# Patient Record
Sex: Male | Born: 1955 | Race: White | Hispanic: No | Marital: Married | Smoking: Never smoker
Health system: Southern US, Community
[De-identification: ages and names within clinical notes are randomized; demographics above are authoritative.]

## PROBLEM LIST (undated history)

## (undated) DIAGNOSIS — Z9889 Other specified postprocedural states: Secondary | ICD-10-CM

## (undated) DIAGNOSIS — I2510 Atherosclerotic heart disease of native coronary artery without angina pectoris: Secondary | ICD-10-CM

## (undated) DIAGNOSIS — K219 Gastro-esophageal reflux disease without esophagitis: Secondary | ICD-10-CM

## (undated) DIAGNOSIS — M1990 Unspecified osteoarthritis, unspecified site: Secondary | ICD-10-CM

## (undated) DIAGNOSIS — T8859XA Other complications of anesthesia, initial encounter: Secondary | ICD-10-CM

## (undated) DIAGNOSIS — T4145XA Adverse effect of unspecified anesthetic, initial encounter: Secondary | ICD-10-CM

## (undated) DIAGNOSIS — E669 Obesity, unspecified: Secondary | ICD-10-CM

## (undated) DIAGNOSIS — G4733 Obstructive sleep apnea (adult) (pediatric): Secondary | ICD-10-CM

## (undated) DIAGNOSIS — R112 Nausea with vomiting, unspecified: Secondary | ICD-10-CM

## (undated) DIAGNOSIS — C801 Malignant (primary) neoplasm, unspecified: Secondary | ICD-10-CM

## (undated) DIAGNOSIS — K5520 Angiodysplasia of colon without hemorrhage: Secondary | ICD-10-CM

## (undated) DIAGNOSIS — T7840XA Allergy, unspecified, initial encounter: Secondary | ICD-10-CM

## (undated) DIAGNOSIS — E119 Type 2 diabetes mellitus without complications: Secondary | ICD-10-CM

## (undated) DIAGNOSIS — J45909 Unspecified asthma, uncomplicated: Secondary | ICD-10-CM

## (undated) DIAGNOSIS — I10 Essential (primary) hypertension: Secondary | ICD-10-CM

## (undated) DIAGNOSIS — K580 Irritable bowel syndrome with diarrhea: Secondary | ICD-10-CM

## (undated) HISTORY — DX: Irritable bowel syndrome with diarrhea: K58.0

## (undated) HISTORY — PX: SHOULDER ARTHROSCOPY: SHX128

## (undated) HISTORY — DX: Obesity, unspecified: E66.9

## (undated) HISTORY — PX: JOINT REPLACEMENT: SHX530

## (undated) HISTORY — DX: Allergy, unspecified, initial encounter: T78.40XA

## (undated) HISTORY — DX: Atherosclerotic heart disease of native coronary artery without angina pectoris: I25.10

## (undated) HISTORY — PX: COLONOSCOPY: SHX174

## (undated) HISTORY — DX: Unspecified asthma, uncomplicated: J45.909

## (undated) HISTORY — PX: ESOPHAGEAL DILATION: SHX303

## (undated) HISTORY — DX: Essential (primary) hypertension: I10

## (undated) HISTORY — DX: Obstructive sleep apnea (adult) (pediatric): G47.33

## (undated) HISTORY — DX: Angiodysplasia of colon without hemorrhage: K55.20

---

## 1998-06-04 ENCOUNTER — Ambulatory Visit: Admission: RE | Admit: 1998-06-04 | Discharge: 1998-06-04 | Payer: Self-pay | Admitting: Family Medicine

## 1999-02-19 ENCOUNTER — Observation Stay (HOSPITAL_COMMUNITY): Admission: RE | Admit: 1999-02-19 | Discharge: 1999-02-19 | Payer: Self-pay | Admitting: Orthopedic Surgery

## 1999-04-08 ENCOUNTER — Inpatient Hospital Stay (HOSPITAL_COMMUNITY): Admission: EM | Admit: 1999-04-08 | Discharge: 1999-04-09 | Payer: Self-pay | Admitting: Orthopedic Surgery

## 1999-11-23 ENCOUNTER — Inpatient Hospital Stay (HOSPITAL_COMMUNITY): Admission: RE | Admit: 1999-11-23 | Discharge: 1999-11-25 | Payer: Self-pay | Admitting: Orthopedic Surgery

## 1999-11-24 ENCOUNTER — Encounter: Payer: Self-pay | Admitting: General Surgery

## 2001-02-03 ENCOUNTER — Encounter: Admission: RE | Admit: 2001-02-03 | Discharge: 2001-02-03 | Payer: Self-pay | Admitting: Gastroenterology

## 2001-02-03 ENCOUNTER — Encounter: Payer: Self-pay | Admitting: Gastroenterology

## 2001-07-04 ENCOUNTER — Ambulatory Visit (HOSPITAL_COMMUNITY): Admission: RE | Admit: 2001-07-04 | Discharge: 2001-07-04 | Payer: Self-pay | Admitting: Orthopedic Surgery

## 2001-07-04 ENCOUNTER — Encounter (INDEPENDENT_AMBULATORY_CARE_PROVIDER_SITE_OTHER): Payer: Self-pay | Admitting: Specialist

## 2002-05-18 ENCOUNTER — Ambulatory Visit (HOSPITAL_COMMUNITY): Admission: RE | Admit: 2002-05-18 | Discharge: 2002-05-18 | Payer: Self-pay | Admitting: Gastroenterology

## 2002-05-18 ENCOUNTER — Encounter: Payer: Self-pay | Admitting: Gastroenterology

## 2003-10-31 ENCOUNTER — Encounter: Admission: RE | Admit: 2003-10-31 | Discharge: 2003-10-31 | Payer: Self-pay | Admitting: Specialist

## 2003-11-21 ENCOUNTER — Ambulatory Visit (HOSPITAL_BASED_OUTPATIENT_CLINIC_OR_DEPARTMENT_OTHER): Admission: RE | Admit: 2003-11-21 | Discharge: 2003-11-21 | Payer: Self-pay | Admitting: Orthopedic Surgery

## 2006-05-30 ENCOUNTER — Inpatient Hospital Stay (HOSPITAL_COMMUNITY): Admission: RE | Admit: 2006-05-30 | Discharge: 2006-06-02 | Payer: Self-pay | Admitting: Orthopedic Surgery

## 2009-11-03 ENCOUNTER — Inpatient Hospital Stay (HOSPITAL_COMMUNITY): Admission: RE | Admit: 2009-11-03 | Discharge: 2009-11-06 | Payer: Self-pay | Admitting: Orthopedic Surgery

## 2010-06-01 ENCOUNTER — Encounter: Admission: RE | Admit: 2010-06-01 | Discharge: 2010-06-01 | Payer: Self-pay | Admitting: Surgery

## 2010-06-03 ENCOUNTER — Ambulatory Visit (HOSPITAL_BASED_OUTPATIENT_CLINIC_OR_DEPARTMENT_OTHER): Admission: RE | Admit: 2010-06-03 | Discharge: 2010-06-03 | Payer: Self-pay | Admitting: Surgery

## 2010-07-26 ENCOUNTER — Encounter: Payer: Self-pay | Admitting: Specialist

## 2010-09-15 LAB — DIFFERENTIAL
Basophils Relative: 0 % (ref 0–1)
Eosinophils Absolute: 0.3 10*3/uL (ref 0.0–0.7)
Lymphocytes Relative: 28 % (ref 12–46)
Lymphs Abs: 1.9 10*3/uL (ref 0.7–4.0)
Monocytes Absolute: 0.5 10*3/uL (ref 0.1–1.0)
Monocytes Relative: 8 % (ref 3–12)
Neutro Abs: 4.1 10*3/uL (ref 1.7–7.7)
Neutrophils Relative %: 60 % (ref 43–77)

## 2010-09-15 LAB — COMPREHENSIVE METABOLIC PANEL
ALT: 27 U/L (ref 0–53)
BUN: 14 mg/dL (ref 6–23)
Calcium: 9.3 mg/dL (ref 8.4–10.5)
Chloride: 104 mEq/L (ref 96–112)
Creatinine, Ser: 0.77 mg/dL (ref 0.4–1.5)
GFR calc Af Amer: >60 mL/min (ref >60)
Sodium: 140 mEq/L (ref 135–145)

## 2010-09-15 LAB — CBC
HCT: 45.5 % (ref 39.0–52.0)
Hemoglobin: 15.3 g/dL (ref 13.0–17.0)
MCHC: 33.6 g/dL (ref 30.0–36.0)
RBC: 5.23 MIL/uL (ref 4.22–5.81)
WBC: 6.8 10*3/uL (ref 4.0–10.5)

## 2010-09-22 LAB — CBC
HCT: 32.5 % — ABNORMAL LOW (ref 39.0–52.0)
HCT: 34.6 % — ABNORMAL LOW (ref 39.0–52.0)
HCT: 47 % (ref 39.0–52.0)
Hemoglobin: 11.7 g/dL — ABNORMAL LOW (ref 13.0–17.0)
Hemoglobin: 15.8 g/dL (ref 13.0–17.0)
MCHC: 33.9 g/dL (ref 30.0–36.0)
MCV: 90.4 fL (ref 78.0–100.0)
MCV: 90.6 fL (ref 78.0–100.0)
MCV: 89.7 fL (ref 78.0–100.0)
Platelets: 154 10*3/uL (ref 150–400)
RBC: 3.82 MIL/uL — ABNORMAL LOW (ref 4.22–5.81)
RBC: 4.08 MIL/uL — ABNORMAL LOW (ref 4.22–5.81)
RDW: 13.1 % (ref 11.5–15.5)
RDW: 13.2 % (ref 11.5–15.5)
WBC: 11.6 10*3/uL — ABNORMAL HIGH (ref 4.0–10.5)
WBC: 7.3 10*3/uL (ref 4.0–10.5)

## 2010-09-22 LAB — PROTIME-INR
INR: 1.14 (ref 0.00–1.49)
INR: 1.84 — ABNORMAL HIGH (ref 0.00–1.49)
INR: 0.95 (ref 0.00–1.49)
Prothrombin Time: 14.5 seconds (ref 11.6–15.2)
Prothrombin Time: 17.9 seconds — ABNORMAL HIGH (ref 11.6–15.2)
Prothrombin Time: 12.6 seconds (ref 11.6–15.2)

## 2010-09-22 LAB — COMPREHENSIVE METABOLIC PANEL
ALT: 31 U/L (ref 0–53)
BUN: 12 mg/dL (ref 6–23)
CO2: 26 mEq/L (ref 19–32)
Calcium: 9.3 mg/dL (ref 8.4–10.5)
Chloride: 107 mEq/L (ref 96–112)
Creatinine, Ser: 0.75 mg/dL (ref 0.4–1.5)
GFR calc non Af Amer: >60 mL/min (ref >60)

## 2010-09-22 LAB — BASIC METABOLIC PANEL
BUN: 10 mg/dL (ref 6–23)
CO2: 26 mEq/L (ref 19–32)
Calcium: 8.3 mg/dL — ABNORMAL LOW (ref 8.4–10.5)
Chloride: 104 mEq/L (ref 96–112)
Creatinine, Ser: 0.74 mg/dL (ref 0.4–1.5)
Creatinine, Ser: 0.76 mg/dL (ref 0.4–1.5)
GFR calc Af Amer: >60 mL/min (ref >60)
GFR calc non Af Amer: >60 mL/min (ref >60)
Potassium: 4.1 mEq/L (ref 3.5–5.1)

## 2010-09-22 LAB — URINALYSIS, ROUTINE W REFLEX MICROSCOPIC
Bilirubin Urine: NEGATIVE
Glucose, UA: NEGATIVE mg/dL
Glucose, UA: NEGATIVE mg/dL
Hgb urine dipstick: NEGATIVE
Ketones, ur: NEGATIVE mg/dL
Nitrite: NEGATIVE
Protein, ur: NEGATIVE mg/dL
Specific Gravity, Urine: 1.023 (ref 1.005–1.030)
Urobilinogen, UA: 0.2 mg/dL (ref 0.0–1.0)
Urobilinogen, UA: 0.2 mg/dL (ref 0.0–1.0)

## 2010-09-22 LAB — APTT: aPTT: 26 seconds (ref 24–37)

## 2010-09-22 LAB — TYPE AND SCREEN: ABO/RH(D): O POS

## 2010-09-22 LAB — URINE MICROSCOPIC-ADD ON

## 2010-11-09 ENCOUNTER — Other Ambulatory Visit: Payer: Self-pay | Admitting: Gastroenterology

## 2010-11-20 NOTE — Op Note (Signed)
[facility]  Patient:    Jordan Mcconnell, Jordan Mcconnell Visit Number: [id number] MRN: [id number]          Service Type: DSU Location: DAY Attending Physician:  Marlowe Kays Page Dictated by:   Illene Labrador. Aplington, M.D. Proc. Date: 07/04/01 Admit Date:  07/04/2001                             Operative Report  PREOPERATIVE DIAGNOSES:  1. 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  2. Chronic plantar fasciitis right foot.  POSTOPERATIVE DIAGNOSES:  1. 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  2. Chronic plantar fasciitis right foot.  OPERATION PERFORMED:  1. 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  2. Reduce plantar fascia heel, right foot.  SURGEON:  Illene Labrador. Aplington, M.D.  ASSISTANT:  Nurse.  ANESTHESIA:  General.  PATHOLOGY AND JUSTIFICATION FOR PROCEDURE:  He has had the two above problems which have failed to respond to nonsurgical treatment and consequently he is here today for the above mentioned surgery.  DESCRIPTION OF PROCEDURE:  Satisfactory general anesthesia, pneumatic tourniquet, duraprep the foot and ankle which was draped in a sterile field. I first made a 2 cm incision over the posterior longitudinal arch of the foot overlying the heel medially. The plantar fascia was identified and delineate from the underlying soft tissue. Superiorly I also delineated it from the overlying muscle. With small scissors, I then released a very thickened plantar fascia across the width of the heel until both ______ with the freer elevator and by admitting my finger into the released area and no residual bands of plantar fascia could be felt. The wound was then irrigated with sterile saline and the soft tissue was infiltrated with 0.5% plain Marcaine. The subcutaneous tissue was closed with interrupted 3-0 Vicryl and the skin with interrupted 4-0 nylon mattress sutures. I then made a dorsal web splitting incision between the second and  third toes and with blunt dissection isolated the very scarred common digital nerve and its two branches. Remnants of steroid material were present. The whole area was a mass of congealed tissue. I grasped this congealed tissue with Allis clamp and then began dissecting out soft tissue distally to each tail using a combination of Bovie and small scissors. I then freed up the mass of tissue between the metatarsal heads and excised it at as far proximally as possible with the cutting cautery. I then released the tourniquet. There was very minimal bleeding and all bleeders that were discernible were coagulated. I then irrigated the wound well and placed Gelfoam in the base of the wound and infiltrated the soft tissue with 0.5% plain Marcaine once again. The subcutaneous tissue was closed with interrupted 3-0 Vicryl and skin with interrupted 4-0 nylon mattress sutures. Betadine Adaptic dry sterile dressing were applied. The patient tolerated the procedure well and was taken to the recovery room in satisfactory condition with no known complications. Dictated by:   Illene Labrador. Aplington, M.D. Attending Physician:  Joaquin Courts DD:  07/04/01 TD:  07/04/01 Job: [id number]

## 2010-11-20 NOTE — Op Note (Signed)
NAME:  Jordan Mcconnell, Jordan Mcconnell NO.:  [id number]   MEDICAL RECORD NO.:  [id number]          PATIENT TYPE:  INP   LOCATION:  0009                         FACILITY:  [facility]   PHYSICIAN:  Ollen Gross, M.D.    DATE OF BIRTH:  1955/08/19   DATE OF PROCEDURE:  05/30/2006  DATE OF DISCHARGE:                               OPERATIVE REPORT   PREOPERATIVE DIAGNOSIS:  Osteoarthritis right knee.   POSTOP DIAGNOSIS:  Osteoarthritis right knee.   PROCEDURE:  Right total knee arthroplasty.   SURGEON:  Dr. Lequita Halt   ASSISTANT:  Alexzandrew L. Julien Girt, P.A.   ANESTHESIA:  General with postop Marcaine pain pump.   ESTIMATED BLOOD LOSS:  Minimal.   DRAINS:  Hemovac x1.   TOURNIQUET TIME:  36 minutes at 300 mmHg.   COMPLICATIONS:  None.   CONDITION:  Stable to recovery.   CLINICAL NOTE:  Jordan Mcconnell is a 55 year old male with severe end-stage  arthritis of the left knee with progressively worsening symptoms.  He  has failed nonoperative management and presents now for total knee  arthroplasty.   PROCEDURE IN DETAIL:  After the successful initiation of general  anesthetic a tourniquet placed high on the right thigh and right lower  extremity prepped and draped in the usual sterile fashion.  Extremities  wrapped in Esmarch, knee flexed, tourniquet inflated to 300 mmHg.  Midline incision made with 10 blade through subcutaneous tissue to the  level of the extensor mechanism.  Fresh blade is used to make a medial  parapatellar arthrotomy and the soft tissue over the proximal medial  tibia subperiosteally elevated to the joint line with a knife and into  the semimembranosus bursa with a Cobb elevator.  Soft tissue laterally  is elevated with attention being paid to avoid the patellar tendon on  tibial tubercle.  The patella subluxed laterally, knee flexed 90  degrees, ACL and PCL removed.  Drill was used to create a starting hole  in the distal femur and canal was thoroughly irrigated.   The 5 degrees  right valgus alignment guide is placed and referencing off the posterior  condyles rotations marked and the block pinned to remove 11 mm of the  distal femur as he had a flexion contracture.  The distal femoral  resection is made with an oscillating saw.  Sizing blocks placed, size 4  is most appropriate.  The size 4 cutting block is placed with rotation  marked off the epicondylar axis.  The anterior-posterior chamfer cuts  were then made.   The tibia subluxed forward and the menisci are removed.  The  extramedullary tibial alignment guide is placed referencing proximally  at the medial aspect of the tibial tubercle and distally along the  second metatarsal axis and tibial crest.  The block is pinned to remove  10 mm off the non deficient lateral side.  Tibial resection is made with  an oscillating saw.  The size 4 is the most appropriate tibial component  and the proximal tibia is prepared with modular drill and keel punch for  size 4.  The femoral preparation is then  completed with the  intercondylar cut.   The size 4 mobile bearing tibial trial, size 4 posterior stabilized  femoral trial, and a 10 mm posterior stabilized rotating platform insert  trial placed.  With a 10 full extension was achieved with excellent  varus and valgus balance throughout full range of motion.  The patella  was then everted and thickness measured to be 24 mm.  Freehand resection  is taken to 13 mm, 41 template is placed, lug holes were drilled, trial  patella was placed and it tracks normally.  Osteophytes were then  removed off the posterior femur with the trial in place.  All trials  removed and the cut bone surfaces are prepared with pulsatile lavage.  Cement was mixed and once ready for implantation the size 4 mobile  bearing tibial tray, size 4 posterior stabilized femur, and 41 patella  are cemented into place. The patella is held with the clamp.  Trial 10-  mm insert is placed, knee  held in full extension and all extruded cement  removed.  Once the cement fully hardened then the permanent 10 mm  posterior stabilized rotating platform insert is placed into the tibial  tray.  Wound was copiously irrigated with saline solution and the  extensor mechanism closed over Hemovac drain with interrupted #1 PDS.  Flexion against gravity to 135 degrees.  Tourniquet released with total  time of 36 minutes.  Subcu is then closed interrupted 2-0 Vicryl and  subcuticular running 4-0 Monocryl.  Catheter for Marcaine pain pump is  placed and the pump initiated.  The Hemovac then hooked to suction.  Steri-Strips and a bulky sterile dressing are applied.  He is then  placed into a knee immobilizer, awakened and transferred to recovery in  stable condition.      Ollen Gross, M.D.  Electronically Signed     FA/MEDQ  D:  05/30/2006  T:  05/30/2006  Job:  [id number]

## 2010-11-20 NOTE — Discharge Summary (Signed)
[facility]  Patient:    NIZAR, CUTLER                     MRN: [id number] Adm. Date:  [id number] Disc. Date: [id number] Attending:  Marlowe Kays Page Dictator:   Ralene Bathe, P.A. CC:         Gita Kudo, M.D.             Dr. Annitta Needs                           Discharge Summary  ADMISSION DIAGNOSIS:  Tear of right biceps tendon.  DISCHARGE DIAGNOSES: 1. Status post right biceps tendon repair. 2. 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  CONSULTS:  Gita Kudo, M.D., general surgery.  OPERATIONS:  Repair of right biceps tendon.  Surgeon:  Illene Labrador. Aplington, M.D.  Assistant:  Grayland Jack, P.A.  Under general anesthesia.  HISTORY OF PRESENT ILLNESS:  This is a 55 year old male who had an initial right rotator cuff tear some time ago.  Noted to have a rupture of the long hand of the right biceps tendon, which was repaired.  He has since gone on to rerupture that biceps tendon.  This would be from the distal attachment.  At this time, we have indicated to have surgical repair of the right biceps tendon.  HOSPITAL COURSE:  The patient was admitted, underwent the above-named procedure, and tolerated this well.  All appropriate IV antibiotics and analgesics were provided.  Postoperatively, the patient was placed in a posterior splint and on all appropriate IV antibiotics and analgesics.  Of note, Illene Labrador. Aplington, M.D., noticed an abnormally large thyroid on his preoperative exam and consulted Gita Kudo, M.D., for general surgery for evaluation.  An ultrasound was ordered, which did suggest an adenoma of the right lobe.  This was about 4 mm in size.  Dr. Maryagnes Amos reviewed and felt that no surgical treatment was indicated, however, a thyroid panel should be ordered.  This can be done on an outpatient basis.  On Nov 25, 1999, he was doing much better with his pain control.  He was afebrile, vital signs were stable, and  neurovascularly intact to the right lower extremity.  He was felt to be medically stable for discharge to home.  LABORATORY DATA:  Laboratories not found in the chart, x-rays not found in the chart, and EKG not found in the chart at the time of dictation.  CONDITION ON DISCHARGE:  Stable and improved.  DISPOSITION:  The patient is being discharged to home to the care of his family.  FOLLOW-UP:  He will follow up in 10 days; call for a time.  SPECIAL INSTRUCTIONS:  Keep the posterior splint on.  May change the proximal dressing p.r.n.  Will order a TSH and a free T4 prior to his discharge and have the results sent to Dr. Annitta Needs and Dr. Maryagnes Amos.  Will also have ultrasound report sent to both offices.  DISCHARGE MEDICATIONS:  Prescriptions are given for Percocet #40 one to two every four to six hours p.r.n. pain and Robaxin 500 mg one every eight hours p.r.n. spasm.  Resume his home medications.  DIET:  Resume his home diet. DD:  11/25/99 TD:  11/28/99 Job: [id number]

## 2010-11-20 NOTE — H&P (Signed)
NAME:  Jordan Mcconnell, Jordan Mcconnell NO.:  [id number]   MEDICAL RECORD NO.:  [id number]          PATIENT TYPE:  INP   LOCATION:  NA                           FACILITY:  [facility]   PHYSICIAN:  Ollen Gross, M.D.    DATE OF BIRTH:  07-25-1955   DATE OF ADMISSION:  05/30/2006  DATE OF DISCHARGE:                              HISTORY & PHYSICAL   Date of office visit, history and physical May 24, 2006.   CHIEF COMPLAINT:  Right knee pain.   HISTORY OF PRESENT ILLNESS:  The patient is a 55 year old male who has  been by Dr. Lequita Halt for ongoing progressive problems with regards to his  right knee.  It has progressively gotten worse.  He is known to have  significant arthritis found in the office.  He has reached a point where  he could benefit from undergoing surgical intervention.  Risks and  benefits have been discussed and the patient has elected to proceed with  surgery.   ALLERGIES:  PENICILLIN childhood reaction.   CURRENT MEDICATIONS:  None.   PAST MEDICAL HISTORY:  1. Borderline hypertension.  2. Sleep apnea but does not require a machine.   PAST SURGICAL HISTORY:  He has had three right shoulder surgeries, three  left shoulder surgeries and also two surgeries on his knee for meniscal  tears.   FAMILY HISTORY:  Father is 86 with history of colon cancer.  Grandfather  with lung cancer.  Grandmother with asthma.  Mother living at age 75  healthy.   SOCIAL HISTORY:  Married, two children.  Denies use of tobacco products  or alcohol products.   REVIEW OF SYSTEMS:  GENERAL:  No fever, chills or night sweats.  NEUROLOGICAL:  No seizures, syncope or paralysis.  RESPIRATORY:  No  shortness of breath, productive cough or hemoptysis.  CARDIOVASCULAR:  No chest pain, angina or orthopnea.  GI:  No nausea, vomiting, diarrhea  or constipation.  GU:  No dysuria, hematuria or discharge.  MUSCULOSKELETAL:  Right knee.   PHYSICAL EXAMINATION:  VITAL SIGNS:  Pulse 68,  respirations 12, blood  pressure 130/80.  GENERAL:  A 55 year old white male well-developed, well-nourished in no  acute distress.  Good historian, alert, oriented and cooperative.  HEENT:  Normocephalic and atraumatic.  Pupils are round and reactive.  Oropharynx clear.  EOMs intact.  Neck is supple.  CHEST:  Clear anterior and posterior chest wall.  HEART:  Regular rhythm.  No murmur.  ABDOMEN:  Soft and nontender.  Bowel sounds present.  RECTAL/BREASTS/GENITALIA:  Not done, not pertinent to present illness.  EXTREMITIES:  No instability.  Marked crepitus noted on passive range of  motion.  Tender to palpation medial and lateral.   IMPRESSION:  1. Osteoarthritis right knee.  2. Borderline hypertension.  3. Sleep apnea but does not require a machine.   PLAN:  The patient is admitted to [facility] to undergo right  total knee arthroplasty.  Surgery will be performed by Dr. Ollen Gross.      Alexzandrew L. Julien Girt, P.A.      Ollen Gross, M.D.  Electronically Signed    ALP/MEDQ  D:  05/29/2006  T:  05/29/2006  Job:  [id number]

## 2010-11-20 NOTE — Op Note (Signed)
NAME:  Jordan Mcconnell, Jordan Mcconnell                        ACCOUNT NO.:  [id number]   MEDICAL RECORD NO.:  [id number]                   PATIENT TYPE:  AMB   LOCATION:  NESC                                 FACILITY:  [facility]   PHYSICIAN:  Marlowe Kays, M.D.               DATE OF BIRTH:  [birth year]   DATE OF PROCEDURE:  11/20/2003  DATE OF DISCHARGE:                                 OPERATIVE REPORT   PREOPERATIVE DIAGNOSES:  1. Torn medial meniscus.  .  2. Degenerative arthritis.  3. Loose cartilaginous fragments, right knee.   POSTOPERATIVE DIAGNOSES:  1. Torn medial meniscus.  .  2. Degenerative arthritis.  3. Loose cartilaginous fragments, right knee.   OPERATION/PROCEDURE:  Right knee arthroscopy with:  1. Partial medial meniscectomy.  2. Debridement of medial femoral condyle.  3. Removal of loose cartilaginous fragments.   SURGEON:  Marlowe Kays, M.D.   ASSISTANT:  Nurse.   ANESTHESIA:  General.   PATHOLOGY AND INDICATIONS FOR THE PROCEDURE:  He has had prior knee  arthroscopy by me.  Three weeks ago, he bent over to pick up a ball and  there was a pop in his knee and he has had medial joint line pain since with  an MRI demonstrating the above diagnoses.  This was all confirmed and  surgery is discussed below.   DESCRIPTION OF PROCEDURE:  Satisfactory general anesthesia.  Pneumatic  tourniquet applied.  Stabilizer.  The right knee was  prepped with DuraPrep  and draped in sterile field.  Was esmarched out nonsterilely.  Ace wrap to  the left leg.  Superior medial saline inflow.  First through an  anterolateral portal, medial compartment in the knee joint was evaluated.  Looking at the joint, there were some cartilaginous fragments floating which  I evacuated.  The origin of the medial femoral condyle with at least one  major crater there going down to a thin layer of articular cartilage base  posteriorly.  It was almost down to bone.  The medial femoral condyle was  smoothed  down.  The medial compartment of his knee was extremely tight which  is most likely where I think he has had a shearing-type tear of the  posterior horn of the medial meniscus which was pictured and I resected back  to a stable rim with small baskets and shaved it down until smooth with the  3.5 shaver.  Also did a little smoothing off of the medial tibial plateau.  Looking at the medial joint and suprapatellar area, there was full-thickness  wear of the patella but nothing that was shaveable.  On reversing portals,  lateral compartment of the knee joint had some minimal arthritic changes.  The lateral meniscus looked good.  No arthroscopic procedure was indicated.  The knee joint was then irrigated until clear and all fluid possible  removed.  The two anterior portals were closed with 4-0 nylon and 20 mL  of  0.5% Marcaine with adrenalin were then instilled through the inferior  apparatus which was removed and this portal was  closed with 4-0 nylon as well.  Betadine, Adaptic and dry sterile dressing  were applied.  The tourniquet was released.  He tolerated the procedure  well.  He was taken to the recovery room in satisfactory condition with no  known complications.                                               Marlowe Kays, M.D.    JA/MEDQ  D:  11/21/2003  T:  11/21/2003  Job:  [id number]

## 2010-11-20 NOTE — Discharge Summary (Signed)
NAME:  Jordan Mcconnell, EASTHAM NO.:  [id number]   MEDICAL RECORD NO.:  [id number]          PATIENT TYPE:  INP   LOCATION:  1515                         FACILITY:  [facility]   PHYSICIAN:  Ollen Gross, M.D.    DATE OF BIRTH:  May 03, 1956   DATE OF ADMISSION:  05/30/2006  DATE OF DISCHARGE:  06/02/2006                               DISCHARGE SUMMARY   ADMITTING DIAGNOSES:  1. Osteoarthritis right knee.  2. Borderline hypertension.  3. Sleep apnea (but does not require a machine).   DISCHARGE DIAGNOSES:  1. Osteoarthritis right knee, status post right total knee replacement      arthroplasty.  2. Borderline hypertension.  3. Sleep apnea (but does not require a machine).   PROCEDURE:  May 30, 2006, right total knee surgery Dr. Lequita Halt,  assistant Avel Peace, P.A.C.   ANESTHESIA:  1. General.  2. Postop, Marcaine pain pump.   CONSULTS:  None.   BRIEF HISTORY:  [name] is a 55 year old male with severe end-stage  arthritis of the left knee with progressive worsening symptoms, failed  nonoperative management, now presents for total knee.   LABORATORY DATA:  Preop CBC:  Hemoglobin of 15.5, hematocrit of 44.3,  white cell count of 6.4.  Post:  Hemoglobin 13.6.  Last noted H&H:  Were  12.4 and 36.6.  A PT and PTT preop 13.1 and 28, respectively; INR 1.  Serial pro. Times followed.  Last PT and INR 21.4 and 1.8.  Chem panel  on admission all within normal limits.  Serial BMETs were followed.  Electrolytes remained within normal limits.  Urinalysis preop:  Negative.  Blood Group Type:  O positive.   Two view chest May 25, 2006:  No active cardiopulmonary disease.   EKG May 25, 2006:  Normal sinus rhythm, normal EKG, no significant  change when compared to July 03, 2001, confirmed by Dr. Susa Griffins.   HOSPITAL COURSE:  The patient admitted to [facility],  tolerated procedure well, later transferred to the recovery room then  orthopedic  floor.  Started on PCA and p.o. analgesics for pain control  following surgery.  Following surgery, given 24 hours of postop IV  antibiotics.  Did not get much sleep the night of surgery.  Doing pretty  well on his pain control.  Hemovac drain placed at time of surgery was  pulled on Day 1 without difficulty, started getting up with PT.  By Day  2, he was doing better,  moving his bowels, IVs and Foley catheter was  discontinued, dressing was changed, incision looked good, progressing  well with physical therapy by this time about 140 feet.  Continued to  progress well and by Day 3, June 02, 2006, doing well, tolerating  meds and discharged home.   DISCHARGE PLAN:  1. Patient discharged home on June 02, 2006.  2. Discharge diagnoses:  Please see above.  3. Discharge meds:  Coumadin, Percocet, Robaxin.  4. Diet as tolerated.  5. Follow up 2 weeks.  Call the office for an appointment at [phone],      to arrange appointment time  and follow up.   ACTIVITY:  1. Weight bearing as tolerated to the right lower extremity.  2. Home Health PT.  3. Home Health nursing.  4. Total knee protocol.   DISPOSITION:  Home.   CONDITION UPON DISCHARGE:  Improved.      Alexzandrew L. Julien Girt, P.A.      Ollen Gross, M.D.  Electronically Signed    ALP/MEDQ  D:  06/02/2006  T:  06/02/2006  Job:  [id number]   cc:   Ollen Gross, M.D.  Fax: [phone]

## 2013-04-04 ENCOUNTER — Telehealth: Payer: Self-pay | Admitting: Cardiology

## 2013-04-04 NOTE — Telephone Encounter (Signed)
Will forward to dr/cma 

## 2013-04-04 NOTE — Telephone Encounter (Signed)
Spoke with pt and he is set up to see [name] for a nurse visit on 04/12/2013

## 2013-04-04 NOTE — Telephone Encounter (Signed)
New problem   Pt need to speak to nurse concerning his cpap download and bp reading. Please call pt.

## 2013-04-12 ENCOUNTER — Encounter: Payer: Self-pay | Admitting: *Deleted

## 2013-04-12 ENCOUNTER — Ambulatory Visit (INDEPENDENT_AMBULATORY_CARE_PROVIDER_SITE_OTHER): Payer: PRIVATE HEALTH INSURANCE | Admitting: *Deleted

## 2013-04-12 ENCOUNTER — Encounter: Payer: Self-pay | Admitting: General Surgery

## 2013-04-12 VITALS — BP 122/88 | HR 76 | Wt 286.6 lb

## 2013-04-12 DIAGNOSIS — G4733 Obstructive sleep apnea (adult) (pediatric): Secondary | ICD-10-CM

## 2013-04-12 DIAGNOSIS — I10 Essential (primary) hypertension: Secondary | ICD-10-CM

## 2013-04-12 NOTE — Progress Notes (Signed)
Patient came in today for a blood pressure check and to download his CPAP machine. 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 Spoke with Danielle C. CMA regarding download. She tried to download but with new program unable to download. Advised patient to go to [facility] to have downloaded. Patient agreed. Reviewed medications and checked blood pressure 122/88. Discussed with Dr Mayford Knife, no changes in medications at this time. Per Dr Malachy Mood recommendations patient needs to get an at home blood pressure monitor and check once a day for 1 week and call in with readings. Patient verbalized understanding.

## 2013-04-30 ENCOUNTER — Other Ambulatory Visit: Payer: Self-pay | Admitting: Cardiology

## 2013-05-08 ENCOUNTER — Encounter: Payer: Self-pay | Admitting: Cardiology

## 2013-05-08 DIAGNOSIS — G4733 Obstructive sleep apnea (adult) (pediatric): Secondary | ICD-10-CM | POA: Insufficient documentation

## 2013-05-08 DIAGNOSIS — I10 Essential (primary) hypertension: Secondary | ICD-10-CM | POA: Insufficient documentation

## 2013-05-09 ENCOUNTER — Encounter: Payer: Self-pay | Admitting: Cardiology

## 2013-05-09 ENCOUNTER — Ambulatory Visit (INDEPENDENT_AMBULATORY_CARE_PROVIDER_SITE_OTHER): Payer: PRIVATE HEALTH INSURANCE | Admitting: Cardiology

## 2013-05-09 ENCOUNTER — Encounter (INDEPENDENT_AMBULATORY_CARE_PROVIDER_SITE_OTHER): Payer: Self-pay

## 2013-05-09 VITALS — BP 120/82 | HR 78 | Ht 71.0 in | Wt 287.8 lb

## 2013-05-09 DIAGNOSIS — G4733 Obstructive sleep apnea (adult) (pediatric): Secondary | ICD-10-CM

## 2013-05-09 DIAGNOSIS — I10 Essential (primary) hypertension: Secondary | ICD-10-CM

## 2013-05-09 DIAGNOSIS — E669 Obesity, unspecified: Secondary | ICD-10-CM

## 2013-05-09 NOTE — Patient Instructions (Signed)
Your physician recommends that you schedule a follow-up appointment in: 6 months with Dr. Turner  

## 2013-05-09 NOTE — Progress Notes (Signed)
  [address] [place], [place]  [address] Phone: [phone] Fax:  [phone]  Date:  05/09/2013   ID:  Jordan Mcconnell, DOB 05-22-1956, MRN [id number]  PCP:  No primary provider on file.  Cardiologist:  Armanda Magic, MD     History of Present Illness: Jordan Mcconnell is a 56 y.o. male with a history of OSA, HTN and obesity who presents today for followup.  He tolerates his CPAP well.  He tolerates his nasal pillow mask and feels his pressure is adequate. He feels he does not sleep as well as he used to which he thinks is due to stress and waiting for his daughter to get home.   He doesn't always feel rested in the am and has no daytime sleepiness but gets very tired.   Wt Readings from Last 3 Encounters:  04/12/13 286 lb 9.6 oz (130.001 kg)     Past Medical History  Diagnosis Date  . OSA (obstructive sleep apnea)   . HTN (hypertension)   . Obesity     Current Outpatient Prescriptions  Medication Sig Dispense Refill  . hydrochlorothiazide (HYDRODIURIL) 25 MG tablet TAKE 1 TABLET BY MOUTH EVERY DAY  30 tablet  2  . omeprazole (PRILOSEC) 40 MG capsule Take 40 mg by mouth daily.      Marland Kitchen UNABLE TO FIND Inhale 12 cm into the lungs at bedtime. Med Name: CPAP       No current facility-administered medications for this visit.    Allergies:    Allergies  Allergen Reactions  . Penicillins   . Sulfa Antibiotics     Social History:  The patient  reports that he has never smoked. He does not have any smokeless tobacco history on file. He reports that he does not drink alcohol or use illicit drugs.   Family History:  The patient's family history is not on file.   ROS:  Please see the history of present illness.      All other systems reviewed and negative.   PHYSICAL EXAM: VS:  There were no vitals taken for this visit. Well nourished, well developed, in no acute distress HEENT: normal Neck: no JVD Cardiac:  normal S1, S2; RRR; no murmur Lungs:  clear to  auscultation bilaterally, no wheezing, rhonchi or rales Abd: soft, nontender, no hepatomegaly Ext: no edema Skin: warm and dry Neuro:  CNs 2-12 intact, no focal abnormalities noted       ASSESSMENT AND PLAN:  1. OSA on CPAP - his download today showed excellent compliance and an AHI of 4.5/hr  - continue current CPAP setting of 12cm H2O 2. HTN- controlled  - continue HCTZ  3. Obesity  - His exercise is limited at present due to being in a leg boot due to tendon injury  Followup in 6 months  Signed, Armanda Magic, MD 05/09/2013 10:51 AM

## 2013-05-22 ENCOUNTER — Encounter: Payer: Self-pay | Admitting: Cardiology

## 2013-08-03 ENCOUNTER — Other Ambulatory Visit: Payer: Self-pay | Admitting: *Deleted

## 2013-08-03 MED ORDER — HYDROCHLOROTHIAZIDE 25 MG PO TABS: ORAL_TABLET | ORAL | Status: DC

## 2013-10-17 ENCOUNTER — Other Ambulatory Visit: Payer: Self-pay | Admitting: Gastroenterology

## 2013-10-30 ENCOUNTER — Encounter (HOSPITAL_COMMUNITY)
Admission: RE | Disposition: A | Discharge status: Home / Self Care | Payer: Self-pay | Source: Ambulatory Visit | Attending: Gastroenterology

## 2013-10-30 ENCOUNTER — Encounter (HOSPITAL_COMMUNITY): Payer: Self-pay | Admitting: *Deleted

## 2013-10-30 ENCOUNTER — Ambulatory Visit (HOSPITAL_COMMUNITY)
Admission: RE | Admit: 2013-10-30 | Discharge: 2013-10-30 | Disposition: A | Discharge status: Home / Self Care | Payer: PRIVATE HEALTH INSURANCE | Source: Ambulatory Visit | Attending: Gastroenterology | Admitting: Gastroenterology

## 2013-10-30 DIAGNOSIS — Z882 Allergy status to sulfonamides status: Secondary | ICD-10-CM | POA: Insufficient documentation

## 2013-10-30 DIAGNOSIS — R1310 Dysphagia, unspecified: Secondary | ICD-10-CM | POA: Insufficient documentation

## 2013-10-30 DIAGNOSIS — Z883 Allergy status to other anti-infective agents status: Secondary | ICD-10-CM | POA: Insufficient documentation

## 2013-10-30 DIAGNOSIS — Z96659 Presence of unspecified artificial knee joint: Secondary | ICD-10-CM | POA: Insufficient documentation

## 2013-10-30 DIAGNOSIS — K219 Gastro-esophageal reflux disease without esophagitis: Secondary | ICD-10-CM | POA: Insufficient documentation

## 2013-10-30 HISTORY — PX: ESOPHAGOGASTRODUODENOSCOPY: SHX5428

## 2013-10-30 SURGERY — EGD (ESOPHAGOGASTRODUODENOSCOPY)
Anesthesia: Moderate Sedation

## 2013-10-30 MED ORDER — BUTAMBEN-TETRACAINE-BENZOCAINE 2-2-14 % EX AERO
INHALATION_SPRAY | CUTANEOUS | Status: DC | PRN
  Administered 2013-10-30: 2 via TOPICAL

## 2013-10-30 MED ORDER — SODIUM CHLORIDE 0.9 % IV SOLN
INTRAVENOUS | Status: DC
  Administered 2013-10-30: 08:00:00 via INTRAVENOUS

## 2013-10-30 MED ORDER — FENTANYL CITRATE 0.05 MG/ML IJ SOLN
INTRAMUSCULAR | Status: AC
  Filled 2013-10-30: qty 2

## 2013-10-30 MED ORDER — MIDAZOLAM HCL 10 MG/2ML IJ SOLN
INTRAMUSCULAR | Status: DC | PRN
  Administered 2013-10-30: 8 mg via INTRAVENOUS
  Administered 2013-10-30: 2.5 mg via INTRAVENOUS

## 2013-10-30 MED ORDER — MIDAZOLAM HCL 10 MG/2ML IJ SOLN
INTRAMUSCULAR | Status: AC
  Filled 2013-10-30: qty 2

## 2013-10-30 MED ORDER — FENTANYL CITRATE 0.05 MG/ML IJ SOLN
INTRAMUSCULAR | Status: DC | PRN
  Administered 2013-10-30: 100 ug via INTRAVENOUS

## 2013-10-30 NOTE — Op Note (Signed)
Problem: Chronic gastroesophageal reflux associated with esophageal dysphagia  Endoscopist: Danise EdgeMartin Glendoris Nodarse  Premedication: Versed 7.5 mg. Fentanyl 75 mcg  Procedure: Diagnostic esophagogastroduodenoscopy The patient was placed in the left lateral decubitus position. The Pentax gastroscope was passed through the posterior hypopharynx into the proximal esophagus without difficulty. The hypopharynx, larynx, and vocal cords appeared normal.  Esophagoscopy: The proximal, mid, and lower segments of the esophageal mucosa appear completely normal. The squamocolumnar junction was regular and located at approximately 40 cm from the incisor teeth. There was no endoscopic evidence for the presence of erosive esophagitis, esophageal stricture formation, esophageal obstruction, or Barrett's esophagus. Five biopsies were taken along the length of the esophagus to look for eosinophilic esophagitis.  Gastroscopy: Retroflex view of the gastric cardia and fundus was normal. The gastric body, antrum, and pylorus appeared normal.  Duodenoscopy: The duodenal bulb and descending duodenum appeared normal.  Assessment: Normal esophagogastroduodenoscopy. Random esophageal biopsies performed to look for eosinophilic esophagitis.

## 2013-10-30 NOTE — H&P (Signed)
  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  The patient takes proton pump inhibitor therapy breakfast each morning. He occasionally experiences breakthrough heartburn. He has developed esophageal dysphagia with solid food intake unassociated with odynophagia.  The patient is scheduled to undergo diagnostic esophagogastroduodenoscopy with balloon dilation of the benign stricture at the esophagogastric junction.  Past medical history: Anal sphincterotomy and hemorrhoidectomy. Left knee replacement surgery. Bilateral shoulder surgeries. Right knee replacement surgery. Osteoarthritis  Medication allergies: Sulfa and penicillin  Exam: The patient is alert and lying comfortably on the endoscopy stretcher. Lungs are clear to auscultation. Cardiac exam reveals a regular rhythm. Abdomen is soft and nontender to palpation.  Plan: Proceed with diagnostic esophagogastroduodenoscopy with balloon dilation of the benign stricture at the esophagogastric junction

## 2013-10-30 NOTE — Discharge Instructions (Signed)
Gastrointestinal Endoscopy °Care After °Refer to this sheet in the next few weeks. These instructions provide you with information on caring for yourself after your procedure. Your caregiver may also give you more specific instructions. Your treatment has been planned according to current medical practices, but problems sometimes occur. Call your caregiver if you have any problems or questions after your procedure. °HOME CARE INSTRUCTIONS °· If you were given medicine to help you relax (sedative), do not drive, operate machinery, or sign important documents for 24 hours. °· Avoid alcohol and hot or warm beverages for the first 24 hours after the procedure. °· Only take over-the-counter or prescription medicines for pain, discomfort, or fever as directed by your caregiver. You may resume taking your normal medicines unless your caregiver tells you otherwise. Ask your caregiver when you may resume taking medicines that may cause bleeding, such as aspirin, clopidogrel, or warfarin. °· You may return to your normal diet and activities on the day after your procedure, or as directed by your caregiver. Walking may help to reduce any bloated feeling in your abdomen. °· Drink enough fluids to keep your urine clear or pale yellow. °· You may gargle with salt water if you have a sore throat. °SEEK IMMEDIATE MEDICAL CARE IF: °· You have severe nausea or vomiting. °· You have severe abdominal pain, abdominal cramps that last longer than 6 hours, or abdominal swelling (distention). °· You have severe shoulder or back pain. °· You have trouble swallowing. °· You have shortness of breath, your breathing is shallow, or you are breathing faster than normal. °· You have a fever or a rapid heartbeat. °· You vomit blood or material that looks like coffee grounds. °· You have bloody, black, or tarry stools. °MAKE SURE YOU: °· Understand these instructions. °· Will watch your condition. °· Will get help right away if you are not doing  well or get worse. °Document Released: 02/03/2004 Document Revised: 12/21/2011 Document Reviewed: 09/21/2011 °ExitCare® Patient Information ©2014 [employer]. ° °

## 2013-10-31 ENCOUNTER — Encounter (HOSPITAL_COMMUNITY): Payer: Self-pay | Admitting: Gastroenterology

## 2013-11-15 ENCOUNTER — Other Ambulatory Visit: Payer: Self-pay

## 2013-11-15 MED ORDER — HYDROCHLOROTHIAZIDE 25 MG PO TABS: ORAL_TABLET | ORAL | Status: DC

## 2014-02-21 ENCOUNTER — Encounter: Payer: Self-pay | Admitting: Cardiology

## 2014-02-21 ENCOUNTER — Ambulatory Visit (INDEPENDENT_AMBULATORY_CARE_PROVIDER_SITE_OTHER): Payer: PRIVATE HEALTH INSURANCE | Admitting: Cardiology

## 2014-02-21 VITALS — BP 130/84 | HR 66 | Ht 71.0 in | Wt 260.0 lb

## 2014-02-21 DIAGNOSIS — I10 Essential (primary) hypertension: Secondary | ICD-10-CM

## 2014-02-21 DIAGNOSIS — G4733 Obstructive sleep apnea (adult) (pediatric): Secondary | ICD-10-CM

## 2014-02-21 DIAGNOSIS — E669 Obesity, unspecified: Secondary | ICD-10-CM

## 2014-02-21 NOTE — Patient Instructions (Signed)
Your physician recommends that you continue on your current medications as directed. Please refer to the Current Medication list given to you today.  Once you have your DME download your card for you, see if they will fax a copy over to our office for our records. Thank you  Your physician wants you to follow-up in: 6 months with Dr Sherlyn Lickurner You will receive a reminder letter in the mail two months in advance. If you don't receive a letter, please call our office to schedule the follow-up appointment.

## 2014-02-21 NOTE — Progress Notes (Signed)
  [address] [place], [place]  [address] Phone: [phone] Fax:  [phone]  Date:  02/21/2014   ID:  Jordan Mcconnell, DOB January 05, 1956, MRN [id number]  PCP:  Lenora BoysFRIED, ROBERT L, MD  Cardiologist:  Armanda Magicraci Turner, MD     History of Present Illness: Jordan Mcconnell is a 58 y.o. male with a history of OSA, HTN and obesity who presents today for followup. He tolerates his CPAP well. He tolerates his nasal pillow mask and feels his pressure is adequate. He says that he has lost weight but by our scales today he has gained 10 lbs.  He says that he has been feeling sluggish during the day and does not feel as rested when he gets up. He goes to bed at 11pm and get up at 5:30am.    Wt Readings from Last 3 Encounters:  02/21/14 260 lb (117.935 kg)  10/30/13 250 lb (113.399 kg)  10/30/13 250 lb (113.399 kg)     Past Medical History  Diagnosis Date  . HTN (hypertension)   . Obesity   . OSA (obstructive sleep apnea)     uses CPAP    Current Outpatient Prescriptions  Medication Sig Dispense Refill  . hydrochlorothiazide (HYDRODIURIL) 25 MG tablet TAKE 1 TABLET BY MOUTH EVERY DAY  90 tablet  2   No current facility-administered medications for this visit.    Allergies:    Allergies  Allergen Reactions  . Penicillins   . Sulfa Antibiotics     Social History:  The patient  reports that he has never smoked. He does not have any smokeless tobacco history on file. He reports that he does not drink alcohol or use illicit drugs.   Family History:  The patient's family history includes Colon cancer in his father.   ROS:  Please see the history of present illness.      All other systems reviewed and negative.   PHYSICAL EXAM: VS:  BP 130/84  Pulse 66  Ht 5\' 11"  (1.803 m)  Wt 260 lb (117.935 kg)  BMI 36.28 kg/m2 Well nourished, well developed, in no acute distress HEENT: normal Neck: no JVD Cardiac:  normal S1, S2; RRR; no murmur Lungs:  clear to auscultation bilaterally,  no wheezing, rhonchi or rales Abd: soft, nontender, no hepatomegaly Ext: no edema Skin: warm and dry Neuro:  CNs 2-12 intact, no focal abnormalities noted  ASSESSMENT AND PLAN:  1. OSA on CPAP - tolerating his device well - I wil get a download from his DME 2. HTN- controlled - continue HCTZ  3. Obesity - I have encouraged him to try to walk as much as possible.  His activity is limited by knee pain.  Followup in 6 months   Signed, Armanda Magicraci Turner, MD 02/21/2014 11:44 AM

## 2014-02-25 ENCOUNTER — Encounter: Payer: Self-pay | Admitting: Cardiology

## 2014-03-01 ENCOUNTER — Encounter: Payer: Self-pay | Admitting: General Surgery

## 2014-06-20 ENCOUNTER — Other Ambulatory Visit: Payer: Self-pay | Admitting: Family Medicine

## 2014-06-20 DIAGNOSIS — R0781 Pleurodynia: Secondary | ICD-10-CM

## 2014-06-24 ENCOUNTER — Encounter: Payer: Self-pay | Admitting: Cardiology

## 2014-06-25 ENCOUNTER — Ambulatory Visit
Admission: RE | Admit: 2014-06-25 | Discharge: 2014-06-25 | Disposition: A | Discharge status: Home / Self Care | Payer: PRIVATE HEALTH INSURANCE | Source: Ambulatory Visit | Attending: Family Medicine | Admitting: Family Medicine

## 2014-06-25 DIAGNOSIS — R0781 Pleurodynia: Secondary | ICD-10-CM

## 2014-08-19 NOTE — Progress Notes (Signed)
Cardiology Office Note   Date:  08/20/2014   ID:  Jordan Mcconnell, DOB [birth year], MRN [id number]  PCP:  Lenora Boys, MD  Cardiologist:   Quintella Reichert, MD   Chief Complaint  Patient presents with  . Sleep Apnea  . Hypertension  . Obesity      History of Present Illness: Jordan Mcconnell is a 59 y.o. male with a history of OSA, HTN and obesity who presents today for followup. He tolerates his CPAP well. He tolerates his nasal pillow mask and feels his pressure is adequate.He feels rested in the am sometimes depending on if he has slept well. He has been having some problems with chest pressure recently that seems to be exacerbated by stress.  He describes the chest discomfort as pressure on his chest that lasts about 3 minuets and resolves and there is no radiation of the discomfort.  He lost some weight last fall and was not having SOB but has been eating a lot due to stress and gained weight and noticed that he has more SOB.      Past Medical History  Diagnosis Date  . HTN (hypertension)   . Obesity   . OSA (obstructive sleep apnea)     uses CPAP    Past Surgical History  Procedure Laterality Date  . Joint replacement      bilateral knees  . Shoulder arthroscopy    . Esophagogastroduodenoscopy N/A 10/30/2013    Procedure: ESOPHAGOGASTRODUODENOSCOPY (EGD);  Surgeon: Charolett Bumpers, MD;  Location: [facility];  Service: Endoscopy;  Laterality: N/A;     Current Outpatient Prescriptions  Medication Sig Dispense Refill  . hydrochlorothiazide (HYDRODIURIL) 25 MG tablet TAKE 1 TABLET BY MOUTH EVERY DAY 90 tablet 2   No current facility-administered medications for this visit.    Allergies:   Penicillins and Sulfa antibiotics    Social History:  The patient  reports that he has never smoked. He does not have any smokeless tobacco history on file. He reports that he does not drink alcohol or use illicit drugs.   Family History:  The patient's family history  includes Colon cancer in his father.    ROS:  Please see the history of present illness.   Otherwise, review of systems are positive for none.   All other systems are reviewed and negative.    PHYSICAL EXAM: VS:  BP 138/84 mmHg  Pulse 106  Ht  (1.803 m)  Wt 274 lb (124.286 kg)  BMI 38.23 kg/m2 , BMI Body mass index is 38.23 kg/(m^2). GEN: Well nourished, well developed, in no acute distress HEENT: normal Neck: no JVD, carotid bruits, or masses Cardiac: RRR; no murmurs, rubs, or gallops,no edema  Respiratory:  clear to auscultation bilaterally, normal work of breathing GI: soft, nontender, nondistended, + BS MS: no deformity or atrophy Skin: warm and dry, no rash Neuro:  Strength and sensation are intact Psych: euthymic mood, full affect   EKG:  EKG was ordered today and showed NSR at 90bpm with normal intervals    Recent Labs: No results found for requested labs within last 365 days.    Lipid Panel No results found for: CHOL, TRIG, HDL, CHOLHDL, VLDL, LDLCALC, LDLDIRECT    Wt Readings from Last 3 Encounters:  08/20/14 274 lb (124.286 kg)  02/21/14 260 lb (117.935 kg)  10/30/13 250 lb (113.399 kg)       ASSESSMENT AND PLAN:  1. OSA on CPAP - tolerating his device  well.  His d/l today showed an AHI of 3.3/hr on 12cm H2O and 97% compliant in using more than 4 hours nightly.  He will continue on current setting.   2. HTN- controlled - continue HCTZ  3. Obesity - I have encouraged him to try to walk as much as possible. His activity is limited by knee pain.       4.  Chest pain with normal EKG.  I will get an ETT to rule out ischemia.  He thinks it is related to stress.    Current medicines are reviewed at length with the patient today.  The patient does not have concerns regarding medicines.  The following changes have been made:  no change  Labs/ tests ordered today include: ETT     Disposition:   FU with me in 6 months   Signed, Quintella ReichertURNER,TRACI R,  MD  08/20/2014 3:56 PM    [facility] [address], [place], [place]  [address] Phone: [phone](336) [phone]; Fax: [phone]) [phone]

## 2014-08-20 ENCOUNTER — Encounter: Payer: Self-pay | Admitting: Cardiology

## 2014-08-20 ENCOUNTER — Ambulatory Visit (INDEPENDENT_AMBULATORY_CARE_PROVIDER_SITE_OTHER): Payer: PRIVATE HEALTH INSURANCE | Admitting: Cardiology

## 2014-08-20 VITALS — BP 138/84 | HR 106 | Ht 71.0 in | Wt 274.0 lb

## 2014-08-20 DIAGNOSIS — R079 Chest pain, unspecified: Secondary | ICD-10-CM

## 2014-08-20 DIAGNOSIS — G4733 Obstructive sleep apnea (adult) (pediatric): Secondary | ICD-10-CM

## 2014-08-20 DIAGNOSIS — I10 Essential (primary) hypertension: Secondary | ICD-10-CM

## 2014-08-20 DIAGNOSIS — E669 Obesity, unspecified: Secondary | ICD-10-CM

## 2014-08-20 NOTE — Patient Instructions (Signed)
Your physician recommends that you continue on your current medications as directed. Please refer to the Current Medication list given to you today.  Your physician has requested that you have an exercise tolerance test. For further information please visit [email or web address]. Please also follow instruction sheet, as given.  Your physician wants you to follow-up in: 6 months with Dr. Turner. You will receive a reminder letter in the mail two months in advance. If you don't receive a letter, please call our office to schedule the follow-up appointment.  

## 2014-09-25 ENCOUNTER — Ambulatory Visit: Payer: PRIVATE HEALTH INSURANCE | Admitting: Physician Assistant

## 2014-09-25 ENCOUNTER — Ambulatory Visit (INDEPENDENT_AMBULATORY_CARE_PROVIDER_SITE_OTHER): Payer: PRIVATE HEALTH INSURANCE | Admitting: Physician Assistant

## 2014-09-25 DIAGNOSIS — R079 Chest pain, unspecified: Secondary | ICD-10-CM | POA: Diagnosis not present

## 2014-09-25 NOTE — Addendum Note (Signed)
Addended byAlben Spittle: Kaiyah Eber, Lorin PicketSCOTT T on: 09/25/2014 11:27 AM   Modules accepted: Medications

## 2014-09-25 NOTE — Progress Notes (Signed)
Exercise Treadmill Test  Pre-Exercise Testing Evaluation Rhythm: normal sinus  Rate: 75 bpm     Test  Exercise Tolerance Test Ordering MD: Armanda Magicraci Turner, MD  Interpreting MD: Tereso NewcomerScott Weaver, PA-C  Unique Test No: 1  Treadmill:  1  Indication for ETT: chest pain - rule out ischemia  Contraindication to ETT: No   Stress Modality: exercise - treadmill  Cardiac Imaging Performed: non   Protocol: standard Bruce - maximal  Max BP:  166/51  Max MPHR (bpm):  162 85% MPR (bpm):  138  MPHR obtained (bpm):  151 % MPHR obtained:  93  Reached 85% MPHR (min:sec):  6:54 Total Exercise Time (min-sec):  9:00  Workload in METS:  10.1 Borg Scale: 13  Reason ETT Terminated:  desired heart rate attained    ST Segment Analysis At Rest: normal ST segments - no evidence of significant ST depression With Exercise: non-specific ST changes  Other Information Arrhythmia:  No Angina during ETT:  absent (0) Quality of ETT:  diagnostic  ETT Interpretation:  normal - no evidence of ischemia by ST analysis  Comments: Good exercise capacity. No chest pain. Normal BP response to exercise. There were non-specific ST changes at peak exercise.  This was mostly up-sloping ST depression and resolved early in recovery. No significant ST changes to suggest ischemia.   Recommendations: FU with Dr. Armanda Magicraci Turner as planned. Signed, Tereso NewcomerScott Weaver, PA-C   09/25/2014 11:26 AM

## 2015-01-09 ENCOUNTER — Encounter: Payer: Self-pay | Admitting: Cardiology

## 2015-02-04 ENCOUNTER — Telehealth: Payer: Self-pay | Admitting: Cardiology

## 2015-02-04 NOTE — Telephone Encounter (Signed)
Patient appointment has been made for 02/12/15 2:45pm for Sleep Follow-up.  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

## 2015-02-04 NOTE — Telephone Encounter (Signed)
New Message       Pt calling stating that he needs a DOT physical and that Dr. Mayford Knife has to read the downloads from his C-pap machine and have it faxed prior to September 1st. Please call back and advise.

## 2015-02-12 ENCOUNTER — Encounter: Payer: Self-pay | Admitting: Cardiology

## 2015-02-12 ENCOUNTER — Ambulatory Visit (INDEPENDENT_AMBULATORY_CARE_PROVIDER_SITE_OTHER): Payer: PRIVATE HEALTH INSURANCE | Admitting: Cardiology

## 2015-02-12 VITALS — BP 120/70 | HR 70 | Ht 71.0 in | Wt 280.0 lb

## 2015-02-12 DIAGNOSIS — G4733 Obstructive sleep apnea (adult) (pediatric): Secondary | ICD-10-CM | POA: Diagnosis not present

## 2015-02-12 DIAGNOSIS — M549 Dorsalgia, unspecified: Secondary | ICD-10-CM | POA: Diagnosis not present

## 2015-02-12 DIAGNOSIS — E669 Obesity, unspecified: Secondary | ICD-10-CM | POA: Diagnosis not present

## 2015-02-12 DIAGNOSIS — I10 Essential (primary) hypertension: Secondary | ICD-10-CM | POA: Diagnosis not present

## 2015-02-12 NOTE — Patient Instructions (Signed)
Medication Instructions:  Your physician recommends that you continue on your current medications as directed. Please refer to the Current Medication list given to you today.   Labwork: None  Testing/Procedures: None  Follow-Up: You have been referred to [facility] for back pain.   Your physician wants you to follow-up in: 1 year with Dr. Mayford Knife. You will receive a reminder letter in the mail two months in advance. If you don't receive a letter, please call our office to schedule the follow-up appointment.   Any Other Special Instructions Will Be Listed Below (If Applicable).

## 2015-02-12 NOTE — Progress Notes (Signed)
Cardiology Office Note   Date:  02/12/2015   ID:  [name], DOB Nov 19, 1955, MRN [id number]  PCP:  Lenora Boys, MD    Chief Complaint  Patient presents with  . Follow-up    OSA      History of Present Illness: Jordan Mcconnell is a 59 y.o. male with a history of OSA, HTN and obesity who presents today for followup. He tolerates his CPAP well. He tolerates his nasal pillow mask and feels his pressure is adequate.He feels rested in the am sometimes depending on if he has slept well. When I last saw him he was having some CP and ETT showed no ischemia.  He is not exercising due to problems with back pain.   Past Medical History  Diagnosis Date  . HTN (hypertension)   . Obesity   . OSA (obstructive sleep apnea)     uses CPAP    Past Surgical History  Procedure Laterality Date  . Joint replacement      bilateral knees  . Shoulder arthroscopy    . Esophagogastroduodenoscopy N/A 10/30/2013    Procedure: ESOPHAGOGASTRODUODENOSCOPY (EGD);  Surgeon: Charolett Bumpers, MD;  Location: [facility];  Service: Endoscopy;  Laterality: N/A;     Current Outpatient Prescriptions  Medication Sig Dispense Refill  . dexlansoprazole (DEXILANT) 60 MG capsule Take 60 mg by mouth daily.    . hydrochlorothiazide (HYDRODIURIL) 25 MG tablet TAKE 1 TABLET BY MOUTH EVERY DAY 90 tablet 2   No current facility-administered medications for this visit.    Allergies:   Penicillins and Sulfa antibiotics    Social History:  The patient  reports that he has never smoked. He does not have any smokeless tobacco history on file. He reports that he does not drink alcohol or use illicit drugs.   Family History:  The patient's family history includes Colon cancer in his father.    ROS:  Please see the history of present illness.   Otherwise, review of systems are positive for none.   All other systems are reviewed and negative.    PHYSICAL EXAM: VS:  BP 120/70 mmHg  Pulse  70  Ht 5\' 11"  (1.803 m)  Wt 280 lb (127.007 kg)  BMI 39.07 kg/m2  SpO2 96% , BMI Body mass index is 39.07 kg/(m^2). GEN: Well nourished, well developed, in no acute distress HEENT: normal Neck: no JVD, carotid bruits, or masses Cardiac: RRR; no murmurs, rubs, or gallops,no edema  Respiratory:  clear to auscultation bilaterally, normal work of breathing GI: soft, nontender, nondistended, + BS MS: no deformity or atrophy Skin: warm and dry, no rash Neuro:  Strength and sensation are intact Psych: euthymic mood, full affect   EKG:  EKG is not ordered today.    Recent Labs: No results found for requested labs within last 365 days.    Lipid Panel No results found for: CHOL, TRIG, HDL, CHOLHDL, VLDL, LDLCALC, LDLDIRECT    Wt Readings from Last 3 Encounters:  02/12/15 280 lb (127.007 kg)  08/20/14 274 lb (124.286 kg)  02/21/14 260 lb (117.935 kg)    ASSESSMENT AND PLAN:  1. OSA on CPAP - tolerating his device well. His d/l today showed an AHI of 3.4/hr on 12cm H2O and 96% compliant in using more than 4 hours nightly. He will continue on current setting.Patient has been using and benefiting from CPAP use and will  continue to benefit from therapy.  2. HTN- controlled - continue HCTZ  3. Obesity - His exercise is limited by back pain  4. Chest pain with normal EKG. ETT showed no ischemia and CP has resolved.        5.  Back pain that is causing burning in his legs - i will refer to [facility]    Current medicines are reviewed at length with the patient today.  The patient does not have concerns regarding medicines.  The following changes have been made:  no change  Labs/ tests ordered today: See above Assessment and Plan No orders of the defined types were placed in this encounter.     Disposition:   FU with me in 1 year  Signed, Quintella Reichert, MD  02/12/2015 3:01 PM    [facility] [address] [place], [place], [place]  [address] Phone:  [phone]; Fax: [phone]

## 2015-02-19 ENCOUNTER — Encounter: Payer: Self-pay | Admitting: Cardiology

## 2015-05-02 ENCOUNTER — Telehealth: Payer: Self-pay | Admitting: Cardiology

## 2015-05-02 NOTE — Telephone Encounter (Signed)
Walk in pt form-[facility]-paper dropped off Katy back Monday 10/31 will give to her then.

## 2015-05-05 ENCOUNTER — Telehealth: Payer: Self-pay | Admitting: Cardiology

## 2015-05-05 NOTE — Telephone Encounter (Signed)
New message     Calling to check the status on his clearance form for surgery from [facility].  Pt is having back surgery.  Please call.

## 2015-05-05 NOTE — Telephone Encounter (Signed)
Informed patient that surgical clearance has been received.  Told him Dr. Mayford Knifeurner is not here today but it will be reviewed when she gets back to the office. Assured patient it will be sent prior to 12/29 (proposed surgery date). Patient grateful for callback.

## 2015-06-02 ENCOUNTER — Ambulatory Visit: Payer: Self-pay | Admitting: Physician Assistant

## 2015-06-23 NOTE — Pre-Procedure Instructions (Signed)
Jordan GasmanKenneth W Ronan  06/23/2015    Your procedure is scheduled on December 29.  [facility] Ad[facility] at 5:30 A.M.  Call this number if you have problems the morning of surgery:  [phone]   Remember:  Do not eat food or drink liquids after midnight.  Take these medicines the morning of surgery with A SIP OF WATER Tylenol OR Tramadol (if needed), Dexilant    STOP/ Do not take Aspirin, Aleve, Naproxen, Advil, Ibuprofen, Motrin, Vitamins, Herbs, or Supplements starting December 22   Do not wear jewelry, make-up or nail polish.  Do not wear lotions, powders, or perfumes.  You may wear deodorant.  Do not shave 48 hours prior to surgery.  Men may shave face and neck.  Do not bring valuables to the hospital.  [facility] is not responsible for any belongings or valuables.  Contacts, dentures or bridgework may not be worn into surgery.  Leave your suitcase in the car.  After surgery it may be brought to your room.  For patients admitted to the hospital, discharge time will be determined by your treatment team.  Patients discharged the day of surgery will not be allowed to drive home.   [place] - Preparing for Surgery  Before surgery, you can play an important role.  Because skin is not sterile, your skin needs to be as free of germs as possible.  You can reduce the number of germs on you skin by washing with CHG (chlorahexidine gluconate) soap before surgery.  CHG is an antiseptic cleaner which kills germs and bonds with the skin to continue killing germs even after washing.  Please DO NOT use if you have an allergy to CHG or antibacterial soaps.  If your skin becomes reddened/irritated stop using the CHG and inform your nurse when you arrive at Short Stay.  Do not shave (including legs and underarms) for at least 48 hours prior to the first CHG shower.  You may shave your face.  Please follow these instructions carefully:   1.  Shower with CHG Soap the  night before surgery and the morning of Surgery.  2.  If you choose to wash your hair, wash your hair first as usual with your normal shampoo.  3.  After you shampoo, rinse your hair and body thoroughly to remove the shampoo.  4.  Use CHG as you would any other liquid soap.  You can apply CHG directly to the skin and wash gently with scrungie or a clean washcloth.  5.  Apply the CHG Soap to your body ONLY FROM THE NECK DOWN.  Do not use on open wounds or open sores.  Avoid contact with your eyes, ears, mouth and genitals (private parts).  Wash genitals (private parts) with your normal soap.  6.  Wash thoroughly, paying special attention to the area where your surgery will be performed.  7.  Thoroughly rinse your body with warm water from the neck down.  8.  DO NOT shower/wash with your normal soap after using and rinsing off the CHG Soap.  9.  Pat yourself dry with a clean towel.            10.  Wear clean pajamas.            11.  Place clean sheets on your bed the night of your first shower and do not sleep with pets.  Day of Surgery  Do not apply any lotions the morning of  surgery.  Please wear clean clothes to the hospital/surgery center.  Please read over the following fact sheets that you were given. Pain Booklet, Coughing and Deep Breathing, Blood Transfusion Information and Surgical Site Infection Prevention

## 2015-06-24 ENCOUNTER — Encounter (HOSPITAL_COMMUNITY): Payer: Self-pay | Admitting: Emergency Medicine

## 2015-06-24 ENCOUNTER — Encounter (HOSPITAL_COMMUNITY): Payer: Self-pay

## 2015-06-24 ENCOUNTER — Encounter (HOSPITAL_COMMUNITY)
Admission: RE | Admit: 2015-06-24 | Discharge: 2015-06-24 | Disposition: A | Discharge status: Home / Self Care | Payer: PRIVATE HEALTH INSURANCE | Source: Ambulatory Visit | Attending: Orthopedic Surgery | Admitting: Orthopedic Surgery

## 2015-06-24 DIAGNOSIS — K219 Gastro-esophageal reflux disease without esophagitis: Secondary | ICD-10-CM | POA: Diagnosis not present

## 2015-06-24 DIAGNOSIS — I10 Essential (primary) hypertension: Secondary | ICD-10-CM | POA: Diagnosis not present

## 2015-06-24 DIAGNOSIS — Z79899 Other long term (current) drug therapy: Secondary | ICD-10-CM | POA: Diagnosis not present

## 2015-06-24 DIAGNOSIS — G4733 Obstructive sleep apnea (adult) (pediatric): Secondary | ICD-10-CM | POA: Insufficient documentation

## 2015-06-24 DIAGNOSIS — Z01812 Encounter for preprocedural laboratory examination: Secondary | ICD-10-CM | POA: Insufficient documentation

## 2015-06-24 DIAGNOSIS — Z0183 Encounter for blood typing: Secondary | ICD-10-CM | POA: Insufficient documentation

## 2015-06-24 DIAGNOSIS — Z01818 Encounter for other preprocedural examination: Secondary | ICD-10-CM | POA: Diagnosis not present

## 2015-06-24 HISTORY — DX: Gastro-esophageal reflux disease without esophagitis: K21.9

## 2015-06-24 LAB — BASIC METABOLIC PANEL
Anion gap: 11 (ref 5–15)
BUN: 19 mg/dL (ref 6–20)
CHLORIDE: 102 mmol/L (ref 101–111)
CO2: 25 mmol/L (ref 22–32)
CREATININE: 0.83 mg/dL (ref 0.61–1.24)
Calcium: 9.3 mg/dL (ref 8.9–10.3)
Glucose, Bld: 110 mg/dL — ABNORMAL HIGH (ref 65–99)
POTASSIUM: 4.1 mmol/L (ref 3.5–5.1)
SODIUM: 138 mmol/L (ref 135–145)

## 2015-06-24 LAB — CBC
HEMATOCRIT: 47.4 % (ref 39.0–52.0)
HEMOGLOBIN: 15.9 g/dL (ref 13.0–17.0)
MCH: 29.1 pg (ref 26.0–34.0)
MCHC: 33.5 g/dL (ref 30.0–36.0)
MCV: 86.7 fL (ref 78.0–100.0)
Platelets: 183 10*3/uL (ref 150–400)
RBC: 5.47 MIL/uL (ref 4.22–5.81)
RDW: 13.8 % (ref 11.5–15.5)
WBC: 7.7 10*3/uL (ref 4.0–10.5)

## 2015-06-24 LAB — TYPE AND SCREEN
ABO/RH(D): O POS
Antibody Screen: NEGATIVE

## 2015-06-24 LAB — SURGICAL PCR SCREEN
MRSA, PCR: NEGATIVE
Staphylococcus aureus: NEGATIVE

## 2015-06-24 LAB — ABO/RH: ABO/RH(D): O POS

## 2015-06-24 NOTE — Progress Notes (Addendum)
Patient denies chest pain/ shob. Reports that PCP is Loney [place] (he is in the process of transitioning to a new provider as his is retiring). Dr. Mayford Knifeurner is cardiologist recent OV noted. Stress test in 09/2014 I printed this off and placed on shadow chart as it took a long time to pull up. Cardiac clearance form noted under media tab.

## 2015-06-24 NOTE — Progress Notes (Signed)
Anesthesia Chart Review:  Pt is 59 year old male scheduled for L3-4 lumbar decompression, L4-5 insitu fusion on 07/03/2015 with Dr. Shon BatonBrooks.   Cardiologist is Dr. Armanda Magicraci Turner  PMH includes:  HTN, OSA (on CPAP), GERD. Never smoker. BMI 41  Medications include: hctz, dexlansoprazole  Preoperative labs reviewed.    EKG 08/30/14: NSR.   Exercise tolerance test 09/25/14:  - Good exercise capacity. - No chest pain. - Normal BP response to exercise. - There were non-specific ST changes at peak exercise. This was mostly up-sloping ST depression and resolved early in recovery. - No significant ST changes to suggest ischemia.   Pt has cardiac clearance from Dr. Mayford Knifeurner (found in media tab).   If no changes, I anticipate pt can proceed with surgery as scheduled.   Rica Mastngela Hector Venne, FNP-BC [facility] Short [facility] Surgical Center/Anesthesiology Phone: [phone] 06/24/2015 4:19 PM

## 2015-06-25 ENCOUNTER — Other Ambulatory Visit: Payer: Self-pay

## 2015-06-25 MED ORDER — HYDROCHLOROTHIAZIDE 25 MG PO TABS: ORAL_TABLET | ORAL | Status: DC

## 2015-06-25 NOTE — Telephone Encounter (Signed)
Quintella Reichertraci R Turner, MD at 02/12/2015 3:01 PM  hydrochlorothiazide (HYDRODIURIL) 25 MG tabletTAKE 1 TABLET BY MOUTH EVERY DAY 2. HTN- controlled - continue HCTZ  Medication Instructions:  Your physician recommends that you continue on your current medications as directed. Please refer to the Current Medication list given to you today.

## 2015-07-03 ENCOUNTER — Encounter (HOSPITAL_COMMUNITY): Admission: RE | Payer: Self-pay | Source: Ambulatory Visit

## 2015-07-03 ENCOUNTER — Inpatient Hospital Stay (HOSPITAL_COMMUNITY)
Admission: RE | Admit: 2015-07-03 | Payer: PRIVATE HEALTH INSURANCE | Source: Ambulatory Visit | Admitting: Orthopedic Surgery

## 2015-07-03 SURGERY — LUMBAR LAMINECTOMY/DECOMPRESSION MICRODISCECTOMY 2 LEVELS
Anesthesia: General

## 2015-07-15 ENCOUNTER — Encounter: Payer: Self-pay | Admitting: Cardiology

## 2015-07-31 MED ORDER — VANCOMYCIN HCL 10 G IV SOLR: 1500.0000 mg | INTRAVENOUS | Status: DC

## 2015-07-31 NOTE — Pre-Procedure Instructions (Addendum)
    Jordan Mcconnell  07/31/2015      [id number] - [place], [place] - [address] [place] [address] Phone: [phone] Fax: [phone]    Your procedure is scheduled on Wednesday, February 1..  Report to [facility] Admitting at 10:20 A.M.              Your surgery or procedure is scheduled for 12:20 PM   Call this number if you have problems the morning of surgery:[phone]                     For any other questions, please call [phone], Monday - Friday 8 AM - 4 PM.    Remember:  Do not eat food or drink liquids after midnight Tuesday,  January 31.  Take these medicines the morning of surgery with A SIP OF WATER : dexlansoprazole (DEXILANT).                May take pain medication if needed.   Do not wear jewelry, make-up or nail polish.  Do not wear lotions, powders, or perfumes.    Men may shave face and neck.  Do not bring valuables to the hospital.  [facility] is not responsible for any belongings or valuables.  Contacts, dentures or bridgework may not be worn into surgery.  Leave your suitcase in the car.  After surgery it may be brought to your room.  For patients admitted to the hospital, discharge time will be determined by your treatment team.  Patients discharged the day of surgery will not be allowed to drive home.   Name and phone number of your driver:  -  Special instructions:  Review  [place] - Preparing For Surgery.  Please read over the following fact sheets that you were given. Pain Booklet, Coughing and Deep Breathing, Blood Transfusion Information and Surgical Site Infection Prevention

## 2015-08-01 ENCOUNTER — Ambulatory Visit: Payer: Self-pay | Admitting: Physician Assistant

## 2015-08-01 ENCOUNTER — Encounter (HOSPITAL_COMMUNITY): Payer: Self-pay

## 2015-08-01 ENCOUNTER — Encounter (HOSPITAL_COMMUNITY)
Admission: RE | Admit: 2015-08-01 | Discharge: 2015-08-01 | Disposition: A | Discharge status: Home / Self Care | Payer: PRIVATE HEALTH INSURANCE | Source: Ambulatory Visit | Attending: Orthopedic Surgery | Admitting: Orthopedic Surgery

## 2015-08-01 DIAGNOSIS — K219 Gastro-esophageal reflux disease without esophagitis: Secondary | ICD-10-CM | POA: Diagnosis not present

## 2015-08-01 DIAGNOSIS — Z79899 Other long term (current) drug therapy: Secondary | ICD-10-CM | POA: Insufficient documentation

## 2015-08-01 DIAGNOSIS — Z01818 Encounter for other preprocedural examination: Secondary | ICD-10-CM | POA: Diagnosis not present

## 2015-08-01 DIAGNOSIS — Z01812 Encounter for preprocedural laboratory examination: Secondary | ICD-10-CM | POA: Diagnosis not present

## 2015-08-01 DIAGNOSIS — I10 Essential (primary) hypertension: Secondary | ICD-10-CM | POA: Insufficient documentation

## 2015-08-01 DIAGNOSIS — G4733 Obstructive sleep apnea (adult) (pediatric): Secondary | ICD-10-CM | POA: Insufficient documentation

## 2015-08-01 DIAGNOSIS — Z0183 Encounter for blood typing: Secondary | ICD-10-CM | POA: Diagnosis not present

## 2015-08-01 DIAGNOSIS — M5410 Radiculopathy, site unspecified: Secondary | ICD-10-CM | POA: Insufficient documentation

## 2015-08-01 HISTORY — DX: Adverse effect of unspecified anesthetic, initial encounter: T41.45XA

## 2015-08-01 HISTORY — DX: Other complications of anesthesia, initial encounter: T88.59XA

## 2015-08-01 LAB — BASIC METABOLIC PANEL
Anion gap: 9 (ref 5–15)
BUN: 22 mg/dL — AB (ref 6–20)
CHLORIDE: 104 mmol/L (ref 101–111)
CO2: 28 mmol/L (ref 22–32)
CREATININE: 0.89 mg/dL (ref 0.61–1.24)
Calcium: 9.6 mg/dL (ref 8.9–10.3)
GFR calc Af Amer: >60 mL/min (ref >60)
GFR calc non Af Amer: >60 mL/min (ref >60)
Glucose, Bld: 122 mg/dL — ABNORMAL HIGH (ref 65–99)
POTASSIUM: 4.6 mmol/L (ref 3.5–5.1)
Sodium: 141 mmol/L (ref 135–145)

## 2015-08-01 LAB — CBC WITH DIFFERENTIAL/PLATELET
Basophils Absolute: 0 10*3/uL (ref 0.0–0.1)
Basophils Relative: 0 %
EOS PCT: 2 %
Eosinophils Absolute: 0.2 10*3/uL (ref 0.0–0.7)
HCT: 46.5 % (ref 39.0–52.0)
Hemoglobin: 15.6 g/dL (ref 13.0–17.0)
LYMPHS ABS: 2.9 10*3/uL (ref 0.7–4.0)
LYMPHS PCT: 35 %
MCH: 28.9 pg (ref 26.0–34.0)
MCHC: 33.5 g/dL (ref 30.0–36.0)
MCV: 86.3 fL (ref 78.0–100.0)
MONO ABS: 0.6 10*3/uL (ref 0.1–1.0)
MONOS PCT: 7 %
Neutro Abs: 4.5 10*3/uL (ref 1.7–7.7)
Neutrophils Relative %: 56 %
PLATELETS: 195 10*3/uL (ref 150–400)
RBC: 5.39 MIL/uL (ref 4.22–5.81)
RDW: 13.6 % (ref 11.5–15.5)
WBC: 8.2 10*3/uL (ref 4.0–10.5)

## 2015-08-01 LAB — SURGICAL PCR SCREEN
MRSA, PCR: NEGATIVE
Staphylococcus aureus: NEGATIVE

## 2015-08-01 LAB — TYPE AND SCREEN
ABO/RH(D): O POS
Antibody Screen: NEGATIVE

## 2015-08-01 NOTE — Progress Notes (Addendum)
NO orders in epic, notified  Dr.Brook's office.  PCP: Dr. Foy [place] Cardiologist: Dr. Armanda Magic

## 2015-08-04 NOTE — Progress Notes (Signed)
Anesthesia Chart Review:  Pt is 60 year old male scheduled for L4-5 transforaminal lumbar interbody fusion on 08/06/2015 with Dr. Shon Baton.   Cardiologist is Dr. Armanda Magic. PCP is Dr. Marinda Elk.   PMH includes: HTN, OSA (on CPAP), GERD. Never smoker. BMI 41  Medications include: hctz, dexlansoprazole  Preoperative labs reviewed.   EKG 08/30/14: NSR.   Exercise tolerance test 09/25/14:  - Good exercise capacity. - No chest pain. - Normal BP response to exercise. - There were non-specific ST changes at peak exercise. This was mostly up-sloping ST depression and resolved early in recovery. - No significant ST changes to suggest ischemia.   Pt has cardiac clearance from Dr. Mayford Knife (found in media tab).   If no changes, I anticipate pt can proceed with surgery as scheduled.   Rica Mast, FNP-BC [facility] Short Stay Surgical Center/Anesthesiology Phone: [phone] 08/04/2015 12:01 PM

## 2015-08-05 MED ORDER — VANCOMYCIN HCL 10 G IV SOLR
1500.0000 mg | INTRAVENOUS | Status: AC
  Administered 2015-08-06: 1250 mg via INTRAVENOUS
  Filled 2015-08-05 (×2): qty 1500

## 2015-08-05 NOTE — H&P (Addendum)
History of Present Illness The patient is a 60 year old male who presents today for follow up of their back. The patient is being followed for their He returning today stating "I don't know where to go from here". Symptoms reported today include: pain ("the pain is going up into the back of my head now"), pain at night, aching, stiffness, pain with weightbearing ("when it flares"), difficulty ambulating ("sometimes"), numbness (bilateral anterior thighs), burning (bilateral thighs), pain with lying, pain with lifting, pain with sitting and pain with standing, while the patient does not report symptoms of: swelling, throbbing, locking, catching, popping, grinding, giving way, instability, difficulty arising from chair, pain with overhead motions, leg pain or foot pain. The patient states that they are doing poorly. Current treatment includes: physical therapy ("it did not do a whole lot of good") and activity modification. The following medication has been used for pain control: antiinflammatory medication (Advil) and Tramadol . The patient reports their current pain level to be 8 / 10. The patient presents today following MRI (that was done at [facility]) and ESI (L5-S1 on 05/13/15). The patient has reported improvement of their symptoms with: activity modification, Cortisone injections and physical therapy. The patient indicates that they have questions or concerns today regarding pain, their progress at this point and "why is my insurance not approving my sugery".  Subjective Transcription He returns today for followup. I did tell him that as of now I am still trying to clarify things with his insurance company. We have again gone over his MRI and his plain x-rays. He has a grade 1 slip at L4-5 with right lateral recess and foraminal stenosis causing nerve compression. He has significant facet arthropathy at L5-S1 and he has moderate central stenosis at L3-4. At this point, I do think that his principal source  of pain is the L4-5 level. I do not think a multilevel fusion procedure is going to be advisable as a good pain reducing procedure for him. At this point after discussing treatment options which could be initially what we thought a two level decompression and in situ fusion versus an instrumented fusion. He has requested to proceed with the instrumented fusion.  Problem List/Past Medical  Problems Reconciled   Allergies  Sulfa Drugs  PENICILLIN 07/15/1998 (Marked as Inactive)  Family History  Cancer  father  Social History Tobacco / smoke exposure  yes Tobacco use  never smoker Living situation  live with spouse Illicit drug use  no Exercise  Exercises weekly; does running / walking Pain Contract  no Number of flights of stairs before winded  greater than 5 Marital status  married Drug/Alcohol Rehab (Previously)  no Alcohol use  never consumed alcohol Drug/Alcohol Rehab (Currently)  no Current work status  working full time Children  2  Medication History Advil (  Tablet, Oral) Active. (prn) Hydrochlorothiazide (  Tablet, Oral) Active. (qd) Omeprazole (  Capsule DR, Oral) Active. (  qd) Medications Reconciled  Objective Transcription On clinical exam, he has L4-L5 dysesthesias on the right side. He has got some trace weakness of his EHL and tibialis anterior. He has got a positive straight leg raise test. He has significant back pain with range of motion including flexion, extension and rotation. No obvious skin lesions, abrasions, contusion. No loss of bowel or bladder control. No real hip, knee or ankle pain with joint range of motion.     Assessment & Plan Posterior decompression/Fusion:Risks of surgery include infection, bleeding, nerve damage, death, stroke, paralysis, failure to heal,  need for further surgery, ongoing or worse pain, need for further surgery, CSF leak, loss of bowel or bladder, and recurrent disc herniation or stenosis  which would necessitate need for further surgery. Non-union, hardware failure, adjacent segment disease and recurrent pain. Hardware breakage, mal-position requiring surgery to correct or remove.  Goal Of Surgery: Discussed that goal of surgery is to reduce pain and improve function and quality of life. Patient is aware that despite all appropriate treatment that there pain and function could be the same, worse, or different.  Plans Transcription At this point, I have gone over the risks of a fusion procedure which include infection, bleeding, nerve damage, death, stroke, paralysis, failure to heal, need for further surgery, ongoing or worse pain, loss of bowel or bladder control, need for further surgery, adjacent segment disease. The goal of surgery is reduction in his back pain, reduction in his radicular leg pain and an improvement in his quality of life. I did tell him that there is a risk of adjacent segment disease and his stenosis or facet arthropathy could get worse, but again I would try and manage that conservatively and not with fusions. There is no gross instability pattern at the 3-4 or 5-1 level that would make me think instrumentation extension will be required at those levels. Again indication for surgery is failed conservative management consisting of physical therapy and injection therapy and observation along with medications. Procedure would be a single level transforaminal lumbar interbody fusion at the L4-5 level. Goal is reduction in neuropathic right leg pain and back pain, 70 to 75% success rate.   No change in clinical exam

## 2015-08-06 ENCOUNTER — Encounter (HOSPITAL_COMMUNITY): Payer: Self-pay | Admitting: Anesthesiology

## 2015-08-06 ENCOUNTER — Inpatient Hospital Stay (HOSPITAL_COMMUNITY)
Admission: RE | Admit: 2015-08-06 | Discharge: 2015-08-09 | DRG: 460 | Disposition: A | Discharge status: Home / Self Care | Payer: PRIVATE HEALTH INSURANCE | Source: Ambulatory Visit | Attending: Orthopedic Surgery | Admitting: Orthopedic Surgery

## 2015-08-06 ENCOUNTER — Encounter (HOSPITAL_COMMUNITY)
Admission: RE | Disposition: A | Discharge status: Home / Self Care | Payer: PRIVATE HEALTH INSURANCE | Source: Ambulatory Visit | Attending: Orthopedic Surgery

## 2015-08-06 ENCOUNTER — Inpatient Hospital Stay (HOSPITAL_COMMUNITY): Payer: PRIVATE HEALTH INSURANCE | Admitting: Emergency Medicine

## 2015-08-06 ENCOUNTER — Inpatient Hospital Stay (HOSPITAL_COMMUNITY): Payer: PRIVATE HEALTH INSURANCE | Admitting: Anesthesiology

## 2015-08-06 ENCOUNTER — Inpatient Hospital Stay (HOSPITAL_COMMUNITY): Payer: PRIVATE HEALTH INSURANCE

## 2015-08-06 DIAGNOSIS — I10 Essential (primary) hypertension: Secondary | ICD-10-CM | POA: Diagnosis present

## 2015-08-06 DIAGNOSIS — M4316 Spondylolisthesis, lumbar region: Principal | ICD-10-CM | POA: Diagnosis present

## 2015-08-06 DIAGNOSIS — K219 Gastro-esophageal reflux disease without esophagitis: Secondary | ICD-10-CM | POA: Diagnosis present

## 2015-08-06 DIAGNOSIS — M4806 Spinal stenosis, lumbar region: Secondary | ICD-10-CM | POA: Diagnosis present

## 2015-08-06 DIAGNOSIS — Z79899 Other long term (current) drug therapy: Secondary | ICD-10-CM | POA: Diagnosis not present

## 2015-08-06 DIAGNOSIS — Z6839 Body mass index (BMI) 39.0-39.9, adult: Secondary | ICD-10-CM | POA: Diagnosis not present

## 2015-08-06 DIAGNOSIS — Z9889 Other specified postprocedural states: Secondary | ICD-10-CM

## 2015-08-06 DIAGNOSIS — M549 Dorsalgia, unspecified: Secondary | ICD-10-CM | POA: Diagnosis present

## 2015-08-06 DIAGNOSIS — K5900 Constipation, unspecified: Secondary | ICD-10-CM | POA: Diagnosis not present

## 2015-08-06 DIAGNOSIS — Z419 Encounter for procedure for purposes other than remedying health state, unspecified: Secondary | ICD-10-CM

## 2015-08-06 HISTORY — PX: BACK SURGERY: SHX140

## 2015-08-06 SURGERY — POSTERIOR LUMBAR FUSION 1 LEVEL
Anesthesia: General

## 2015-08-06 MED ORDER — ONDANSETRON HCL 4 MG/2ML IJ SOLN
INTRAMUSCULAR | Status: DC | PRN
  Administered 2015-08-06: 4 mg via INTRAVENOUS

## 2015-08-06 MED ORDER — SURGIFOAM 100 EX MISC
CUTANEOUS | Status: DC | PRN
  Administered 2015-08-06: 1 via TOPICAL

## 2015-08-06 MED ORDER — PROPOFOL 10 MG/ML IV BOLUS
INTRAVENOUS | Status: AC
  Filled 2015-08-06: qty 20

## 2015-08-06 MED ORDER — PHENYLEPHRINE HCL 10 MG/ML IJ SOLN
INTRAMUSCULAR | Status: DC | PRN
  Administered 2015-08-06 (×3): 80 ug via INTRAVENOUS
  Administered 2015-08-06: 120 ug via INTRAVENOUS

## 2015-08-06 MED ORDER — PROPOFOL 500 MG/50ML IV EMUL
INTRAVENOUS | Status: DC | PRN
  Administered 2015-08-06: 50 ug/kg/min via INTRAVENOUS

## 2015-08-06 MED ORDER — EPHEDRINE SULFATE 50 MG/ML IJ SOLN
INTRAMUSCULAR | Status: DC | PRN
  Administered 2015-08-06: 10 mg via INTRAVENOUS
  Administered 2015-08-06: 5 mg via INTRAVENOUS

## 2015-08-06 MED ORDER — DEXAMETHASONE 4 MG PO TABS
4.0000 mg | ORAL_TABLET | Freq: Four times a day (QID) | ORAL | Status: AC
  Administered 2015-08-07: 4 mg via ORAL
  Filled 2015-08-06: qty 1

## 2015-08-06 MED ORDER — PHENOL 1.4 % MT LIQD: 1.0000 | OROMUCOSAL | Status: DC | PRN

## 2015-08-06 MED ORDER — OXYCODONE HCL 5 MG PO TABS
10.0000 mg | ORAL_TABLET | ORAL | Status: DC | PRN
  Administered 2015-08-07 – 2015-08-09 (×7): 10 mg via ORAL
  Filled 2015-08-06 (×7): qty 2

## 2015-08-06 MED ORDER — OXYCODONE HCL 5 MG/5ML PO SOLN: 5.0000 mg | Freq: Once | ORAL | Status: DC | PRN

## 2015-08-06 MED ORDER — LIDOCAINE HCL (CARDIAC) 20 MG/ML IV SOLN
INTRAVENOUS | Status: DC | PRN
  Administered 2015-08-06: 70 mg via INTRAVENOUS

## 2015-08-06 MED ORDER — HYDROMORPHONE HCL 1 MG/ML IJ SOLN
0.5000 mg | INTRAMUSCULAR | Status: DC | PRN
  Administered 2015-08-06 – 2015-08-08 (×8): 1 mg via INTRAVENOUS
  Filled 2015-08-06 (×7): qty 1

## 2015-08-06 MED ORDER — DEXTROSE 5 % IV SOLN
10.0000 mg | INTRAVENOUS | Status: DC | PRN
  Administered 2015-08-06: 10 ug/min via INTRAVENOUS

## 2015-08-06 MED ORDER — DEXAMETHASONE SODIUM PHOSPHATE 4 MG/ML IJ SOLN
INTRAMUSCULAR | Status: DC | PRN
  Administered 2015-08-06: 8 mg via INTRAVENOUS

## 2015-08-06 MED ORDER — DEXAMETHASONE SODIUM PHOSPHATE 4 MG/ML IJ SOLN
4.0000 mg | Freq: Four times a day (QID) | INTRAMUSCULAR | Status: AC
  Administered 2015-08-07: 4 mg via INTRAVENOUS
  Filled 2015-08-06 (×2): qty 1

## 2015-08-06 MED ORDER — THROMBIN 20000 UNITS EX SOLR
CUTANEOUS | Status: AC
  Filled 2015-08-06: qty 20000

## 2015-08-06 MED ORDER — FENTANYL CITRATE (PF) 100 MCG/2ML IJ SOLN
INTRAMUSCULAR | Status: DC | PRN
  Administered 2015-08-06 (×4): 50 ug via INTRAVENOUS
  Administered 2015-08-06: 200 ug via INTRAVENOUS

## 2015-08-06 MED ORDER — FLEET ENEMA 7-19 GM/118ML RE ENEM: 1.0000 | ENEMA | Freq: Once | RECTAL | Status: DC

## 2015-08-06 MED ORDER — BUPIVACAINE-EPINEPHRINE 0.25% -1:200000 IJ SOLN
INTRAMUSCULAR | Status: DC | PRN
  Administered 2015-08-06: 16 mL

## 2015-08-06 MED ORDER — 0.9 % SODIUM CHLORIDE (POUR BTL) OPTIME
TOPICAL | Status: DC | PRN
  Administered 2015-08-06: 1000 mL

## 2015-08-06 MED ORDER — HEMOSTATIC AGENTS (NO CHARGE) OPTIME
TOPICAL | Status: DC | PRN
  Administered 2015-08-06: 1 via TOPICAL

## 2015-08-06 MED ORDER — HYDROCHLOROTHIAZIDE 25 MG PO TABS
25.0000 mg | ORAL_TABLET | Freq: Every day | ORAL | Status: DC
  Administered 2015-08-07 – 2015-08-08 (×2): 25 mg via ORAL
  Filled 2015-08-06 (×2): qty 1

## 2015-08-06 MED ORDER — MAGNESIUM CITRATE PO SOLN: 0.5000 | Freq: Once | ORAL | Status: DC

## 2015-08-06 MED ORDER — METHOCARBAMOL 1000 MG/10ML IJ SOLN
500.0000 mg | Freq: Four times a day (QID) | INTRAVENOUS | Status: DC | PRN
  Filled 2015-08-06: qty 5

## 2015-08-06 MED ORDER — FENTANYL CITRATE (PF) 250 MCG/5ML IJ SOLN
INTRAMUSCULAR | Status: AC
  Filled 2015-08-06: qty 5

## 2015-08-06 MED ORDER — MIDAZOLAM HCL 5 MG/5ML IJ SOLN
INTRAMUSCULAR | Status: DC | PRN
  Administered 2015-08-06: 2 mg via INTRAVENOUS

## 2015-08-06 MED ORDER — HYDROMORPHONE HCL 1 MG/ML IJ SOLN
0.2500 mg | INTRAMUSCULAR | Status: DC | PRN
  Administered 2015-08-06 (×2): 0.5 mg via INTRAVENOUS

## 2015-08-06 MED ORDER — MENTHOL 3 MG MT LOZG: 1.0000 | LOZENGE | OROMUCOSAL | Status: DC | PRN

## 2015-08-06 MED ORDER — HYDROMORPHONE HCL 1 MG/ML IJ SOLN
INTRAMUSCULAR | Status: AC
  Filled 2015-08-06: qty 1

## 2015-08-06 MED ORDER — LACTATED RINGERS IV SOLN
INTRAVENOUS | Status: DC
  Administered 2015-08-06 (×3): via INTRAVENOUS

## 2015-08-06 MED ORDER — THROMBIN 20000 UNITS EX KIT
PACK | CUTANEOUS | Status: DC | PRN
  Administered 2015-08-06: 20000 [IU] via TOPICAL

## 2015-08-06 MED ORDER — SODIUM CHLORIDE 0.9 % IV SOLN: 250.0000 mL | INTRAVENOUS | Status: DC

## 2015-08-06 MED ORDER — DEXAMETHASONE SODIUM PHOSPHATE 4 MG/ML IJ SOLN
INTRAMUSCULAR | Status: AC
  Filled 2015-08-06: qty 2

## 2015-08-06 MED ORDER — BUPIVACAINE HCL (PF) 0.25 % IJ SOLN
INTRAMUSCULAR | Status: AC
  Filled 2015-08-06: qty 30

## 2015-08-06 MED ORDER — LACTATED RINGERS IV SOLN
INTRAVENOUS | Status: DC
  Administered 2015-08-06: 21:00:00 via INTRAVENOUS

## 2015-08-06 MED ORDER — OXYCODONE HCL 5 MG PO TABS: 5.0000 mg | ORAL_TABLET | Freq: Once | ORAL | Status: DC | PRN

## 2015-08-06 MED ORDER — SODIUM CHLORIDE 0.9% FLUSH: 3.0000 mL | INTRAVENOUS | Status: DC | PRN

## 2015-08-06 MED ORDER — METHOCARBAMOL 500 MG PO TABS
500.0000 mg | ORAL_TABLET | Freq: Four times a day (QID) | ORAL | Status: DC | PRN
  Administered 2015-08-06 – 2015-08-09 (×4): 500 mg via ORAL
  Filled 2015-08-06 (×4): qty 1

## 2015-08-06 MED ORDER — SUCCINYLCHOLINE CHLORIDE 20 MG/ML IJ SOLN
INTRAMUSCULAR | Status: DC | PRN
  Administered 2015-08-06: 80 mg via INTRAVENOUS

## 2015-08-06 MED ORDER — PROPOFOL 10 MG/ML IV BOLUS
INTRAVENOUS | Status: DC | PRN
  Administered 2015-08-06: 160 mg via INTRAVENOUS

## 2015-08-06 MED ORDER — MIDAZOLAM HCL 2 MG/2ML IJ SOLN
INTRAMUSCULAR | Status: AC
  Filled 2015-08-06: qty 2

## 2015-08-06 MED ORDER — VANCOMYCIN HCL 10 G IV SOLR
1500.0000 mg | Freq: Once | INTRAVENOUS | Status: AC
  Administered 2015-08-07: 1500 mg via INTRAVENOUS
  Filled 2015-08-06: qty 1500

## 2015-08-06 MED ORDER — ACETAMINOPHEN 160 MG/5ML PO SOLN: 325.0000 mg | ORAL | Status: DC | PRN

## 2015-08-06 MED ORDER — ACETAMINOPHEN 325 MG PO TABS: 325.0000 mg | ORAL_TABLET | ORAL | Status: DC | PRN

## 2015-08-06 MED ORDER — SODIUM CHLORIDE 0.9% FLUSH
3.0000 mL | Freq: Two times a day (BID) | INTRAVENOUS | Status: DC
  Administered 2015-08-07: 3 mL via INTRAVENOUS

## 2015-08-06 MED ORDER — ONDANSETRON HCL 4 MG/2ML IJ SOLN: 4.0000 mg | INTRAMUSCULAR | Status: DC | PRN

## 2015-08-06 SURGICAL SUPPLY — 71 items
BLADE SURG ROTATE 9660 (MISCELLANEOUS) IMPLANT
BUR EGG ELITE 4.0 (BURR) IMPLANT
CLIP NEUROVISION LG (CLIP) ×1 IMPLANT
CLSR STERI-STRIP ANTIMIC 1/2X4 (GAUZE/BANDAGES/DRESSINGS) ×2 IMPLANT
CONT SPEC 4OZ CLIKSEAL STRL BL (MISCELLANEOUS) ×1 IMPLANT
COVER SURGICAL LIGHT HANDLE (MISCELLANEOUS) ×2 IMPLANT
DRAPE C-ARM 42X72 X-RAY (DRAPES) ×2 IMPLANT
DRAPE C-ARMOR (DRAPES) ×2 IMPLANT
DRAPE POUCH INSTRU U-SHP 10X18 (DRAPES) ×2 IMPLANT
DRAPE SURG 17X23 STRL (DRAPES) ×2 IMPLANT
DRAPE U-SHAPE 47X51 STRL (DRAPES) ×2 IMPLANT
DRSG MEPILEX BORDER 4X8 (GAUZE/BANDAGES/DRESSINGS) ×2 IMPLANT
DURAPREP 26ML APPLICATOR (WOUND CARE) ×2 IMPLANT
ELECT BLADE 4.0 EZ CLEAN MEGAD (MISCELLANEOUS)
ELECT BLADE 6.5 EXT (BLADE) ×1 IMPLANT
ELECT PENCIL ROCKER SW 15FT (MISCELLANEOUS) ×2 IMPLANT
ELECT REM PT RETURN 9FT ADLT (ELECTROSURGICAL) ×2
ELECTRODE BLDE 4.0 EZ CLN MEGD (MISCELLANEOUS) ×1 IMPLANT
ELECTRODE REM PT RTRN 9FT ADLT (ELECTROSURGICAL) ×1 IMPLANT
GLOVE BIOGEL PI IND STRL 8.5 (GLOVE) ×1 IMPLANT
GLOVE BIOGEL PI INDICATOR 8.5 (GLOVE) ×1
GLOVE SS BIOGEL STRL SZ 8.5 (GLOVE) ×1 IMPLANT
GLOVE SUPERSENSE BIOGEL SZ 8.5 (GLOVE) ×1
GOWN STRL REUS W/ TWL LRG LVL3 (GOWN DISPOSABLE) ×1 IMPLANT
GOWN STRL REUS W/TWL 2XL LVL3 (GOWN DISPOSABLE) ×4 IMPLANT
GOWN STRL REUS W/TWL LRG LVL3 (GOWN DISPOSABLE) ×2
GUIDEWIRE NITINOL BEVEL TIP (WIRE) ×4 IMPLANT
KIT BASIN OR (CUSTOM PROCEDURE TRAY) ×2 IMPLANT
KIT POSITION SURG JACKSON T1 (MISCELLANEOUS) IMPLANT
KIT ROOM TURNOVER OR (KITS) ×2 IMPLANT
LIGHT SOURCE ANGLE TIP STR 7FT (MISCELLANEOUS) ×1 IMPLANT
MODULE NVM5 NEXT GEN EMG (NEEDLE) ×1 IMPLANT
NDL I-PASS III (NEEDLE) IMPLANT
NDL SPNL 18GX3.5 QUINCKE PK (NEEDLE) ×2 IMPLANT
NEEDLE 22X1 1/2 (OR ONLY) (NEEDLE) ×2 IMPLANT
NEEDLE I-PASS III (NEEDLE) ×2 IMPLANT
NEEDLE SPNL 18GX3.5 QUINCKE PK (NEEDLE) ×4 IMPLANT
NS IRRIG 1000ML POUR BTL (IV SOLUTION) ×1 IMPLANT
PACK LAMINECTOMY ORTHO (CUSTOM PROCEDURE TRAY) ×2 IMPLANT
PACK UNIVERSAL I (CUSTOM PROCEDURE TRAY) ×2 IMPLANT
PAD ARMBOARD 7.5X6 YLW CONV (MISCELLANEOUS) ×4 IMPLANT
PATTIES SURGICAL .5 X.5 (GAUZE/BANDAGES/DRESSINGS) IMPLANT
PATTIES SURGICAL .5 X1 (DISPOSABLE) ×2 IMPLANT
POSITIONER HEAD PRONE TRACH (MISCELLANEOUS) ×2 IMPLANT
PROBE BALL TIP NVM5 SNG USE (BALLOONS) ×1 IMPLANT
PUTTY DBX 1CC (Putty) ×2 IMPLANT
PUTTY DBX 1CC DEPUY (Putty) IMPLANT
ROD RELINE MAS LORD 5.5X45MM (Rod) ×1 IMPLANT
ROD RELINE MAS LORD 5.5X50 (Rod) ×1 IMPLANT
SCREW LOCK RELINE 5.5 TULIP (Screw) ×4 IMPLANT
SCREW RELINE RED 6.5X45MM POLY (Screw) ×2 IMPLANT
SCREW SHANK RELINE 6.5X45MM 2C (Screw) ×2 IMPLANT
SHEET CONFORM 45LX20WX5H (Bone Implant) ×1 IMPLANT
SPINE TULIP RELINE MOD (Neuro Prosthesis/Implant) ×4 IMPLANT
SPONGE LAP 4X18 X RAY DECT (DISPOSABLE) ×4 IMPLANT
SPONGE SURGIFOAM ABS GEL 100 (HEMOSTASIS) ×2 IMPLANT
SURGIFLO W/THROMBIN 8M KIT (HEMOSTASIS) ×1 IMPLANT
SUT BONE WAX W31G (SUTURE) ×2 IMPLANT
SUT MNCRL AB 3-0 PS2 18 (SUTURE) ×4 IMPLANT
SUT VIC AB 1 CT1 18XCR BRD 8 (SUTURE) ×1 IMPLANT
SUT VIC AB 1 CT1 8-18 (SUTURE) ×2
SUT VIC AB 2-0 CT1 18 (SUTURE) ×2 IMPLANT
SYR BULB IRRIGATION 50ML (SYRINGE) ×2 IMPLANT
SYR CONTROL 10ML LL (SYRINGE) ×2 IMPLANT
TLIF XLRG 11MM (Neuro Prosthesis/Implant) ×1 IMPLANT
TOWEL OR 17X24 6PK STRL BLUE (TOWEL DISPOSABLE) ×2 IMPLANT
TOWEL OR 17X26 10 PK STRL BLUE (TOWEL DISPOSABLE) ×2 IMPLANT
TRAY FOLEY CATH 16FRSI W/METER (SET/KITS/TRAYS/PACK) ×2 IMPLANT
TULIP SPINE RELINE MOD (Neuro Prosthesis/Implant) IMPLANT
WATER STERILE IRR 1000ML POUR (IV SOLUTION) ×2 IMPLANT
YANKAUER SUCT BULB TIP NO VENT (SUCTIONS) ×2 IMPLANT

## 2015-08-06 NOTE — Op Note (Signed)
NAME:  Jordan Mcconnell, Jordan Mcconnell NO.:  [id number]  MEDICAL RECORD NO.:  [id number]  LOCATION:  [facility]                         FACILITY:  [facility]  PHYSICIAN:  Ike Maragh D. Shon Baton, M.D. DATE OF BIRTH:  24-Apr-1956  DATE OF PROCEDURE:  08/06/2015 DATE OF DISCHARGE:                              OPERATIVE REPORT   PREOPERATIVE DIAGNOSES: 1. 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 2. Spondylolisthesis L4-5 with radicular right leg pain L4-L5     distribution.  POSTOPERATIVE DIAGNOSES: 1. 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 2. Spondylolisthesis L4-5 with radicular right leg pain L4-L5     distribution.  PROCEDURE:  Transforaminal lumbar interbody fusion, L4-5.  INSTRUMENTATION SYSTEM USED:  NuVasive MIS pedicle screw system with 45 mm length screws, 6.5 mm diameter at the L4 and L5 bilaterally with the Titan Titanium size 11 extra long intervertebral cage packed with autograft plus DBX.  COMPLICATIONS:  No adverse intraoperative neuromonitoring events.  HISTORY:  This is a very pleasant 60 year old gentleman who was having debilitating back, buttock, and radicular right leg pain.  Attempts at conservative management including injection therapy, physiotherapy, activity modification, and nonnarcotic medications have failed to alleviate his symptoms.  Because of the ongoing pain, we elected to proceed with a surgical treatment for his problem.  All appropriate risks, benefits, and alternatives were discussed with the patient and consent was obtained.  FIRST ASSISTANT:  Anette Riedel, my PA  OPERATIVE NOTE:  The patient was brought to the operating room, placed supine on the operating table.  After successful induction of general anesthesia and endotracheal intubation, TEDs, SCDs, and a Foley were inserted.  Once this was completed, he was turned prone onto the [place] frame.  All bony prominences were well padded and the neuromonitoring pads were placed.  The back was  then prepped and draped in standard fashion.  Time-out was taken to confirm patient, procedure, and all other pertinent important data.  Once this was completed, on the left hand side, I identified the lateral border of the L4 and L5 pedicle, and I infiltrated this area with 0.25% Marcaine.  I did a similar infiltration on the right side.  Since his radicular leg pain was on the right side, I elected to do the open decompression portion with the insertion of the cage on this side.  This allowed for direct neural decompression.  Therefore, I started on the left.  Small incisions were made on the lateral side of the L4-L5 pedicle.  Jamshidi needle was advanced percutaneously down to the lateral border of the facet complex. I identified the appropriate starting position on fluoro in the AP plane.  Once this was done, I connected the neuromonitoring device to the Jamshidi needle and began advancing the Jamshidi needle into the L4 pedicle.  I confirmed that there was no abnormal free running EMGs while inserting the Jamshidi.  Once the needle was close to the medial border of the pedicle on the AP view, I switched to the lateral and I confirmed that I was just beyond the posterior margin of the vertebral body. Satisfied with my trajectory in positioning and the fact that I was not breaching the pedicle, I advanced  into the vertebral body.  I then aspirated 10 mL of marrow and mixed this with the allograft CONFORM sheet.  I then placed my guide pin to cannulate the pedicle.  I repeated this procedure at L5.  Once both pedicles were cannulated, I tapped over each guide pin and then placed a 45 mm length pedicle screw.  Once pedicle screw was positioned, I confirmed satisfactory positioning with fluoro views in the AP and lateral planes and then stimulated both screws to confirm there was no breach of the pedicle.  Once this was completed, I went to the right-hand side.  I identified the  lateral borders of the pedicles and made a Wiltse incision.  Sharp dissection was carried out down to the deep fascia and I incised the deep fascia. I bluntly dissected through the paraspinal muscles until I could palpate the L3-4 facet complex.  Using the same technique, I used on the contralateral side, I placed my Jamshidi needles into the open wound and advanced them into the pedicle again using fluoro and neuromonitoring. Once Jamshidi needles were across the posterior aspect of the vertebral body, I placed my cannulas.  Once both pedicles were cannulated, I then tapped and then placed my pedicle screw which was secured to the lateral retracting blade.  I then stimulated both screws and again there was no electrodiagnostic evidence of breach.  I then constructed my retracting device based off the pedicle and mobilized the medial portion of the paraspinal muscles had remained.  Medial retracting blade was placed and now I had excellent visualization of the posterolateral aspect of the spine.  Using an osteotome, I removed the bulk of the inferior L4 facet complex.  I then used a 3 mm Kerrison rongeur to perform a generous laminotomy of L4.  At this point, I then released the ligamentum flavum and removed the ligamentum flavum to expose the posterolateral aspect of the thecal sac.  I continued into the lateral recess until I could palpate and visualize the medial border of the L5 pedicle.  I then traced the L5 nerve root down past the pedicle into the foramen ensuring it was adequately decompressed.  There was a significant amount of osteophytic changes of the L5 superior facet, which I removed using osteotome.  I coagulated the epidural veins that were visualized and then continued to resect the L4 pars.  I could now visualize the L4 nerve root in the foramen and palpate the inferior aspect of the L4 pedicle.  Again, there was no evidence of breach.  At this point, I had excellent  exposure of the posterolateral aspect of the disk and adequate decompression of the L4 and L5 nerve roots.  The thecal sac itself was also adequately decompressed.  At this point, neural patties were then placed into the wound to retract and protect the 4 and 5 nerve root and the thecal sac was gently retracted medially with a neural root retractor.  A generous annulotomy was performed with a 15 blade scalpel and then I used a combination of pituitary rongeurs and curettes to remove the disk material.  Side cutting curettes were used aggressively within the disk space removing the majority of the disk itself.  I then used a side-cutting shaver to ensure I had adequate diskectomy.  At this point, I was able to take my [facility] elevator and palpated along the endplates of both L4 and L5 to confirm that I had removed all of the cartilaginous endplate, and I had bleeding  subchondral bone and there was no further disk material in the disk space.  At this point, I irrigated the wound copiously with normal saline and then placed my CONFORM sheath along the anterior annulus.  I also placed some remaining bone graft anteriorly for improved overall fusion range.  I then obtained my size 11 extra long cage, packed it with the remaining portion of local bone of autograft bone and mixed it with DBX.  With the thecal sac protected, I malleted the device into the intervertebral space and I was able to push it over to the contralateral side and placed it into a horizontal position in the anterior 3rd of the disk space.  X-rays were satisfactory.  The cage was across the midline and well-seated and firmly positioned.  At this point, I removed the kyphosis that was built into the [place] frame and then attached the polyaxial heads to the screws.  I removed the retractor blades and advanced the screws, so they were at the appropriate depth.  I then measured and placed a contoured rod and secured the locking  caps according to manufacturer's standards.  Both caps were torqued off appropriately.  I then went back to the left hand side and measured and placed a rod and then the locking caps and then torqued off the locking caps according to manufacturer's standards.  At this point with the rod pedicle screw construct secured and the intervertebral body properly positioned, I irrigated all the wounds copiously with normal saline.  I made sure I had hemostasis on the open TLIF side using bipolar. Thrombin- soaked Gelfoam patty was placed over the laminotomy defect and I closed the wounds in a layered fashion with interrupted #1 Vicryl sutures for the deep fascia, 2-0 Vicryl sutures for superficial, and a 3-0 Monocryl for the skin.  Steri-Strips and dry dressing were applied.  Ultimately, the patient was extubated, transferred to PACU without incident.  At the end of the case, all needle and sponge counts were correct.  Final x-rays were satisfactory.  Hardware and graft were in good position.  No intraoperative complications.     Riana Tessmer D. Shon Baton, M.D.     DDB/MEDQ  D:  08/06/2015  T:  08/06/2015  Job:  [id number]  cc:   Debria Garret D. Shon Baton, M.D.'s Office

## 2015-08-06 NOTE — Anesthesia Preprocedure Evaluation (Signed)
Anesthesia Evaluation  Patient identified by MRN, date of birth, ID band Patient awake    Reviewed: Allergy & Precautions, NPO status , Patient's Chart, lab work & pertinent test results  History of Anesthesia Complications Negative for: history of anesthetic complications  Airway Mallampati: IV  TM Distance: >3 FB Neck ROM: Full    Dental  (+) Teeth Intact   Pulmonary sleep apnea and Continuous Positive Airway Pressure Ventilation ,    breath sounds clear to auscultation       Cardiovascular hypertension, Pt. on medications  Rhythm:Regular     Neuro/Psych negative neurological ROS     GI/Hepatic Neg liver ROS, GERD  Controlled,  Endo/Other  Morbid obesity  Renal/GU negative Renal ROS     Musculoskeletal   Abdominal   Peds  Hematology negative hematology ROS (+)   Anesthesia Other Findings   Reproductive/Obstetrics                             Anesthesia Physical Anesthesia Plan  ASA: II  Anesthesia Plan: General   Post-op Pain Management:    Induction: Intravenous  Airway Management Planned: Oral ETT  Additional Equipment: None  Intra-op Plan:   Post-operative Plan: Extubation in OR  Informed Consent: I have reviewed the patients History and Physical, chart, labs and discussed the procedure including the risks, benefits and alternatives for the proposed anesthesia with the patient or authorized representative who has indicated his/her understanding and acceptance.   Dental advisory given  Plan Discussed with: CRNA and Surgeon  Anesthesia Plan Comments:         Anesthesia Quick Evaluation

## 2015-08-06 NOTE — Anesthesia Procedure Notes (Signed)
Procedure Name: Intubation Date/Time: 08/06/2015 1:33 PM Performed by: Scheryl Darter Pre-anesthesia Checklist: Patient identified, Emergency Drugs available, Suction available, Patient being monitored and Timeout performed Patient Re-evaluated:Patient Re-evaluated prior to inductionOxygen Delivery Method: Circle system utilized Preoxygenation: Pre-oxygenation with 100% oxygen Intubation Type: IV induction Ventilation: Mask ventilation without difficulty Laryngoscope Size: Miller and 3 Grade View: Grade II Tube type: Oral Tube size: 8.0 mm Number of attempts: 1 Airway Equipment and Method: Stylet and LTA kit utilized Placement Confirmation: ETT inserted through vocal cords under direct vision,  positive ETCO2 and breath sounds checked- equal and bilateral Secured at: 23 cm Tube secured with: Tape Dental Injury: Teeth and Oropharynx as per pre-operative assessment

## 2015-08-06 NOTE — Transfer of Care (Signed)
Immediate Anesthesia Transfer of Care Note  Patient: Jordan Mcconnell  Procedure(s) Performed: Procedure(s): TRANSFORAMINAL LUMBAR INTERBODY FUSION (TLIF) WITH PEDICLE SCREW FIXATION L4 - L5  1 LEVEL (N/A)  Patient Location: PACU  Anesthesia Type:General  Level of Consciousness: awake  Airway & Oxygen Therapy: Patient Spontanous Breathing and Patient connected to nasal cannula oxygen  Post-op Assessment: Report given to RN and Post -op Vital signs reviewed and stable  Post vital signs: stable  Last Vitals:  Filed Vitals:   08/06/15 0956 08/06/15 1637  BP: 154/78   Pulse: 64   Temp: 36.7 C 36.7 C  Resp: 18     Complications: No apparent anesthesia complications

## 2015-08-06 NOTE — Brief Op Note (Signed)
08/06/2015  4:23 PM  PATIENT:  Darcella Gasman Patron  60 y.o. male  PRE-OPERATIVE DIAGNOSIS:  Grade I Slip with Right Radicular Leg Pain  POST-OPERATIVE DIAGNOSIS:  Grade I Slip with Right Radicular Leg Pain  PROCEDURE:  Procedure(s): TRANSFORAMINAL LUMBAR INTERBODY FUSION (TLIF) WITH PEDICLE SCREW FIXATION L4 - L5  1 LEVEL (N/A)  SURGEON:  Surgeon(s) and Role:    * Venita Lick, MD - Primary  PHYSICIAN ASSISTANT:   ASSISTANTS: Carmen Mayo   ANESTHESIA:   general  EBL:  Total I/O In: 1500 [I.V.:1500] Out: 550 [Urine:300; Blood:250]  BLOOD ADMINISTERED:none  DRAINS: none   LOCAL MEDICATIONS USED:  MARCAINE     SPECIMEN:  No Specimen  DISPOSITION OF SPECIMEN:  N/A  COUNTS:  YES  TOURNIQUET:  * No tourniquets in log *  DICTATION: .Other Dictation: Dictation Number [id number]  PLAN OF CARE: Admit to inpatient   PATIENT DISPOSITION:  PACU - hemodynamically stable.

## 2015-08-07 ENCOUNTER — Inpatient Hospital Stay (HOSPITAL_COMMUNITY): Payer: PRIVATE HEALTH INSURANCE

## 2015-08-07 MED ORDER — DOCUSATE SODIUM 100 MG PO CAPS
100.0000 mg | ORAL_CAPSULE | Freq: Two times a day (BID) | ORAL | Status: DC
  Administered 2015-08-07 – 2015-08-08 (×3): 100 mg via ORAL
  Filled 2015-08-07 (×3): qty 1

## 2015-08-07 MED ORDER — SENNA 8.6 MG PO TABS
1.0000 | ORAL_TABLET | Freq: Two times a day (BID) | ORAL | Status: DC
Start: 2015-08-07 — End: 2015-08-09
  Administered 2015-08-07 – 2015-08-08 (×3): 8.6 mg via ORAL
  Filled 2015-08-07 (×3): qty 1

## 2015-08-07 MED ORDER — POLYETHYLENE GLYCOL 3350 17 G PO PACK
17.0000 g | PACK | Freq: Every day | ORAL | Status: DC | PRN
  Administered 2015-08-07: 17 g via ORAL
  Filled 2015-08-07: qty 1

## 2015-08-07 MED FILL — Thrombin For Soln 20000 Unit: CUTANEOUS | Qty: 1 | Status: AC

## 2015-08-07 NOTE — Progress Notes (Signed)
Pt ambulated this morning in room with brace and walker. Pain medication given prior to ambulation. Foley discontinued at 0530. Pt due to void. Will continue to monitor.

## 2015-08-07 NOTE — Anesthesia Postprocedure Evaluation (Signed)
Anesthesia Post Note  Patient: Jordan Mcconnell  Procedure(s) Performed: Procedure(s) (LRB): TRANSFORAMINAL LUMBAR INTERBODY FUSION (TLIF) WITH PEDICLE SCREW FIXATION L4 - L5  1 LEVEL (N/A)  Patient location during evaluation: PACU Anesthesia Type: General Level of consciousness: awake Pain management: pain level controlled Vital Signs Assessment: post-procedure vital signs reviewed and stable Respiratory status: spontaneous breathing Cardiovascular status: stable Postop Assessment: no signs of nausea or vomiting Anesthetic complications: no    Last Vitals:  Filed Vitals:   08/07/15 0125 08/07/15 0510  BP: 149/74 135/64  Pulse: 80 73  Temp: 36.6 C 37 C  Resp: 20 20    Last Pain:  Filed Vitals:   08/07/15 0530  PainSc: 7                  Willa Brocks

## 2015-08-07 NOTE — Evaluation (Signed)
Occupational Therapy Evaluation Patient Details Name: Jordan Mcconnell MRN: [id number] DOB: May 04, 1956 Today's Date: 08/07/2015    History of Present Illness Pt is a 60 y.o. male s/p TLIF WITH PEDICLE SCREW FIXATION L4 - L5 1 LEVEL.    Clinical Impression   Pt reports he was independent with ADLs and mobility PTA. Currently pt is a close min guard for functional mobility and ADLs with min assist for LB bathing and donning back brace. Began safety, back, and ADL education with pt; he verbalized understanding. Pt planning to d/c home with 24/7 supervision from family. Pt would benefit from continued skilled OT to address established goals.    Follow Up Recommendations  No OT follow up;Supervision/Assistance - 24 hour    Equipment Recommendations  None recommended by OT    Recommendations for Other Services PT consult     Precautions / Restrictions Precautions Precautions: Back Precaution Booklet Issued: Yes (comment) Precaution Comments: Educated pt on back precautions. Required Braces or Orthoses: Spinal Brace Spinal Brace: Lumbar corset;Applied in sitting position Restrictions Weight Bearing Restrictions: No      Mobility Bed Mobility               General bed mobility comments: Pt sitting EOB upon arrival.  Transfers Overall transfer level: Needs assistance Equipment used: None Transfers: Sit to/from Stand Sit to Stand: Min guard         General transfer comment: Min guard for safety. Pt unsteady on feet with initial sit to stand from EOB but improved with functional mobility. Good hand placement and technique.    Balance Overall balance assessment: Needs assistance Sitting-balance support: Feet supported;No upper extremity supported Sitting balance-Leahy Scale: Good     Standing balance support: No upper extremity supported;During functional activity Standing balance-Leahy Scale: Fair                              ADL Overall ADL's :  Needs assistance/impaired Eating/Feeding: Set up;Sitting   Grooming: Min guard;Wash/dry hands;Standing Grooming Details (indicate cue type and reason): Close min guard for safety with grooming in standing. Educated on use of cup for oral care.     Lower Body Bathing: Minimal assistance;Sit to/from stand   Upper Body Dressing : Minimal assistance;Sitting Upper Body Dressing Details (indicate cue type and reason): For donning back brace. Lower Body Dressing: Min guard;Cueing for back precautions;Sit to/from stand Lower Body Dressing Details (indicate cue type and reason): Pt able to cross one foot over opposite knee; no need for AE. Pt reports wife or mother can assist with ADLs as needed. Toilet Transfer: Min guard;Ambulation;Regular Toilet;Grab bars Toilet Transfer Details (indicate cue type and reason): Close min guard for safety, pt able to maintain precautions with transfer Toileting- Clothing Manipulation and Hygiene: Supervision/safety;Sitting/lateral lean (for toilet hygiene)       Functional mobility during ADLs: Min guard General ADL Comments: No family present for OT eval. Educated on back precautions in relation to ADL/IADL activities, keeping frequently used items at counter top height, supervision for tub transfer, sitting up for only ~45 minutes at a time; pt verbalized understanding.     Vision     Perception     Praxis      Pertinent Vitals/Pain Pain Assessment: Faces Faces Pain Scale: Hurts even more Pain Location: back, incision Pain Descriptors / Indicators: Aching;Burning;Sore Pain Intervention(s): Limited activity within patient's tolerance;Monitored during session;Repositioned;Premedicated before session     Hand Dominance Right  Extremity/Trunk Assessment Upper Extremity Assessment Upper Extremity Assessment: Overall WFL for tasks assessed   Lower Extremity Assessment Lower Extremity Assessment: Defer to PT evaluation   Cervical / Trunk  Assessment Cervical / Trunk Assessment: Other exceptions Cervical / Trunk Exceptions: pt s/p lumbar sx   Communication Communication Communication: No difficulties   Cognition Arousal/Alertness: Awake/alert Behavior During Therapy: WFL for tasks assessed/performed Overall Cognitive Status: Within Functional Limits for tasks assessed                     General Comments       Exercises       Shoulder Instructions      Home Living Family/patient expects to be discharged to:: Private residence Living Arrangements: Spouse/significant other Available Help at Discharge: Family;Available 24 hours/day Type of Home: House Home Access: Stairs to enter [employer] of Steps: 4-5 Entrance Stairs-Rails: None Home Layout: One level     Bathroom Shower/Tub: Tub/shower unit;Curtain Shower/tub characteristics: Engineer, building services: Standard Bathroom Accessibility: Yes How Accessible: Accessible via walker Home Equipment: Other (comment);Cane - single point (Family members borrowing BSC and RW)          Prior Functioning/Environment Level of Independence: Independent             OT Diagnosis: Generalized weakness;Acute pain   OT Problem List: Decreased strength;Decreased activity tolerance;Impaired balance (sitting and/or standing);Decreased safety awareness;Decreased knowledge of use of DME or AE;Decreased knowledge of precautions;Obesity;Pain   OT Treatment/Interventions: Self-care/ADL training;Energy conservation;DME and/or AE instruction;Patient/family education;Balance training    OT Goals(Current goals can be found in the care plan section) Acute Rehab OT Goals Patient Stated Goal: return to PLOF OT Goal Formulation: With patient Time For Goal Achievement: 08/21/15 Potential to Achieve Goals: Good ADL Goals Pt Will Perform Grooming: with supervision;standing Pt Will Perform Tub/Shower Transfer: with supervision;Tub transfer;ambulating Additional  ADL Goal #1: Pt will independently don/doff back brace. Additional ADL Goal #2: Pt will verbally recall 3/3 back precautions and maintain throughout ADL activity.  OT Frequency: Min 2X/week   Barriers to D/C:            Co-evaluation              End of Session Equipment Utilized During Treatment: Back brace Nurse Communication: Mobility status (RN in room for functional mobility)  Activity Tolerance: Patient tolerated treatment well Patient left: in chair;with call bell/phone within reach   Time: 0800-0816 OT Time Calculation (min): 16 min Charges:  OT General Charges $OT Visit: 1 Procedure OT Evaluation $OT Eval Moderate Complexity: 1 Procedure G-Codes:     Gaye Alken M.S., OTR/L Pager: [phone]  08/07/2015, 8:29 AM

## 2015-08-07 NOTE — Care Management Note (Signed)
Case Management Note  Patient Details  Name: Jordan Mcconnell MRN: [id number] Date of Birth: 1956-02-28  Subjective/Objective:                    Action/Plan: Patient was admitted for a TRANSFORAMINAL LUMBAR INTERBODY FUSION (TLIF) WITH PEDICLE SCREW FIXATION L4 - L5 1 LEVEL. Lives at home with spouse. Will follow for discharge needs pending PT/OT evals and physician orders.  Expected Discharge Date:                  Expected Discharge Plan:     In-House Referral:     Discharge planning Services     Post Acute Care Choice:    Choice offered to:     DME Arranged:    DME Agency:     HH Arranged:    HH Agency:     Status of Service:  In process, will continue to follow  Medicare Important Message Given:    Date Medicare IM Given:    Medicare IM give by:    Date Additional Medicare IM Given:    Additional Medicare Important Message give by:     If discussed at Long Length of Stay Meetings, dates discussed:    Additional Comments:  Anda Kraft, RN 08/07/2015, 11:43 AM [phone]

## 2015-08-07 NOTE — Evaluation (Signed)
Physical Therapy Evaluation Patient Details Name: Jordan Mcconnell MRN: [id number] DOB: [birth year] Today's Date: 08/07/2015   History of Present Illness  Pt is a 60 y.o. male s/p TLIF WITH PEDICLE SCREW FIXATION L4 - L5 1 LEVEL. PMHx-Bil TKR  Clinical Impression  Patient is s/p above surgery resulting in the deficits listed below (see PT Problem List). Patient mobilizing well with only minimal cues needed to adhere to back precautions while mobilizing. Patient will benefit from skilled PT to increase their independence and safety with mobility (while adhering to their precautions) to allow discharge to the venue listed below.     Follow Up Recommendations No PT follow up    Equipment Recommendations  None recommended by PT    Recommendations for Other Services       Precautions / Restrictions Precautions Precautions: Back Precaution Booklet Issued: Yes (comment) Precaution Comments: Pt able to recall all precautions from earlier OT session. vc needed to adhere during mobility Required Braces or Orthoses: Spinal Brace Spinal Brace: Lumbar corset;Applied in sitting position Restrictions Weight Bearing Restrictions: No      Mobility  Bed Mobility Overal bed mobility: Needs Assistance Bed Mobility: Rolling;Sidelying to Sit;Sit to Sidelying Rolling: Supervision Sidelying to sit: Min guard     Sit to sidelying: Min guard General bed mobility comments: vc to adhere to back precautions (esp no twisting)  Transfers Overall transfer level: Needs assistance Equipment used: None Transfers: Sit to/from Stand Sit to Stand: Min guard         General transfer comment: Min guard for safety.   Ambulation/Gait Ambulation/Gait assistance: Min assist;Min guard Ambulation Distance (Feet): 200 Feet Assistive device: None Gait Pattern/deviations: Step-through pattern;Decreased stride length   Gait velocity interpretation: Below normal speed for age/gender    Stairs Stairs:  Yes Stairs assistance: Min guard Stair Management: No rails;Step to pattern;Sideways Number of Stairs: 5 General stair comments: pt demonstrated his technique he has used PTA; pt steady and did adhere to precautions  Wheelchair Mobility    Modified Rankin (Stroke Patients Only)       Balance Overall balance assessment: Needs assistance Sitting-balance support: Feet supported;No upper extremity supported Sitting balance-Leahy Scale: Good     Standing balance support: No upper extremity supported;During functional activity Standing balance-Leahy Scale: Good                               Pertinent Vitals/Pain Pain Assessment: 0-10 Pain Score: 4  Faces Pain Scale: Hurts even more Pain Location: back Pain Descriptors / Indicators: Operative site guarding Pain Intervention(s): Limited activity within patient's tolerance;Monitored during session;Premedicated before session;Repositioned    Home Living Family/patient expects to be discharged to:: Private residence Living Arrangements: Spouse/significant other Available Help at Discharge: Family;Available 24 hours/day Type of Home: House Home Access: Stairs to enter Entrance Stairs-Rails: None Entrance Stairs-Number of Steps: 4-5 Home Layout: One level Home Equipment: Other (comment);Cane - single point (Family members borrowing BSC and RW)      Prior Function Level of Independence: Independent               Hand Dominance   Dominant Hand: Right    Extremity/Trunk Assessment   Upper Extremity Assessment: Defer to OT evaluation           Lower Extremity Assessment: Overall WFL for tasks assessed      Cervical / Trunk Assessment: Other exceptions  Communication   Communication: No difficulties  Cognition Arousal/Alertness: Awake/alert  Behavior During Therapy: WFL for tasks assessed/performed Overall Cognitive Status: Within Functional Limits for tasks assessed                       General Comments      Exercises        Assessment/Plan    PT Assessment Patient needs continued PT services  PT Diagnosis Difficulty walking;Acute pain   PT Problem List Decreased mobility;Decreased knowledge of use of DME;Decreased knowledge of precautions;Pain  PT Treatment Interventions DME instruction;Gait training;Stair training;Functional mobility training;Therapeutic activities;Patient/family education   PT Goals (Current goals can be found in the Care Plan section) Acute Rehab PT Goals Patient Stated Goal: return to PLOF PT Goal Formulation: With patient Time For Goal Achievement: 08/09/15 Potential to Achieve Goals: Good    Frequency Min 5X/week   Barriers to discharge        Co-evaluation               End of Session Equipment Utilized During Treatment: Gait belt;Back brace Activity Tolerance: Patient tolerated treatment well Patient left: in bed;with call bell/phone within reach;with bed alarm set           Time: 1610-9604 PT Time Calculation (min) (ACUTE ONLY): 24 min   Charges:   PT Evaluation $PT Eval Low Complexity: 1 Procedure PT Treatments $Gait Training: 8-22 mins   PT G Codes:        Marthann Abshier 28-Aug-2015, 10:59 AM Pager [phone]

## 2015-08-07 NOTE — Progress Notes (Signed)
Patient ID: Jordan Mcconnell, male   DOB: 09-14-1955, 60 y.o.   MRN: [id number]    Subjective: 1 Day Post-Op Procedure(s) (LRB): TRANSFORAMINAL LUMBAR INTERBODY FUSION (TLIF) WITH PEDICLE SCREW FIXATION L4 - L5  1 LEVEL (N/A) Patient reports pain as 7 on 0-10 scale.   Denies CP or SOB.  Pt just had foley removed. Positive flatus. Objective: Vital signs in last 24 hours: Temp:  [97.4 F (36.3 C)-98.6 F (37 C)] 98.6 F (37 C) (02/02 0510) Pulse Rate:  [64-91] 73 (02/02 0510) Resp:  [7-24] 20 (02/02 0510) BP: (135-158)/(64-94) 135/64 mmHg (02/02 0510) SpO2:  [95 %-99 %] 97 % (02/02 0510) Weight:  [129.683 kg (285 lb 14.4 oz)] 129.683 kg (285 lb 14.4 oz) (02/01 0956)  Intake/Output from previous day: 02/01 0701 - 02/02 0700 In: 1600 [I.V.:1600] Out: 1800 [Urine:1550; Blood:250] Intake/Output this shift:    Labs: No results for input(s): HGB in the last 72 hours. No results for input(s): WBC, RBC, HCT, PLT in the last 72 hours. No results for input(s): NA, K, CL, CO2, BUN, CREATININE, GLUCOSE, CALCIUM in the last 72 hours. No results for input(s): LABPT, INR in the last 72 hours.  Physical Exam: Neurologically intact ABD soft Sensation intact distally Dorsiflexion/Plantar flexion intact Compartment soft  Assessment/Plan: 1 Day Post-Op Procedure(s) (LRB): TRANSFORAMINAL LUMBAR INTERBODY FUSION (TLIF) WITH PEDICLE SCREW FIXATION L4 - L5  1 LEVEL (N/A) Advance diet Up with therapy  Consider DC today or tomorrow if pain is controlled and cleared by PT  Mayo, Baxter Kail for Dr. Venita Lick [facility] [phone] 08/07/2015, 7:42 AM

## 2015-08-08 MED ORDER — MAGNESIUM CITRATE PO SOLN
0.5000 | Freq: Once | ORAL | Status: AC
  Administered 2015-08-08: 0.5 via ORAL
  Filled 2015-08-08: qty 296

## 2015-08-08 MED ORDER — FLEET ENEMA 7-19 GM/118ML RE ENEM: 1.0000 | ENEMA | Freq: Once | RECTAL | Status: DC

## 2015-08-08 NOTE — Progress Notes (Signed)
Physical Therapy Treatment Patient Details Name: Jordan Mcconnell MRN: [id number] DOB: 1956-02-13 Today's Date: 08/08/2015    History of Present Illness Pt is a 60 y.o. male s/p TLIF WITH PEDICLE SCREW FIXATION L4 - L5 1 LEVEL. PMHx-Bil TKR    PT Comments    Patient is mobilizing well. Adheres to precautions with basic mobility. Discussed activity expectations for next 6-8 weeks is walking only for exercise (unless MD clears him sooner). Patient with no further questions relative to PT. Will discharge from PT.   Follow Up Recommendations  No PT follow up     Equipment Recommendations  None recommended by PT    Recommendations for Other Services       Precautions / Restrictions Precautions Precautions: Back Precaution Comments: recalls precautions; no cues needed; asking about how to perform pericare after BM (referred him to ask OT) Required Braces or Orthoses: Spinal Brace Spinal Brace: Lumbar corset;Applied in sitting position Restrictions Weight Bearing Restrictions: No    Mobility  Bed Mobility Overal bed mobility: Modified Independent Bed Mobility: Rolling;Sidelying to Sit Rolling: Modified independent (Device/Increase time) Sidelying to sit: Modified independent (Device/Increase time)       General bed mobility comments: HOB flat, no rail  Transfers Overall transfer level: Modified independent Equipment used: None Transfers: Sit to/from Stand Sit to Stand: Modified independent (Device/Increase time)         General transfer comment: educated on car transfer as well  Ambulation/Gait Ambulation/Gait assistance: Modified independent (Device/Increase time) Ambulation Distance (Feet): 500 Feet Assistive device: None Gait Pattern/deviations: Antalgic;WFL(Within Functional Limits)   Gait velocity interpretation: Below normal speed for age/gender General Gait Details: slightly decr velocity; began to have pain in Rt lower back, buttock with slight antalgic  gait   Stairs            Wheelchair Mobility    Modified Rankin (Stroke Patients Only)       Balance     Sitting balance-Leahy Scale: Good       Standing balance-Leahy Scale: Good                      Cognition Arousal/Alertness: Awake/alert Behavior During Therapy: WFL for tasks assessed/performed Overall Cognitive Status: Within Functional Limits for tasks assessed                      Exercises      General Comments        Pertinent Vitals/Pain Pain Assessment: Faces Faces Pain Scale: Hurts little more Pain Location: Rt buttock Pain Descriptors / Indicators: Aching Pain Intervention(s): Limited activity within patient's tolerance;Premedicated before session;Repositioned    Home Living                      Prior Function            PT Goals (current goals can now be found in the care plan section) Acute Rehab PT Goals Patient Stated Goal: return to PLOF PT Goal Formulation: With patient Time For Goal Achievement: 08/09/15 Potential to Achieve Goals: Good Progress towards PT goals: Goals met/education completed, patient discharged from PT    Frequency       PT Plan Current plan remains appropriate    Co-evaluation             End of Session Equipment Utilized During Treatment: Gait belt;Back brace Activity Tolerance: Patient tolerated treatment well Patient left: with call bell/phone within reach;in chair  Time: 6606-3016 PT Time Calculation (min) (ACUTE ONLY): 22 min  Charges:  $Gait Training: 8-22 mins                    G Codes:      Elliett Guarisco 22-Aug-2015, 9:01 AM  Pager [phone]

## 2015-08-08 NOTE — Progress Notes (Signed)
    Subjective: Procedure(s) (LRB): TRANSFORAMINAL LUMBAR INTERBODY FUSION (TLIF) WITH PEDICLE SCREW FIXATION L4 - L5  1 LEVEL (N/A) 2 Days Post-Op  Patient reports pain as 3 on 0-10 scale.  Reports decreased leg pain reports incisional back pain   Positive void Negative bowel movement Positive flatus Negative chest pain or shortness of breath  Objective: Vital signs in last 24 hours: Temp:  [97.8 F (36.6 C)-98.9 F (37.2 C)] 98.1 F (36.7 C) (02/03 0534) Pulse Rate:  [64-89] 64 (02/03 0534) Resp:  [16-20] 20 (02/03 0534) BP: (112-132)/(49-83) 122/62 mmHg (02/03 0534) SpO2:  [96 %-99 %] 96 % (02/03 0534)  Intake/Output from previous day: 02/02 0701 - 02/03 0700 In: 1200 [P.O.:1200] Out: -   Labs: No results for input(s): WBC, RBC, HCT, PLT in the last 72 hours. No results for input(s): NA, K, CL, CO2, BUN, CREATININE, GLUCOSE, CALCIUM in the last 72 hours. No results for input(s): LABPT, INR in the last 72 hours.  Physical Exam: Neurologically intact ABD soft Intact pulses distally Incision: dressing C/D/I Compartment soft  Assessment/Plan: Patient stable  xrays satisfactory Continue mobilization with physical therapy Continue care  Advance diet Up with therapy  Constipation is an issue - will give enema and mag citrate Plan on d/c in AM if positive BM today  Venita Lick, MD [facility] [phone]

## 2015-08-08 NOTE — Progress Notes (Signed)
CM called and left message for Jordan Mcconnell with [facility] informing her of potential discharge tomorrow. Currently there are no follow up needs per PT/OT and no equipment needs. This information was left on her confidential voice mail. CM will continue to follow for any discharge needs.

## 2015-08-08 NOTE — Progress Notes (Signed)
Occupational Therapy Treatment Patient Details Name: Jordan Mcconnell MRN: [id number] DOB: [birth year] Today's Date: 08/08/2015    History of present illness Pt is a 60 y.o. male s/p TLIF WITH PEDICLE SCREW FIXATION L4 - L5 1 LEVEL. PMHx-Bil TKR   OT comments  Pt is able to perform ADLs at supervision level.  He demonstrates good awareness of back precautions.  Reviewed options for AE for LB ADLs and toileting   Follow Up Recommendations  No OT follow up;Supervision/Assistance - 24 hour    Equipment Recommendations  None recommended by OT    Recommendations for Other Services      Precautions / Restrictions Precautions Precautions: Back Precaution Comments: Pt able to verbalize 3/3 back precautions  Required Braces or Orthoses: Spinal Brace Spinal Brace: Lumbar corset;Applied in sitting position       Mobility Bed Mobility Overal bed mobility: Modified Independent                Transfers Overall transfer level: Modified independent                    Balance             Standing balance-Leahy Scale: Good                     ADL Overall ADL's : Needs assistance/impaired             Lower Body Bathing: Modified independent;Sit to/from stand;With adaptive equipment Lower Body Bathing Details (indicate cue type and reason): Pt has long handled sponge/brush     Lower Body Dressing: Supervision/safety;Sit to/from stand Lower Body Dressing Details (indicate cue type and reason): reviewed proper technique Toilet Transfer: Modified Independent;Ambulation;Comfort height toilet;RW;Grab bars   Toileting- Clothing Manipulation and Hygiene: Supervision/safety;With adaptive equipment;Adhering to back precautions;Sit to/from stand Toileting - Clothing Manipulation Details (indicate cue type and reason): reviewed options for toileting aid with pt and spouse as pt unable to access peri area without twisting    Tub/Shower Transfer Details (indicate  cue type and reason): Reviewed safe technique for tub transfer  Functional mobility during ADLs: Modified independent;Rolling walker General ADL Comments: Reviewed sitting for only ~45 mins periods, use of AE, how to safely maneuver around kitchen and lifting restrictions.  Pt and wife verbalized understanding       Vision                     Perception     Praxis      Cognition   Behavior During Therapy: WFL for tasks assessed/performed Overall Cognitive Status: Within Functional Limits for tasks assessed                       Extremity/Trunk Assessment               Exercises     Shoulder Instructions       General Comments      Pertinent Vitals/ Pain       Pain Assessment: Faces Faces Pain Scale: Hurts little more Pain Location: Rt hip/buttock  Pain Descriptors / Indicators: Aching;Guarding;Grimacing Pain Intervention(s): Monitored during session  Home Living                                          Prior Functioning/Environment  Frequency Min 2X/week     Progress Toward Goals  OT Goals(current goals can now be found in the care plan section)  Progress towards OT goals: Progressing toward goals  ADL Goals Pt Will Perform Grooming: with supervision;standing Pt Will Perform Tub/Shower Transfer: with supervision;Tub transfer;ambulating Additional ADL Goal #1: Pt will independently don/doff back brace. Additional ADL Goal #2: Pt will verbally recall 3/3 back precautions and maintain throughout ADL activity.  Plan Discharge plan remains appropriate    Co-evaluation                 End of Session Equipment Utilized During Treatment: Back brace   Activity Tolerance Patient tolerated treatment well   Patient Left in bed;with call bell/phone within reach;with family/visitor present   Nurse Communication Mobility status        Time: 1610-9604 OT Time Calculation (min): 38 min  Charges: OT  General Charges $OT Visit: 1 Procedure OT Treatments $Self Care/Home Management : 38-52 mins  Jordan Mcconnell M 08/08/2015, 7:58 PM

## 2015-08-09 MED ORDER — OXYCODONE-ACETAMINOPHEN 10-325 MG PO TABS: 1.0000 | ORAL_TABLET | ORAL | Status: DC | PRN

## 2015-08-09 MED ORDER — ONDANSETRON HCL 4 MG PO TABS: 4.0000 mg | ORAL_TABLET | Freq: Three times a day (TID) | ORAL | Status: DC | PRN

## 2015-08-09 MED ORDER — METHOCARBAMOL 500 MG PO TABS: 500.0000 mg | ORAL_TABLET | Freq: Three times a day (TID) | ORAL | Status: DC | PRN

## 2015-08-09 NOTE — Progress Notes (Signed)
    Subjective: Procedure(s) (LRB): TRANSFORAMINAL LUMBAR INTERBODY FUSION (TLIF) WITH PEDICLE SCREW FIXATION L4 - L5  1 LEVEL (N/A) 3 Days Post-Op  Patient reports pain as 2 on 0-10 scale.  Reports decreased leg pain reports incisional back pain   Positive void Positive bowel movement Positive flatus Negative chest pain or shortness of breath  Objective: Vital signs in last 24 hours: Temp:  [98 F (36.7 C)-98.8 F (37.1 C)] 98.8 F (37.1 C) (02/04 0526) Pulse Rate:  [73-105] 73 (02/04 0526) Resp:  [20] 20 (02/04 0526) BP: (108-137)/(64-73) 108/65 mmHg (02/04 0526) SpO2:  [94 %-96 %] 96 % (02/04 0526)  Intake/Output from previous day: 02/03 0701 - 02/04 0700 In: 840 [P.O.:840] Out: -   Labs: No results for input(s): WBC, RBC, HCT, PLT in the last 72 hours. No results for input(s): NA, K, CL, CO2, BUN, CREATININE, GLUCOSE, CALCIUM in the last 72 hours. No results for input(s): LABPT, INR in the last 72 hours.  Physical Exam: Neurologically intact ABD soft Intact pulses distally Incision: dressing C/D/I Compartment soft  Assessment/Plan: Patient stable  xrays satisfactory Continue mobilization with physical therapy Continue care  Advance diet Up with therapy  Plan on d/c top home  Venita Lick, MD [facility] [phone]

## 2015-08-09 NOTE — Progress Notes (Signed)
Pt is being discharged home. Discharge instructions were given to patient and family. Discharge instructions included but not limited to  Sign and symptoms of infection, when to call the doctor and follow up appoitment were discussed with the patient. 

## 2015-08-09 NOTE — Progress Notes (Signed)
    Subjective: Procedure(s) (LRB): TRANSFORAMINAL LUMBAR INTERBODY FUSION (TLIF) WITH PEDICLE SCREW FIXATION L4 - L5  1 LEVEL (N/A) 3 Days Post-Op  Patient reports pain as 3 on 0-10 scale.  Reports decreased leg pain reports incisional back pain   Positive void Positive bowel movement Positive flatus Negative chest pain or shortness of breath  Objective: Vital signs in last 24 hours: Temp:  [98 F (36.7 C)-98.8 F (37.1 C)] 98.8 F (37.1 C) (02/04 0526) Pulse Rate:  [73-105] 73 (02/04 0526) Resp:  [20] 20 (02/04 0526) BP: (108-137)/(64-73) 108/65 mmHg (02/04 0526) SpO2:  [94 %-96 %] 96 % (02/04 0526)  Intake/Output from previous day: 02/03 0701 - 02/04 0700 In: 840 [P.O.:840] Out: -   Labs: No results for input(s): WBC, RBC, HCT, PLT in the last 72 hours. No results for input(s): NA, K, CL, CO2, BUN, CREATININE, GLUCOSE, CALCIUM in the last 72 hours. No results for input(s): LABPT, INR in the last 72 hours.  Physical Exam: Neurologically intact ABD soft Intact pulses distally Incision: dressing C/D/I and no drainage Compartment soft  Assessment/Plan: Patient stable  xrays satisfactory Continue mobilization with physical therapy Continue care  Advance diet Up with therapy Discharge to SNF    Venita Lick, MD [facility] [phone]

## 2015-08-17 NOTE — Discharge Summary (Signed)
Patient ID: Jordan Mcconnell MRN: [id number] DOB/AGE: August 02, 1955 60 y.o.  Admit date: 08/06/2015 Discharge date: 08/17/2015  Admission Diagnoses:  Active Problems:   Back pain   Discharge Diagnoses:  Active Problems:   Back pain  status post Procedure(s): TRANSFORAMINAL LUMBAR INTERBODY FUSION (TLIF) WITH PEDICLE SCREW FIXATION L4 - L5  1 LEVEL  Past Medical History  Diagnosis Date  . HTN (hypertension)   . Obesity   . OSA (obstructive sleep apnea)     uses CPAP  . GERD (gastroesophageal reflux disease)   . Complication of anesthesia     one surgery pt. states he was hard to wake up    Surgeries: Procedure(s): TRANSFORAMINAL LUMBAR INTERBODY FUSION (TLIF) WITH PEDICLE SCREW FIXATION L4 - L5  1 LEVEL on 08/06/2015   Consultants:    Discharged Condition: Improved  Hospital Course: Jordan Mcconnell is an 60 y.o. male who was admitted 08/06/2015 for operative treatment of LBP. Patient failed conservative treatments (please see the history and physical for the specifics) and had severe unremitting pain that affects sleep, daily activities and work/hobbies. After pre-op clearance, the patient was taken to the operating room on 08/06/2015 and underwent  Procedure(s): TRANSFORAMINAL LUMBAR INTERBODY FUSION (TLIF) WITH PEDICLE SCREW FIXATION L4 - L5  1 LEVEL.  Discharged the pt from hospital on 08/09/15.  Patient was given perioperative antibiotics:  Anti-infectives    Start     Dose/Rate Route Frequency Ordered Stop   08/07/15 0000  vancomycin (VANCOCIN) 1,500 mg in sodium chloride 0.9 % 500 mL IVPB     1,500 mg 250 mL/hr over 120 Minutes Intravenous  Once 08/06/15 1830 08/07/15 0300   08/06/15 1145  vancomycin (VANCOCIN) 1,500 mg in sodium chloride 0.9 % 500 mL IVPB     1,500 mg 250 mL/hr over 120 Minutes Intravenous To Surgery 08/05/15 1311 08/06/15 1230       Patient was given sequential compression devices and early ambulation to prevent DVT.   Patient benefited maximally  from hospital stay and there were no complications. At the time of discharge, the patient was urinating/moving their bowels without difficulty, tolerating a regular diet, pain is controlled with oral pain medications and they have been cleared by PT/OT.   Recent vital signs: No data found.    Recent laboratory studies: No results for input(s): WBC, HGB, HCT, PLT, NA, K, CL, CO2, BUN, CREATININE, GLUCOSE, INR, CALCIUM in the last 72 hours.  Invalid input(s): PT, 2   Discharge Medications:     Medication List    STOP taking these medications        acetaminophen 500 MG tablet  Commonly known as:  TYLENOL     HYDROcodone-acetaminophen 5-325 MG tablet  Commonly known as:  NORCO/VICODIN     traMADol 50 MG tablet  Commonly known as:  ULTRAM      TAKE these medications        dexlansoprazole 60 MG capsule  Commonly known as:  DEXILANT  Take 60 mg by mouth daily. Reported on 06/23/2015     hydrochlorothiazide 25 MG tablet  Commonly known as:  HYDRODIURIL  TAKE 1 TABLET BY MOUTH EVERY DAY     methocarbamol 500 MG tablet  Commonly known as:  ROBAXIN  Take 1 tablet (500 mg total) by mouth 3 (three) times daily as needed for muscle spasms.     ondansetron 4 MG tablet  Commonly known as:  ZOFRAN  Take 1 tablet (4 mg total) by mouth every  8 (eight) hours as needed for nausea or vomiting.     oxyCODONE-acetaminophen 10-325 MG tablet  Commonly known as:  PERCOCET  Take 1 tablet by mouth every 4 (four) hours as needed for pain.        Diagnostic Studies: Dg Lumbar Spine 2-3 Views  08/07/2015  CLINICAL DATA:  Postop pain. EXAM: LUMBAR SPINE - 2-3 VIEW COMPARISON:  08/06/2015. FINDINGS: L4-L5 posterior and interbody fusion. Stable anatomic alignment. Hardware intact. No acute bony abnormality. Diffuse degenerative change lumbar spine. IMPRESSION: Stable exam with L4-L5 posterior and interbody fusion. No acute abnormality. Hardware intact. Electronically Signed   By: Maisie Fus  Register    On: 08/07/2015 07:48   Dg Lumbar Spine 2-3 Views  08/06/2015  CLINICAL DATA:  60 year old male status post right L4-L5 transforaminal lumbar interbody fusion (TLIF). EXAM: LUMBAR SPINE - 2-3 VIEW; DG C-ARM 61-120 MIN COMPARISON:  No priors. FINDINGS: 2 intraoperative fluoroscopic spot views of the lumbar spine are submitted for evaluation, which appear to demonstrate postoperative changes of TLIF at L4-L5, with an interbody cage at the L4-L5 interspace. Alignment appears anatomic on this limited two view examination. IMPRESSION: 1. Postoperative changes of TLIF at L4-L5, as above. Electronically Signed   By: Trudie Reed M.D.   On: 08/06/2015 16:48   Dg C-arm 61-120 Min  08/06/2015  CLINICAL DATA:  60 year old male status post right L4-L5 transforaminal lumbar interbody fusion (TLIF). EXAM: LUMBAR SPINE - 2-3 VIEW; DG C-ARM 61-120 MIN COMPARISON:  No priors. FINDINGS: 2 intraoperative fluoroscopic spot views of the lumbar spine are submitted for evaluation, which appear to demonstrate postoperative changes of TLIF at L4-L5, with an interbody cage at the L4-L5 interspace. Alignment appears anatomic on this limited two view examination. IMPRESSION: 1. Postoperative changes of TLIF at L4-L5, as above. Electronically Signed   By: Trudie Reed M.D.   On: 08/06/2015 16:48          Follow-up Information    Follow up with Venita Lick D, MD. Schedule an appointment as soon as possible for a visit in 2 weeks.   Specialty:  Orthopedic Surgery   Why:  If symptoms worsen, For suture removal, For wound re-check   Contact information:   [address] [place] [address] [phone]       Discharge Plan:  discharge to   Disposition:     Signed: MayoBaxter Mcconnell for Dr. Venita Lick [facility] [phone] 08/17/2015, 8:10 PM

## 2015-10-13 ENCOUNTER — Telehealth: Payer: Self-pay | Admitting: Cardiology

## 2015-10-13 NOTE — Telephone Encounter (Signed)
New Message  Pt called states that the State on [place] checking on sleep disorder. Dr. Mayford Knifeurner prescribed sleep apnea products and high blood pressure medication. Pt req a call because he has paperwork that will nee to be filled out by the provider.

## 2015-10-13 NOTE — Telephone Encounter (Signed)
Patient bringing by the paperwork to drop off for Dr. Mayford Knifeurner to fill out/sign.

## 2015-12-11 ENCOUNTER — Encounter: Payer: Self-pay | Admitting: Internal Medicine

## 2016-01-26 ENCOUNTER — Encounter: Payer: Self-pay | Admitting: Cardiology

## 2016-02-12 ENCOUNTER — Encounter: Payer: Self-pay | Admitting: Cardiology

## 2016-02-12 ENCOUNTER — Ambulatory Visit (INDEPENDENT_AMBULATORY_CARE_PROVIDER_SITE_OTHER): Payer: PRIVATE HEALTH INSURANCE | Admitting: Cardiology

## 2016-02-12 ENCOUNTER — Encounter (INDEPENDENT_AMBULATORY_CARE_PROVIDER_SITE_OTHER): Payer: Self-pay

## 2016-02-12 VITALS — BP 128/82 | HR 64 | Ht 71.0 in | Wt 282.6 lb

## 2016-02-12 DIAGNOSIS — I10 Essential (primary) hypertension: Secondary | ICD-10-CM | POA: Diagnosis not present

## 2016-02-12 DIAGNOSIS — G4733 Obstructive sleep apnea (adult) (pediatric): Secondary | ICD-10-CM

## 2016-02-12 DIAGNOSIS — E669 Obesity, unspecified: Secondary | ICD-10-CM

## 2016-02-12 NOTE — Progress Notes (Signed)
Cardiology Office Note    Date:  02/12/2016   ID:  Jordan Mcconnell, DOB 10-24-1955, MRN [id number]  PCP:  Lenora Boys, MD  Cardiologist:  Armanda Magic, MD   Chief Complaint  Patient presents with  . Sleep Apnea  . Hypertension    History of Present Illness:  Jordan Mcconnell is a 59 y.o. male with a history of OSA, HTN and obesity who presents today for followup. He tolerates his CPAP well. He tolerates his nasal pillow mask and feels his pressure is adequate.He sometimes will get some mild mouth dryness but not very bothersome.  He denies any nasal congestion.  He feels rested in the am sometimes depending on if he has slept well. He occasionally will get sleepy during the day but has not had any problems with sleepiness while driving.  He has not been exercising due to back surgery earlier this year and now having numbness in his thighs.  He is going to start water aerobics.   Past Medical History:  Diagnosis Date  . Complication of anesthesia    one surgery pt. states he was hard to wake up  . GERD (gastroesophageal reflux disease)   . HTN (hypertension)   . Obesity   . OSA (obstructive sleep apnea)    uses CPAP    Past Surgical History:  Procedure Laterality Date  . COLONOSCOPY    . ESOPHAGEAL DILATION     1 or 2 times  . ESOPHAGOGASTRODUODENOSCOPY N/A 10/30/2013   Procedure: ESOPHAGOGASTRODUODENOSCOPY (EGD);  Surgeon: Charolett Bumpers, MD;  Location: [facility];  Service: Endoscopy;  Laterality: N/A;  . JOINT REPLACEMENT     bilateral knees  . SHOULDER ARTHROSCOPY     Bilateral 3 times each shoulder    Current Medications: Outpatient Medications Prior to Visit  Medication Sig Dispense Refill  . dexlansoprazole (DEXILANT) 60 MG capsule Take 60 mg by mouth daily. Reported on 06/23/2015    . hydrochlorothiazide (HYDRODIURIL) 25 MG tablet TAKE 1 TABLET BY MOUTH EVERY DAY (Patient taking differently: Take 25 mg by mouth daily. TAKE 1 TABLET BY MOUTH EVERY DAY)  90 tablet 2  . methocarbamol (ROBAXIN) 500 MG tablet Take 1 tablet (500 mg total) by mouth 3 (three) times daily as needed for muscle spasms. 60 tablet 0  . ondansetron (ZOFRAN) 4 MG tablet Take 1 tablet (4 mg total) by mouth every 8 (eight) hours as needed for nausea or vomiting. 20 tablet 0  . oxyCODONE-acetaminophen (PERCOCET) 10-325 MG tablet Take 1 tablet by mouth every 4 (four) hours as needed for pain. 60 tablet 0   No facility-administered medications prior to visit.      Allergies:   Penicillins; Sulfa antibiotics; Codeine; and Tramadol   Social History   Social History  . Marital status: Married    Spouse name: N/A  . Number of children: N/A  . Years of education: N/A   Social History Main Topics  . Smoking status: Never Smoker  . Smokeless tobacco: Never Used  . Alcohol use No  . Drug use: No  . Sexual activity: Not Asked   Other Topics Concern  . None   Social History Narrative  . None     Family History:  The patient's family history includes Colon cancer in his father.   ROS:   Please see the history of present illness.    ROS All other systems reviewed and are negative.   PHYSICAL EXAM:   VS:  BP 128/82  Pulse 64   Ht 5\' 11"  (1.803 m)   Wt 282 lb 9.6 oz (128.2 kg)   BMI 39.41 kg/m    GEN: Well nourished, well developed, in no acute distress  HEENT: normal  Neck: no JVD, carotid bruits, or masses Cardiac: RRR; no murmurs, rubs, or gallops,no edema.  Intact distal pulses bilaterally.  Respiratory:  clear to auscultation bilaterally, normal work of breathing GI: soft, nontender, nondistended, + BS MS: no deformity or atrophy  Skin: warm and dry, no rash Neuro:  Alert and Oriented x 3, Strength and sensation are intact Psych: euthymic mood, full affect  Wt Readings from Last 3 Encounters:  02/12/16 282 lb 9.6 oz (128.2 kg)  08/06/15 285 lb 14.4 oz (129.7 kg)  08/01/15 285 lb 14.4 oz (129.7 kg)      Studies/Labs Reviewed:   EKG:  EKG is not  ordered today.    Recent Labs: 08/01/2015: BUN 22; Creatinine, Ser 0.89; Hemoglobin 15.6; Platelets 195; Potassium 4.6; Sodium 141   Lipid Panel No results found for: CHOL, TRIG, HDL, CHOLHDL, VLDL, LDLCALC, LDLDIRECT  Additional studies/ records that were reviewed today include:  none    ASSESSMENT:    1. OSA (obstructive sleep apnea)   2. Essential hypertension   3. Obesity      PLAN:  In order of problems listed above:  OSA - the patient is tolerating PAP therapy well without any problems. The PAP download was reviewed today and showed an AHI of 4.4/hr on 12 cm H2O with 96% compliance in using more than 4 hours nightly.  The patient has been using and benefiting from CPAP use and will continue to benefit from therapy.  HTN - BP controlled on current medical regimen.  Continue HCTZ 3.   Obesity - I have encouraged him to get into a routine exercise program and cut back on carbs and portions.     Medication Adjustments/Labs and Tests Ordered: Current medicines are reviewed at length with the patient today.  Concerns regarding medicines are outlined above.  Medication changes, Labs and Tests ordered today are listed in the Patient Instructions below.  There are no Patient Instructions on file for this visit.   Signed, Armanda Magicraci Ayinde Swim, MD  02/12/2016 8:18 AM    [facility] [address], [place], [place]  [address] Phone: [phone]; Fax: [phone]

## 2016-02-12 NOTE — Patient Instructions (Signed)

## 2016-02-13 ENCOUNTER — Encounter: Payer: Self-pay | Admitting: Cardiology

## 2016-02-19 ENCOUNTER — Ambulatory Visit (INDEPENDENT_AMBULATORY_CARE_PROVIDER_SITE_OTHER): Payer: PRIVATE HEALTH INSURANCE | Admitting: Internal Medicine

## 2016-02-19 ENCOUNTER — Encounter: Payer: Self-pay | Admitting: Internal Medicine

## 2016-02-19 VITALS — BP 130/78 | HR 87 | Ht 71.0 in | Wt 280.0 lb

## 2016-02-19 DIAGNOSIS — K529 Noninfective gastroenteritis and colitis, unspecified: Secondary | ICD-10-CM | POA: Diagnosis not present

## 2016-02-19 DIAGNOSIS — Z800 Family history of malignant neoplasm of digestive organs: Secondary | ICD-10-CM | POA: Diagnosis not present

## 2016-02-19 NOTE — Progress Notes (Signed)
CC: Diarrhea after eating Subjective:    Patient ID: Jordan Mcconnell, male    DOB: 1956/06/01, 60 y.o.   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 Expresses that he has urgent BM described as watery with no blood. Associates mild lower abdominal aching pain with BMs and gurgling gas in abdomen. Eating make some episodes of diarrhea come on. Sxs occur at home and in public not effecting sleep. Denies any new medications or alcohol use. Has no nausea, vomiting, weight loss, loss in appetite, or fever. State in had a colonoscopy in his 2530's because his father had colon cancer at 6460, but no colonoscopy since.   Allergies  Allergen Reactions  . Penicillins Rash    Has patient had a PCN reaction causing immediate rash, facial/tongue/throat swelling, SOB or lightheadedness with hypotension: Yes Has patient had a PCN reaction causing severe rash involving mucus membranes or skin necrosis: No Has patient had a PCN reaction that required hospitalization Yes Has patient had a PCN reaction occurring within the last 10 years: No If all of the above answers are "NO", then may proceed with Cephalosporin use.   . Sulfa Antibiotics Rash  . Codeine Nausea Only  . Tramadol Nausea Only    Severe dizziness   Outpatient Medications Prior to Visit  Medication Sig Dispense Refill  . dexlansoprazole (DEXILANT) 60 MG capsule Take 60 mg by mouth daily. Reported on 06/23/2015    . hydrochlorothiazide (HYDRODIURIL) 25 MG tablet TAKE 1 TABLET BY MOUTH EVERY DAY (Patient taking differently: Take 25 mg by mouth daily. TAKE 1 TABLET BY MOUTH EVERY DAY) 90 tablet 2   No facility-administered medications prior to visit.    Past Medical History:  Diagnosis Date  . Complication of anesthesia    one surgery pt. states he was hard to wake up  . GERD (gastroesophageal reflux disease)   . HTN  (hypertension)   . Obesity   . OSA (obstructive sleep apnea)    uses CPAP   Past Surgical History:  Procedure Laterality Date  . COLONOSCOPY    . ESOPHAGEAL DILATION     1 or 2 times  . ESOPHAGOGASTRODUODENOSCOPY N/A 10/30/2013   Procedure: ESOPHAGOGASTRODUODENOSCOPY (EGD);  Surgeon: Charolett BumpersMartin K Johnson, MD;  [facility];  Service: Endoscopy;  Laterality: N/A;  . JOINT REPLACEMENT     bilateral knees  . SHOULDER ARTHROSCOPY     Bilateral 3 times each shoulder   Social History   Social History  . Marital status: Married    Spouse name: N/A  . Number of children: N/A  . Years of education: N/A   Social History Main Topics  . Smoking status: Never Smoker  . Smokeless tobacco: Never Used  . Alcohol use No  . Drug use: No  . Sexual activity: Not Asked   Other Topics Concern  . None   Social History Narrative  . None   Family History  Problem Relation Age of Onset  . Colon cancer Father    Review of Systems See HPI, all other symptoms negative    Objective:   Physical Exam  @BP  130/78   Pulse 87   Ht 5\' 11"  (1.803 m)   Wt 280 lb (127 kg)   BMI 39.05 kg/m @  General:  Well-developed, well-nourished and in no acute distress Eyes:  anicteric. ENT:   Mouth  and posterior pharynx free of lesions.  Neck:   supple w/o thyromegaly or mass.  Lungs: Clear to auscultation bilaterally. Heart:   S1S2, no rubs, murmurs, gallops. Abdomen:  soft, non-tender, no hepatosplenomegaly, hernia, or mass and BS+.  Rectal: Deferred Lymph:  no cervical or supraclavicular adenopathy. Extremities:   no edema, cyanosis or clubbing Skin   no rash. Neuro:  A&O x 3.  Psych:  appropriate mood and  Affect.   Data Reviewed: Labs from 08/01/15 Office Note on 02/12/16     Assessment & Plan:   Encounter Diagnoses  Name Primary?  . Chronic diarrhea Yes  . Family history of colon cancer    Patient presents with chronic diarrhea that appears to be present for 10 years. Cancer  is less likely as he has no weight loss, hematochezia, and has been present for 10 years. Will schedule for colonoscopy for screening purposes as he is 60 and to differentiate diarrhea as this could be IBS or microscopic colitis. Will plan to take colon biopsies to evaluate. The risks and benefits as well as alternatives of endoscopic procedure(s) have been discussed and reviewed. All questions answered. The patient agrees to proceed.   Debbra RidingJason Calie Buttrey PA-S   I have seen the patient with Jordan Mcconnell and he has served as a Neurosurgeonscribe. Iva Booparl E. Gessner, MD, Clementeen GrahamFACG

## 2016-02-19 NOTE — Patient Instructions (Signed)

## 2016-02-21 ENCOUNTER — Encounter: Payer: Self-pay | Admitting: Internal Medicine

## 2016-02-23 ENCOUNTER — Encounter: Payer: Self-pay | Admitting: Internal Medicine

## 2016-02-27 ENCOUNTER — Ambulatory Visit (AMBULATORY_SURGERY_CENTER): Payer: PRIVATE HEALTH INSURANCE | Admitting: Internal Medicine

## 2016-02-27 ENCOUNTER — Encounter: Payer: Self-pay | Admitting: Internal Medicine

## 2016-02-27 VITALS — BP 118/66 | HR 68 | Temp 97.1°F | Resp 13 | Ht 71.0 in | Wt 280.0 lb

## 2016-02-27 DIAGNOSIS — K6389 Other specified diseases of intestine: Secondary | ICD-10-CM | POA: Diagnosis not present

## 2016-02-27 DIAGNOSIS — K529 Noninfective gastroenteritis and colitis, unspecified: Secondary | ICD-10-CM

## 2016-02-27 DIAGNOSIS — K639 Disease of intestine, unspecified: Secondary | ICD-10-CM

## 2016-02-27 MED ORDER — SODIUM CHLORIDE 0.9 % IV SOLN: 500.0000 mL | INTRAVENOUS | Status: DC

## 2016-02-27 NOTE — Progress Notes (Signed)
Called to room to assist during endoscopic procedure.  Patient ID and intended procedure confirmed with present staff. Received instructions for my participation in the procedure from the performing physician.  

## 2016-02-27 NOTE — Op Note (Signed)
[facility] Patient Name: Jordan Mcconnell Procedure Date: 02/27/2016 11:32 AM MRN: [id number] Endoscopist: Iva Boop , MD Age: 60 Referring MD:  Date of Birth: [birth year] Gender: Male Account #: [id number] Procedure:                Colonoscopy Indications:              Clinically significant diarrhea of unexplained                            origin, Family history of colon cancer in a                            first-degree relative Medicines:                Propofol per Anesthesia, Monitored Anesthesia Care Procedure:                Pre-Anesthesia Assessment:                           - Prior to the procedure, a History and Physical                            was performed, and patient medications and                            allergies were reviewed. The patient's tolerance of                            previous anesthesia was also reviewed. The risks                            and benefits of the procedure and the sedation                            options and risks were discussed with the patient.                            All questions were answered, and informed consent                            was obtained. Prior Anticoagulants: The patient has                            taken no previous anticoagulant or antiplatelet                            agents. ASA Grade Assessment: III - A patient with                            severe systemic disease. After reviewing the risks                            and benefits, the patient was deemed in  satisfactory condition to undergo the procedure.                           After obtaining informed consent, the colonoscope                            was passed under direct vision. Throughout the                            procedure, the patient's blood pressure, pulse, and                            oxygen saturations were monitored continuously. The                            Model CF-HQ190L  [id number]) scope was introduced                            through the anus and advanced to the the terminal                            ileum, with identification of the appendiceal                            orifice and IC valve. The terminal ileum, the                            ileocecal valve and the appendiceal orifice were                            photographed. The colonoscopy was performed without                            difficulty. The quality of the bowel preparation                            was good. The bowel preparation used was Miralax. Scope In: 11:43:30 AM Scope Out: 11:54:49 AM Scope Withdrawal Time: 0 hours 10 minutes 11 seconds  Total Procedure Duration: 0 hours 11 minutes 19 seconds  Findings:                 The perianal and digital rectal examinations were                            normal. Pertinent negatives include normal prostate                            (size, shape, and consistency).                           A diffuse area of mucosa in the terminal ileum was                            nodular. Biopsies were taken with a cold forceps  for histology. Verification of patient                            identification for the specimen was done. Estimated                            blood loss was minimal.                           Diverticula were found in the sigmoid colon.                           The exam was otherwise without abnormality on                            direct and retroflexion views.                           Biopsies for histology were taken with a cold                            forceps from the entire colon for evaluation of                            microscopic colitis. Complications:            No immediate complications. Estimated blood loss:                            Minimal. Estimated Blood Loss:     Estimated blood loss was minimal. Impression:               - Nodular ileal mucosa. Biopsied.                            - Mild diverticulosis in the sigmoid colon.                           - The examination was otherwise normal on direct                            and retroflexion views.                           - Biopsies were taken with a cold forceps from the                            entire colon for evaluation of microscopic colitis. Recommendation:           - Patient has a contact number available for                            emergencies. The signs and symptoms of potential                            delayed complications were discussed with the  patient. Return to normal activities tomorrow.                            Written discharge instructions were provided to the                            patient.                           - Resume previous diet.                           - Continue present medications.                           - Repeat colonoscopy in 5 years for screening                            purposes.                           - Imodium 1 tablet PO QID PRN. Iva Boop, MD 02/27/2016 12:02:33 PM This report has been signed electronically.

## 2016-02-27 NOTE — Progress Notes (Signed)
To pacu vss patent aw reprotr to rn

## 2016-02-27 NOTE — Patient Instructions (Addendum)
I did not see anything obvious that would explain the diarhea but took biopsies to see what that tells [name]. I hope to have information for you next week.  It is ok to use imodium AD to see if that helps.  Your next routine colonoscopy should be in 5 years - 2022. This is due to family history of colon cancer.  I suggest you see if Dr. Lezlie Octave at [facility] in [place] is taking new patients.  I appreciate the opportunity to care for you. Iva Boop, MD, FACG  YOU HAD AN ENDOSCOPIC PROCEDURE TODAY AT THE [facility]:   Refer to the procedure report that was given to you for any specific questions about what was found during the examination.  If the procedure report does not answer your questions, please call your gastroenterologist to clarify.  If you requested that your care partner not be given the details of your procedure findings, then the procedure report has been included in a sealed envelope for you to review at your convenience later.  YOU SHOULD EXPECT: Some feelings of bloating in the abdomen. Passage of more gas than usual.  Walking can help get rid of the air that was put into your GI tract during the procedure and reduce the bloating. If you had a lower endoscopy (such as a colonoscopy or flexible sigmoidoscopy) you may notice spotting of blood in your stool or on the toilet paper. If you underwent a bowel prep for your procedure, you may not have a normal bowel movement for a few days.  Please Note:  You might notice some irritation and congestion in your nose or some drainage.  This is from the oxygen used during your procedure.  There is no need for concern and it should clear up in a day or so.  SYMPTOMS TO REPORT IMMEDIATELY:   Following lower endoscopy (colonoscopy or flexible sigmoidoscopy):  Excessive amounts of blood in the stool  Significant tenderness or worsening of abdominal pains  Swelling of the abdomen that is new,  acute  Fever of 100F or higher   For urgent or emergent issues, a gastroenterologist can be reached at any hour by calling [phone].   DIET:  We do recommend a small meal at first, but then you may proceed to your regular diet.  Drink plenty of fluids but you should avoid alcoholic beverages for 24 hours.  ACTIVITY:  You should plan to take it easy for the rest of today and you should NOT DRIVE or use heavy machinery until tomorrow (because of the sedation medicines used during the test).    FOLLOW UP: Our staff will call the number listed on your records the next business day following your procedure to check on you and address any questions or concerns that you may have regarding the information given to you following your procedure. If we do not reach you, we will leave a message.  However, if you are feeling well and you are not experiencing any problems, there is no need to return our call.  We will assume that you have returned to your regular daily activities without incident.  If any biopsies were taken you will be contacted by phone or by letter within the next 1-3 weeks.  Please call us at [phone] if you have not heard about the biopsies in 3 weeks.    SIGNATURES/CONFIDENTIALITY: You and/or your care partner have signed paperwork which will be entered into your  electronic medical record.  These signatures attest to the fact that that the information above on your After Visit Summary has been reviewed and is understood.  Full responsibility of the confidentiality of this discharge information lies with you and/or your care-partner.  Await biopsy results.

## 2016-03-01 ENCOUNTER — Telehealth: Payer: Self-pay | Admitting: *Deleted

## 2016-03-01 NOTE — Telephone Encounter (Signed)
  Follow up Call-  Call back number 02/27/2016  Post procedure Call Back phone  # [phone]  Permission to leave phone message Yes  Some recent data might be hidden     Patient questions:  Do you have a fever, pain , or abdominal swelling? No. Pain Score  0 *  Have you tolerated food without any problems? Yes.    Have you been able to return to your normal activities? Yes.    Do you have any questions about your discharge instructions: Diet   No. Medications  No. Follow up visit  No.  Do you have questions or concerns about your Care? No.  Actions: * If pain score is 4 or above: No action needed, pain <4.

## 2016-03-05 ENCOUNTER — Other Ambulatory Visit: Payer: Self-pay

## 2016-03-05 MED ORDER — ONDANSETRON HCL 4 MG PO TABS: 4.0000 mg | ORAL_TABLET | Freq: Two times a day (BID) | ORAL | 3 refills | Status: DC

## 2016-03-05 NOTE — Progress Notes (Signed)
Please call from office - no colitis  Seems like IBS  Did Imodium help?  If not try ondansetron 4 mg qac # 90 1 RF and arrange REV next available   [facility]

## 2016-05-12 ENCOUNTER — Ambulatory Visit (INDEPENDENT_AMBULATORY_CARE_PROVIDER_SITE_OTHER): Payer: PRIVATE HEALTH INSURANCE | Admitting: Internal Medicine

## 2016-05-12 ENCOUNTER — Encounter: Payer: Self-pay | Admitting: Internal Medicine

## 2016-05-12 ENCOUNTER — Encounter (INDEPENDENT_AMBULATORY_CARE_PROVIDER_SITE_OTHER): Payer: Self-pay

## 2016-05-12 ENCOUNTER — Other Ambulatory Visit (INDEPENDENT_AMBULATORY_CARE_PROVIDER_SITE_OTHER): Payer: PRIVATE HEALTH INSURANCE

## 2016-05-12 VITALS — BP 134/80 | HR 60 | Ht 68.5 in | Wt 287.0 lb

## 2016-05-12 DIAGNOSIS — K219 Gastro-esophageal reflux disease without esophagitis: Secondary | ICD-10-CM | POA: Diagnosis not present

## 2016-05-12 DIAGNOSIS — K529 Noninfective gastroenteritis and colitis, unspecified: Secondary | ICD-10-CM

## 2016-05-12 DIAGNOSIS — K580 Irritable bowel syndrome with diarrhea: Secondary | ICD-10-CM | POA: Diagnosis not present

## 2016-05-12 HISTORY — DX: Irritable bowel syndrome with diarrhea: K58.0

## 2016-05-12 LAB — IGA: IgA: 269 mg/dL (ref 68–378)

## 2016-05-12 NOTE — Progress Notes (Signed)
   Jordan GasmanKenneth W Cothern 60 y.o. 09-10-1955 [id number]  Assessment & Plan:   1. Chronic diarrhea   2. Irritable bowel syndrome with diarrhea   3. Gastroesophageal reflux disease, esophagitis presence not specified    It seems like his problem is most likely IBS which is improved but not resolved on ondansetron. His wife thinks it might be gluten, it is reasonable to screen for celiac disease so we'll do that with TTG antibody and IgA level. I did discuss the possibility of viberzi therapy, but we are both concerned about the rare but real possible risk of pancreatitis. Will not use that. Other options are breath testing, Xifaxan treatment investigation for sucrase deficiency though seems very unlikely.  He does seem better on the ondansetron, having had formed stools for the first time in a long time. However situational stress will cause aggravation. I have advised him to use the ondansetron more than twice a day on those days.  I will contact him with the celiac screening results and further plans after that. If those are negative would probably continue with the ondansetron and see me in a year. Sooner if needed.  Subjective:   Chief Complaint: f/u diarrhea  HPI He is better overall having formed stools for the first time in years after starting ondansetron 4 mg twice a day empirically for IBS D. His colonoscopy with random biopsies was negative. His wife thinks he might have gluten problems. He reviews his history in that he has had distention and explosive watery diarrhea after eating with urgent defecation. That is basically gone except for periods of stress which she is having right now. He asks if Dexilant wouldn't interact with the ondansetron. He was placed on the  nocturnal regurgitation when he was on the CPAP machine. He use to cover a pillow with secretions at night and says that is markedly better since being on the dexilant. Diarrhea symptoms clearly predated the PPI  treatment. Medications, allergies, past medical history, past surgical history, family history and social history are reviewed and updated in the EMR.  Review of Systems As per history of present illness  Objective:   Physical Exam BP 134/80 (BP Location: Left Arm, Patient Position: Sitting, Cuff Size: Large)   Pulse 60   Ht 5' 8.5" (1.74 m) Comment: height measured without shoes  Wt 287 lb (130.2 kg)   BMI 43.00 kg/m  Obese Eyes anicteric Appropriate mood and affect  15 minutes time spent with patient > half in counseling coordination of care

## 2016-05-12 NOTE — Patient Instructions (Signed)
  Your physician has requested that you go to the basement for the following lab work before leaving today: TTG, IGA    I appreciate the opportunity to care for you. Stan Headarl Gessner, MD, [facility]

## 2016-05-13 LAB — TISSUE TRANSGLUTAMINASE, IGA: TISSUE TRANSGLUTAMINASE AB, IGA: 1 U/mL (ref <4)

## 2016-05-14 NOTE — Progress Notes (Signed)
Does not have celiac disease (gluten allergy)  I suggest he continue with the ondansetron  Has had an increase in sxs due to stress we think  If he finds life not tolerable will consider other testing/Tx

## 2016-05-18 ENCOUNTER — Telehealth: Payer: Self-pay | Admitting: Internal Medicine

## 2016-05-18 NOTE — Telephone Encounter (Signed)
See result note.  

## 2016-07-03 ENCOUNTER — Other Ambulatory Visit: Payer: Self-pay | Admitting: Cardiology

## 2016-07-07 ENCOUNTER — Ambulatory Visit (INDEPENDENT_AMBULATORY_CARE_PROVIDER_SITE_OTHER): Payer: PRIVATE HEALTH INSURANCE | Admitting: Family Medicine

## 2016-07-07 ENCOUNTER — Encounter: Payer: Self-pay | Admitting: Family Medicine

## 2016-07-07 VITALS — BP 130/78 | HR 90 | Temp 98.1°F | Resp 16 | Ht 69.0 in | Wt 285.5 lb

## 2016-07-07 DIAGNOSIS — R053 Chronic cough: Secondary | ICD-10-CM

## 2016-07-07 DIAGNOSIS — R05 Cough: Secondary | ICD-10-CM

## 2016-07-07 DIAGNOSIS — Z23 Encounter for immunization: Secondary | ICD-10-CM | POA: Diagnosis not present

## 2016-07-07 DIAGNOSIS — I10 Essential (primary) hypertension: Secondary | ICD-10-CM | POA: Diagnosis not present

## 2016-07-07 LAB — HEPATIC FUNCTION PANEL
ALBUMIN: 4.5 g/dL (ref 3.5–5.2)
ALT: 46 U/L (ref 0–53)
AST: 36 U/L (ref 0–37)
Alkaline Phosphatase: 102 U/L (ref 39–117)
BILIRUBIN DIRECT: 0.1 mg/dL (ref 0.0–0.3)
TOTAL PROTEIN: 7.6 g/dL (ref 6.0–8.3)
Total Bilirubin: 0.4 mg/dL (ref 0.2–1.2)

## 2016-07-07 LAB — BASIC METABOLIC PANEL
BUN: 17 mg/dL (ref 6–23)
CALCIUM: 9.8 mg/dL (ref 8.4–10.5)
CO2: 31 meq/L (ref 19–32)
CREATININE: 0.85 mg/dL (ref 0.40–1.50)
Chloride: 96 mEq/L (ref 96–112)
GFR: 97.75 mL/min (ref >60.00)
GLUCOSE: 190 mg/dL — AB (ref 70–99)
Potassium: 5.4 mEq/L — ABNORMAL HIGH (ref 3.5–5.1)
SODIUM: 136 meq/L (ref 135–145)

## 2016-07-07 LAB — LIPID PANEL
CHOLESTEROL: 174 mg/dL (ref 0–200)
HDL: 34.9 mg/dL — AB (ref >39.00)
NonHDL: 138.71
TRIGLYCERIDES: 216 mg/dL — AB (ref 0.0–149.0)
Total CHOL/HDL Ratio: 5
VLDL: 43.2 mg/dL — AB (ref 0.0–40.0)

## 2016-07-07 LAB — CBC WITH DIFFERENTIAL/PLATELET
BASOS PCT: 0.7 % (ref 0.0–3.0)
Basophils Absolute: 0.1 10*3/uL (ref 0.0–0.1)
EOS PCT: 2.2 % (ref 0.0–5.0)
Eosinophils Absolute: 0.2 10*3/uL (ref 0.0–0.7)
HCT: 49.2 % (ref 39.0–52.0)
Hemoglobin: 16.8 g/dL (ref 13.0–17.0)
LYMPHS ABS: 3.2 10*3/uL (ref 0.7–4.0)
Lymphocytes Relative: 30.6 % (ref 12.0–46.0)
MCHC: 34.1 g/dL (ref 30.0–36.0)
MCV: 85.5 fl (ref 78.0–100.0)
MONO ABS: 0.7 10*3/uL (ref 0.1–1.0)
MONOS PCT: 6.4 % (ref 3.0–12.0)
NEUTROS PCT: 60.1 % (ref 43.0–77.0)
Neutro Abs: 6.3 10*3/uL (ref 1.4–7.7)
Platelets: 206 10*3/uL (ref 150.0–400.0)
RBC: 5.76 Mil/uL (ref 4.22–5.81)
RDW: 13.6 % (ref 11.5–15.5)
WBC: 10.4 10*3/uL (ref 4.0–10.5)

## 2016-07-07 LAB — HEMOGLOBIN A1C: Hgb A1c MFr Bld: 8.1 % — ABNORMAL HIGH (ref 4.6–6.5)

## 2016-07-07 LAB — LDL CHOLESTEROL, DIRECT: LDL DIRECT: 107 mg/dL

## 2016-07-07 LAB — TSH: TSH: 1.89 u[IU]/mL (ref 0.35–4.50)

## 2016-07-07 MED ORDER — RANITIDINE HCL 300 MG PO TABS: 300.0000 mg | ORAL_TABLET | Freq: Every day | ORAL | 6 refills | Status: DC

## 2016-07-07 NOTE — Assessment & Plan Note (Signed)
New to provider, ongoing for pt.  Stressed need for healthy diet and regular exercise.  Check labs to risk stratify.  Will follow. 

## 2016-07-07 NOTE — Assessment & Plan Note (Signed)
New to provider, ongoing for pt.  Adequate control today.  No anticipated med changes.  Check labs.  Will follow.

## 2016-07-07 NOTE — Progress Notes (Signed)
   Subjective:    Patient ID: Jordan Mcconnell, male    DOB: 1955/10/01, 61 y.o.   MRN: [id number]  HPI New to establish.  Previous MD- [facility]  HTN- chronic problem, on HCTZ daily w/ adequate control.  Denies CP, SOB except when coughing, HAs, visual changes.    Morbid obesity- pt's BMI is 42.  Has OSA- seeing Dr Mayford Knifeurner and using CPAP nightly.  Not exercising, not able to exert himself due to cough, back pain/knee pain.  Chronic cough- pt reports he will have cough w/ bending forward 'doing work' or w/ minimal exertion, 'almost to the point of throwing up'.  Cough is painful.  Pt reports cough is not seasonal.  sxs are intermittent, starting a few months ago.  'it's almost like I'm sucking in not enough air'.  Cough is not productive.  Pt reports reflux is intermittently controlled.   Review of Systems For ROS see HPI     Objective:   Physical Exam  Constitutional: He is oriented to person, place, and time. He appears well-developed and well-nourished. No distress.  obese  HENT:  Head: Normocephalic and atraumatic.  Eyes: Conjunctivae and EOM are normal. Pupils are equal, round, and reactive to light.  Neck: Normal range of motion. Neck supple. No thyromegaly present.  Cardiovascular: Normal rate, regular rhythm, normal heart sounds and intact distal pulses.   No murmur heard. Pulmonary/Chest: Effort normal and breath sounds normal. No respiratory distress.  Dry cough  Abdominal: Soft. Bowel sounds are normal. He exhibits no distension.  Musculoskeletal: He exhibits no edema.  Lymphadenopathy:    He has no cervical adenopathy.  Neurological: He is alert and oriented to person, place, and time. No cranial nerve deficit.  Skin: Skin is warm and dry.  Psychiatric: He has a normal mood and affect. His behavior is normal.  Vitals reviewed.         Assessment & Plan:

## 2016-07-07 NOTE — Patient Instructions (Signed)
Follow up in 2-3 weeks to recheck cough We'll notify you of your lab results and make any changes if needed Continue the Dexilant daily Add the Ranitidine daily to help control the acid production Try and make healthy food choices and get some exercise Call with any questions or concerns Welcome!  We're glad to have you! Happy New Year!!!

## 2016-07-07 NOTE — Assessment & Plan Note (Signed)
New to provider, ongoing for pt.  Suspect this is due to undertreated GERD.  Start H2 blocker in addition to PPI.  If no improvement, will need referral to pulmonary for complete evaluation.  Pt expressed understanding and is in agreement w/ plan.

## 2016-07-07 NOTE — Progress Notes (Signed)
Pre visit review using our clinic review tool, if applicable. No additional management support is needed unless otherwise documented below in the visit note. 

## 2016-07-08 ENCOUNTER — Telehealth: Payer: Self-pay | Admitting: Family Medicine

## 2016-07-08 ENCOUNTER — Other Ambulatory Visit: Payer: Self-pay | Admitting: General Practice

## 2016-07-08 DIAGNOSIS — E1165 Type 2 diabetes mellitus with hyperglycemia: Principal | ICD-10-CM

## 2016-07-08 DIAGNOSIS — IMO0001 Reserved for inherently not codable concepts without codable children: Secondary | ICD-10-CM

## 2016-07-08 MED ORDER — METFORMIN HCL 500 MG PO TABS: 500.0000 mg | ORAL_TABLET | Freq: Two times a day (BID) | ORAL | 6 refills | Status: DC

## 2016-07-08 NOTE — Telephone Encounter (Signed)
Called and spoke with pt and informed that we can wait on the diabetes counseling until he comes back in 3 weeks when we recheck his Bp we can discuss his diabetes.

## 2016-07-08 NOTE — Telephone Encounter (Signed)
Pt has a few questions for Jess, [facility] and would like a call back. Pt states that this can wait until tomorrow if needed.

## 2016-07-17 ENCOUNTER — Other Ambulatory Visit: Payer: Self-pay | Admitting: Cardiology

## 2016-07-28 ENCOUNTER — Ambulatory Visit: Payer: PRIVATE HEALTH INSURANCE | Admitting: Family Medicine

## 2016-07-29 ENCOUNTER — Encounter: Payer: Self-pay | Admitting: Family Medicine

## 2016-07-29 ENCOUNTER — Ambulatory Visit (INDEPENDENT_AMBULATORY_CARE_PROVIDER_SITE_OTHER): Payer: PRIVATE HEALTH INSURANCE | Admitting: Family Medicine

## 2016-07-29 DIAGNOSIS — E119 Type 2 diabetes mellitus without complications: Secondary | ICD-10-CM | POA: Diagnosis not present

## 2016-07-29 LAB — BASIC METABOLIC PANEL
BUN: 19 mg/dL (ref 6–23)
CALCIUM: 9.9 mg/dL (ref 8.4–10.5)
CO2: 31 mEq/L (ref 19–32)
CREATININE: 0.91 mg/dL (ref 0.40–1.50)
Chloride: 99 mEq/L (ref 96–112)
GFR: 90.33 mL/min (ref >60.00)
Glucose, Bld: 101 mg/dL — ABNORMAL HIGH (ref 70–99)
Potassium: 4.1 mEq/L (ref 3.5–5.1)
SODIUM: 138 meq/L (ref 135–145)

## 2016-07-29 LAB — MICROALBUMIN / CREATININE URINE RATIO
CREATININE, U: 69.3 mg/dL
MICROALB/CREAT RATIO: 1 mg/g (ref 0.0–30.0)

## 2016-07-29 NOTE — Assessment & Plan Note (Signed)
New dx at last visit.  Pt's A1C was 8.1  He reports this dx 'scared me to death' and he has been very proactive in learning as much as he can about the disease state.  Has lost 13 lbs in 3 weeks!  Is completely changing his diet and attempting to get more exercise.  Will get microalbumin today.  Recheck BMP due to addition of Metformin.  Discussed low carb diets- informational materials given.  Applauded his efforts.  Will follow.

## 2016-07-29 NOTE — Patient Instructions (Signed)
Follow up in 3 months to recheck diabetes We'll notify you of your lab results and make any changes if needed Keep up the good work on healthy diet and regular exercise- you look great!!! Call with any questions or concerns HAPPY EARLY BIRTHDAY!!!

## 2016-07-29 NOTE — Progress Notes (Signed)
   Subjective:    Patient ID: Jordan Mcconnell, male    DOB: [birth year], 61 y.o.   MRN: [id number]  HPI DM- new dx at last visit.  Pt was started on Metformin.  Has lost 13 lbs in 3 weeks with dietary changes.  Went to diabetic education and did not have the best experience so he googled what he could do at home.  No CP, SOB, HAs, visual changes, abd pain, N/V, numbness/tingling of hands.   Review of Systems For ROS see HPI     Objective:   Physical Exam  Constitutional: He is oriented to person, place, and time. He appears well-developed and well-nourished. No distress.  HENT:  Head: Normocephalic and atraumatic.  Eyes: Conjunctivae and EOM are normal. Pupils are equal, round, and reactive to light.  Neck: Normal range of motion. Neck supple. No thyromegaly present.  Cardiovascular: Normal rate, regular rhythm, normal heart sounds and intact distal pulses.   No murmur heard. Pulmonary/Chest: Effort normal and breath sounds normal. No respiratory distress.  Abdominal: Soft. Bowel sounds are normal. He exhibits no distension.  Musculoskeletal: He exhibits no edema.  Lymphadenopathy:    He has no cervical adenopathy.  Neurological: He is alert and oriented to person, place, and time. No cranial nerve deficit.  Skin: Skin is warm and dry.  Psychiatric: He has a normal mood and affect. His behavior is normal.  Vitals reviewed.         Assessment & Plan:

## 2016-07-29 NOTE — Progress Notes (Signed)
Pre visit review using our clinic review tool, if applicable. No additional management support is needed unless otherwise documented below in the visit note. 

## 2016-07-30 ENCOUNTER — Encounter: Payer: Self-pay | Admitting: General Practice

## 2016-08-17 ENCOUNTER — Other Ambulatory Visit: Payer: Self-pay | Admitting: General Practice

## 2016-08-17 ENCOUNTER — Telehealth: Payer: Self-pay | Admitting: Family Medicine

## 2016-08-17 MED ORDER — DEXLANSOPRAZOLE 60 MG PO CPDR: 60.0000 mg | DELAYED_RELEASE_CAPSULE | Freq: Every day | ORAL | 1 refills | Status: DC

## 2016-08-17 NOTE — Telephone Encounter (Signed)
Called and informed pt, dexilant was refilled.

## 2016-08-17 NOTE — Telephone Encounter (Signed)
Wants to know if he should still take Dexilant-dr for stomach.   Thanks,  -LL

## 2016-08-17 NOTE — Telephone Encounter (Signed)
Yes- continue the Dexilant

## 2016-08-24 ENCOUNTER — Encounter: Payer: PRIVATE HEALTH INSURANCE | Attending: Family Medicine | Admitting: *Deleted

## 2016-08-24 DIAGNOSIS — Z713 Dietary counseling and surveillance: Secondary | ICD-10-CM | POA: Insufficient documentation

## 2016-08-24 DIAGNOSIS — E119 Type 2 diabetes mellitus without complications: Secondary | ICD-10-CM

## 2016-08-24 DIAGNOSIS — E1165 Type 2 diabetes mellitus with hyperglycemia: Secondary | ICD-10-CM | POA: Diagnosis not present

## 2016-08-24 NOTE — Progress Notes (Signed)

## 2016-08-31 ENCOUNTER — Encounter: Payer: PRIVATE HEALTH INSURANCE | Admitting: *Deleted

## 2016-08-31 DIAGNOSIS — E119 Type 2 diabetes mellitus without complications: Secondary | ICD-10-CM

## 2016-08-31 DIAGNOSIS — Z713 Dietary counseling and surveillance: Secondary | ICD-10-CM | POA: Diagnosis not present

## 2016-08-31 NOTE — Progress Notes (Signed)
Patient was seen on 08/31/2016 for the second of a series of three diabetes self-management courses at the [facility]. The following learning objectives were met by the patient during this class:   Describe the role of different macronutrients on glucose  Explain how carbohydrates affect blood glucose  State what foods contain the most carbohydrates  Demonstrate carbohydrate counting  Demonstrate how to read Nutrition Facts food label  Describe effects of various fats on heart health  Describe the importance of good nutrition for health and healthy eating strategies  Describe techniques for managing your shopping, cooking and meal planning  List strategies to follow meal plan when dining out  Describe the effects of alcohol on glucose and how to use it safely  Goals:  Follow Diabetes Meal Plan as instructed  Eat 3 meals and 2 snacks, every 3-5 hrs  Aim for carbohydrate intake of 60 grams carbohydrate/meal Aim for carbohydrate intake of 30 grams carbohydrate/snack Add lean protein foods to meals/snacks  Monitor glucose levels as instructed by your doctor   Follow-Up Plan:  Attend Core 3  Work towards following your personal food plan.

## 2016-09-07 ENCOUNTER — Encounter: Payer: PRIVATE HEALTH INSURANCE | Attending: Family Medicine | Admitting: *Deleted

## 2016-09-07 DIAGNOSIS — Z713 Dietary counseling and surveillance: Secondary | ICD-10-CM | POA: Insufficient documentation

## 2016-09-07 DIAGNOSIS — E1165 Type 2 diabetes mellitus with hyperglycemia: Secondary | ICD-10-CM | POA: Insufficient documentation

## 2016-09-07 DIAGNOSIS — E119 Type 2 diabetes mellitus without complications: Secondary | ICD-10-CM

## 2016-09-07 NOTE — Progress Notes (Signed)
Patient was seen on 09/07/2016 for the third of a series of three diabetes self-management courses at the [facility].   Janene Madeira. State the amount of activity recommended for healthy living . Describe activities suitable for individual needs . Identify ways to regularly incorporate activity into daily life . Identify barriers to activity and ways to over come these barriers  Identify diabetes medications being personally used and their primary action for lowering glucose and possible side effects . Describe role of stress on blood glucose and develop strategies to address psychosocial issues . Identify diabetes complications and ways to prevent them  Explain how to manage diabetes during illness . Evaluate success in meeting personal goal . Establish 2-3 goals that they will plan to diligently work on until they return for the  4545-month follow-up visit  Goals:   I will be active   I will eat less unhealthy fats   Your patient has identified these potential barriers to change:  None stated  Your patient has identified their diabetes self-care support plan as  Family Support Plan:  Attend Support Group as desired

## 2016-09-24 ENCOUNTER — Other Ambulatory Visit: Payer: Self-pay | Admitting: Internal Medicine

## 2016-09-24 NOTE — Telephone Encounter (Signed)
Refill x 12 

## 2016-09-24 NOTE — Telephone Encounter (Signed)
Please advise Sir, thank you. 

## 2016-11-05 ENCOUNTER — Encounter: Payer: Self-pay | Admitting: Family Medicine

## 2016-11-05 ENCOUNTER — Ambulatory Visit (INDEPENDENT_AMBULATORY_CARE_PROVIDER_SITE_OTHER): Payer: PRIVATE HEALTH INSURANCE | Admitting: Family Medicine

## 2016-11-05 VITALS — BP 120/81 | HR 79 | Temp 98.0°F | Resp 16 | Ht 71.0 in | Wt 247.4 lb

## 2016-11-05 DIAGNOSIS — K219 Gastro-esophageal reflux disease without esophagitis: Secondary | ICD-10-CM

## 2016-11-05 DIAGNOSIS — E119 Type 2 diabetes mellitus without complications: Secondary | ICD-10-CM

## 2016-11-05 LAB — BASIC METABOLIC PANEL
BUN: 19 mg/dL (ref 6–23)
CALCIUM: 9.7 mg/dL (ref 8.4–10.5)
CHLORIDE: 100 meq/L (ref 96–112)
CO2: 29 meq/L (ref 19–32)
Creatinine, Ser: 0.83 mg/dL (ref 0.40–1.50)
GFR: 100.36 mL/min (ref >60.00)
Glucose, Bld: 94 mg/dL (ref 70–99)
Potassium: 4.1 mEq/L (ref 3.5–5.1)
Sodium: 138 mEq/L (ref 135–145)

## 2016-11-05 LAB — HEMOGLOBIN A1C: HEMOGLOBIN A1C: 6.2 % (ref 4.6–6.5)

## 2016-11-05 NOTE — Progress Notes (Signed)
Pre visit review using our clinic review tool, if applicable. No additional management support is needed unless otherwise documented below in the visit note. 

## 2016-11-05 NOTE — Assessment & Plan Note (Signed)
Pt has lost nearly 30 lbs since dx!  Applauded his efforts.  Foot exam done today.  UTD on microalbumin.  Due for eye exam- pt to schedule.  Tolerating metformin w/o difficulty.  Check labs.  Adjust meds prn

## 2016-11-05 NOTE — Progress Notes (Signed)
   Subjective:    Patient ID: Jordan Mcconnell, male    DOB: [birth year], 61 y.o.   MRN: [id number]  HPI DM- chronic problem, on Metformin BID.  UTD on microalbumin.  Due for foot and eye exam.  Has lost another 16 lbs- 30 overall!  Pt reports he typically feels good.  Denies symptomatic lows.  No CP, SOB, HAs, visual changes, abd pain, N/V, numbness/tingling of hands/feet.  GERD- chronic problem.  Pt is interested in stopping 1 of the medications if possible.  Reports sxs are much better controlled than previous due to his dietary changes.   Review of Systems For ROS see HPI     Objective:   Physical Exam  Constitutional: He is oriented to person, place, and time. He appears well-developed and well-nourished. No distress.  HENT:  Head: Normocephalic and atraumatic.  Eyes: Conjunctivae and EOM are normal. Pupils are equal, round, and reactive to light.  Neck: Normal range of motion. Neck supple. No thyromegaly present.  Cardiovascular: Normal rate, regular rhythm, normal heart sounds and intact distal pulses.   No murmur heard. Pulmonary/Chest: Effort normal and breath sounds normal. No respiratory distress.  Abdominal: Soft. Bowel sounds are normal. He exhibits no distension.  Musculoskeletal: He exhibits no edema.  Lymphadenopathy:    He has no cervical adenopathy.  Neurological: He is alert and oriented to person, place, and time. No cranial nerve deficit.  Skin: Skin is warm and dry.  Psychiatric: He has a normal mood and affect. His behavior is normal.  Vitals reviewed.         Assessment & Plan:

## 2016-11-05 NOTE — Assessment & Plan Note (Signed)
Improved w/ diet and lifestyle changes.  Will hold Ranitidine nightly and continue Dexilant.  Applauded his efforts.  Will follow.

## 2016-11-05 NOTE — Patient Instructions (Signed)
Follow up in 3-4 months to recheck sugar and cholesterol We'll notify you of your lab results and make any changes if needed Call and schedule your eye exam Hold the Ranitidine nightly but continue the Dexilant daily for stomach acid Call with any questions or concerns Keep up the good work!  I'm so proud of you!!!

## 2016-11-08 ENCOUNTER — Encounter: Payer: Self-pay | Admitting: General Practice

## 2016-11-20 LAB — HM DIABETES EYE EXAM

## 2016-11-23 ENCOUNTER — Encounter: Payer: Self-pay | Admitting: General Practice

## 2017-01-23 ENCOUNTER — Other Ambulatory Visit: Payer: Self-pay | Admitting: Family Medicine

## 2017-03-01 ENCOUNTER — Encounter (INDEPENDENT_AMBULATORY_CARE_PROVIDER_SITE_OTHER): Payer: Self-pay

## 2017-03-01 ENCOUNTER — Encounter: Payer: Self-pay | Admitting: Cardiology

## 2017-03-01 ENCOUNTER — Ambulatory Visit (INDEPENDENT_AMBULATORY_CARE_PROVIDER_SITE_OTHER): Payer: PRIVATE HEALTH INSURANCE | Admitting: Cardiology

## 2017-03-01 VITALS — BP 122/76 | HR 78 | Ht 71.0 in | Wt 240.0 lb

## 2017-03-01 DIAGNOSIS — I10 Essential (primary) hypertension: Secondary | ICD-10-CM

## 2017-03-01 DIAGNOSIS — G4733 Obstructive sleep apnea (adult) (pediatric): Secondary | ICD-10-CM | POA: Diagnosis not present

## 2017-03-01 NOTE — Patient Instructions (Signed)

## 2017-03-01 NOTE — Progress Notes (Signed)
Cardiology Office Note:    Date:  03/01/2017   ID:  Jordan Mcconnell, DOB December 13, 1955, MRN [id number]  PCP:  Sheliah Hatch, MD  Cardiologist:  Armanda Magic, MD   Referring MD: Sheliah Hatch, MD   Chief Complaint  Patient presents with  . Sleep Apnea  . Hypertension    History of Present Illness:    Jordan Mcconnell is a 61 y.o. male with a hx of of OSA, HTN and obesity who presents today for followup. He is doing well with his CPAP. He has no problems with his nasal pillow mask but actually had to go back to his full face mask because his dog ate his nasal mask.  He continues to feel that the pressure is adequate.He has been under a lot of stress as his wife was just dx with Lupus and he is tending to his inlaws farm. He sleeps soundly at night.  He goes to sleep at 10pm and gets up at 4:30 to 5am.  He feels tired in the am due to lack of sleep. He had back surgery and now his back pain has resolved but he was diagnosed with DM.  He says that right now he does not have time for exercise due to taking care of his wife.   Past Medical History:  Diagnosis Date  . Complication of anesthesia    one surgery pt. states he was hard to wake up  . GERD (gastroesophageal reflux disease)   . HTN (hypertension)   . 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 Obesity   . OSA (obstructive sleep apnea)    uses CPAP    Past Surgical History:  Procedure Laterality Date  . BACK SURGERY  08/2015  . COLONOSCOPY    . ESOPHAGEAL DILATION     1 or 2 times  . ESOPHAGOGASTRODUODENOSCOPY N/A 10/30/2013   Procedure: ESOPHAGOGASTRODUODENOSCOPY (EGD);  Surgeon: Charolett Bumpers, MD;  Location: [facility];  Service: Endoscopy;  Laterality: N/A;  . JOINT REPLACEMENT     bilateral knees  . SHOULDER ARTHROSCOPY     Bilateral 3 times each shoulder    Current Medications: Current Meds  Medication Sig  . DEXILANT 60 MG capsule TAKE 1 CAPSULE (60 MG TOTAL) BY MOUTH DAILY.  .  hydrochlorothiazide (HYDRODIURIL) 25 MG tablet TAKE 1 TABLET BY MOUTH EVERY DAY *INS ALLOWS 30 DAYS  . metFORMIN (GLUCOPHAGE) 500 MG tablet TAKE 1 TABLET BY MOUTH TWICE A DAY WITH FOOD  . ondansetron (ZOFRAN) 4 MG tablet TAKE 1 TABLET BY MOUTH TWICE A DAY BEFORE A MEAL  . ranitidine (ZANTAC) 300 MG tablet TAKE 1 TABLET BY MOUTH AT BEDTIME     Allergies:   Penicillins; Sulfa antibiotics; Codeine; and Tramadol   Social History   Social History  . Marital status: Married    Spouse name: N/A  . Number of children: N/A  . Years of education: N/A   Social History Main Topics  . Smoking status: Never Smoker  . Smokeless tobacco: Never Used  . Alcohol use No  . Drug use: No  . Sexual activity: Not Asked   Other Topics Concern  . None   Social History Narrative  . None     Family History: The patient's family history includes Cancer in his father and maternal grandfather; Colon cancer in his father; Dementia in his father; Heart murmur in his mother; Memory loss in his maternal grandmother and mother; Stroke in his father.  ROS:  Please see the history of present illness.     All other systems reviewed and are negative.  EKGs/Labs/Other Studies Reviewed:    The following studies were reviewed today: CPAP download  EKG:  EKG is not ordered today.    Recent Labs: 07/07/2016: ALT 46; Hemoglobin 16.8; Platelets 206.0; TSH 1.89 11/05/2016: BUN 19; Creatinine, Ser 0.83; Potassium 4.1; Sodium 138   Recent Lipid Panel    Component Value Date/Time   CHOL 174 07/07/2016 1415   TRIG 216.0 (H) 07/07/2016 1415   HDL 34.90 (L) 07/07/2016 1415   CHOLHDL 5 07/07/2016 1415   VLDL 43.2 (H) 07/07/2016 1415   LDLDIRECT 107.0 07/07/2016 1415    Physical Exam:    VS:  BP 122/76   Pulse 78   Ht 5\' 11"  (1.803 m)   Wt 240 lb (108.9 kg)   SpO2 97%   BMI 33.47 kg/m     Wt Readings from Last 3 Encounters:  03/01/17 240 lb (108.9 kg)  11/05/16 247 lb 6 oz (112.2 kg)  08/24/16 262 lb  9.6 oz (119.1 kg)     GEN:  Well nourished, well developed in no acute distress HEENT: Normal NECK: No JVD; No carotid bruits LYMPHATICS: No lymphadenopathy CARDIAC: RRR, no murmurs, rubs, gallops RESPIRATORY:  Clear to auscultation without rales, wheezing or rhonchi  ABDOMEN: Soft, non-tender, non-distended MUSCULOSKELETAL:  No edema; No deformity  SKIN: Warm and dry NEUROLOGIC:  Alert and oriented x 3 PSYCHIATRIC:  Normal affect   ASSESSMENT:    1. OSA (obstructive sleep apnea)   2. Essential hypertension   3. Morbid obesity (HCC)    PLAN:    In order of problems listed above:  OSA - the patient is tolerating PAP therapy well without any problems. The PAP download was reviewed today and showed an AHI of 3.9/hr on 12 cm H2O with 100% compliance in using more than 4 hours nightly.  The patient has been using and benefiting from CPAP use and will continue to benefit from therapy.  HTN - BP is well controlled on exam today.  He will continue on HCTZ 25mg  daily. Renal function stable.  3.   Obesity - I have encouraged him to get into a routine exercise program and cut back on carbs and portions. He has lost 33lbs since January 2018 with diet.    Medication Adjustments/Labs and Tests Ordered: Current medicines are reviewed at length with the patient today.  Concerns regarding medicines are outlined above.  No orders of the defined types were placed in this encounter.  No orders of the defined types were placed in this encounter.   Signed, Armanda Magic, MD  03/01/2017 1:49 PM    [facility]

## 2017-03-17 ENCOUNTER — Ambulatory Visit (INDEPENDENT_AMBULATORY_CARE_PROVIDER_SITE_OTHER): Payer: PRIVATE HEALTH INSURANCE | Admitting: Family Medicine

## 2017-03-17 ENCOUNTER — Encounter: Payer: Self-pay | Admitting: Family Medicine

## 2017-03-17 VITALS — BP 122/82 | HR 66 | Temp 98.1°F | Resp 16 | Ht 71.0 in | Wt 242.0 lb

## 2017-03-17 DIAGNOSIS — I10 Essential (primary) hypertension: Secondary | ICD-10-CM

## 2017-03-17 DIAGNOSIS — F4320 Adjustment disorder, unspecified: Secondary | ICD-10-CM | POA: Insufficient documentation

## 2017-03-17 DIAGNOSIS — E119 Type 2 diabetes mellitus without complications: Secondary | ICD-10-CM

## 2017-03-17 DIAGNOSIS — F4323 Adjustment disorder with mixed anxiety and depressed mood: Secondary | ICD-10-CM | POA: Diagnosis not present

## 2017-03-17 LAB — BASIC METABOLIC PANEL
BUN: 18 mg/dL (ref 6–23)
CALCIUM: 9.8 mg/dL (ref 8.4–10.5)
CO2: 30 meq/L (ref 19–32)
CREATININE: 0.81 mg/dL (ref 0.40–1.50)
Chloride: 98 mEq/L (ref 96–112)
GFR: 103.1 mL/min (ref >60.00)
Glucose, Bld: 90 mg/dL (ref 70–99)
Potassium: 4.6 mEq/L (ref 3.5–5.1)
Sodium: 137 mEq/L (ref 135–145)

## 2017-03-17 LAB — CBC WITH DIFFERENTIAL/PLATELET
BASOS ABS: 0 10*3/uL (ref 0.0–0.1)
Basophils Relative: 0.6 % (ref 0.0–3.0)
Eosinophils Absolute: 0.3 10*3/uL (ref 0.0–0.7)
Eosinophils Relative: 3.4 % (ref 0.0–5.0)
HCT: 46.9 % (ref 39.0–52.0)
Hemoglobin: 15.5 g/dL (ref 13.0–17.0)
LYMPHS ABS: 2.6 10*3/uL (ref 0.7–4.0)
Lymphocytes Relative: 34.3 % (ref 12.0–46.0)
MCHC: 33 g/dL (ref 30.0–36.0)
MCV: 87.5 fl (ref 78.0–100.0)
MONO ABS: 0.4 10*3/uL (ref 0.1–1.0)
MONOS PCT: 5.5 % (ref 3.0–12.0)
NEUTROS ABS: 4.3 10*3/uL (ref 1.4–7.7)
NEUTROS PCT: 56.2 % (ref 43.0–77.0)
PLATELETS: 225 10*3/uL (ref 150.0–400.0)
RBC: 5.36 Mil/uL (ref 4.22–5.81)
RDW: 14.4 % (ref 11.5–15.5)
WBC: 7.7 10*3/uL (ref 4.0–10.5)

## 2017-03-17 LAB — LIPID PANEL
Cholesterol: 133 mg/dL (ref 0–200)
HDL: 45.8 mg/dL (ref >39.00)
LDL Cholesterol: 75 mg/dL (ref 0–99)
NONHDL: 86.98
Total CHOL/HDL Ratio: 3
Triglycerides: 62 mg/dL (ref 0.0–149.0)
VLDL: 12.4 mg/dL (ref 0.0–40.0)

## 2017-03-17 LAB — HEPATIC FUNCTION PANEL
ALK PHOS: 68 U/L (ref 39–117)
ALT: 28 U/L (ref 0–53)
AST: 17 U/L (ref 0–37)
Albumin: 4.5 g/dL (ref 3.5–5.2)
Bilirubin, Direct: 0.1 mg/dL (ref 0.0–0.3)
Total Bilirubin: 0.5 mg/dL (ref 0.2–1.2)
Total Protein: 7.2 g/dL (ref 6.0–8.3)

## 2017-03-17 LAB — HEMOGLOBIN A1C: HEMOGLOBIN A1C: 5.9 % (ref 4.6–6.5)

## 2017-03-17 LAB — TSH: TSH: 1.21 u[IU]/mL (ref 0.35–4.50)

## 2017-03-17 NOTE — Assessment & Plan Note (Signed)
Chronic problem.  Currently well controlled.  Asymptomatic.  Check labs.  No anticipated med changes.  Will follow. 

## 2017-03-17 NOTE — Progress Notes (Signed)
   Subjective:    Patient ID: Jordan Mcconnell, male    DOB: [birth year], 61 y.o.   MRN: [id number]  HPI DM- chronic problem, on Metformin 500mg  BID.  UTD on microalbumin, foot exam, eye exam.  Denies symptomatic lows.  No numbness/tingling of hands/feet.  No abd pain, N/V/D.  HTN- chronic problem, on HCTZ daily w/ adequate control.  Denies CP, SOB, HAs, visual changes, edema.  Stress- pt is very worried about his wife and her recent dx of Lupus.  She has not been feeling well recently and is leaving her job due to health issues.  Pt is not interested in medication at this time.   Review of Systems For ROS see HPI     Objective:   Physical Exam  Constitutional: He is oriented to person, place, and time. He appears well-developed and well-nourished. No distress.  HENT:  Head: Normocephalic and atraumatic.  Eyes: Pupils are equal, round, and reactive to light. Conjunctivae and EOM are normal.  Neck: Normal range of motion. Neck supple. No thyromegaly present.  Cardiovascular: Normal rate, regular rhythm, normal heart sounds and intact distal pulses.   No murmur heard. Pulmonary/Chest: Effort normal and breath sounds normal. No respiratory distress.  Abdominal: Soft. Bowel sounds are normal. He exhibits no distension.  Musculoskeletal: He exhibits no edema.  Lymphadenopathy:    He has no cervical adenopathy.  Neurological: He is alert and oriented to person, place, and time. No cranial nerve deficit.  Skin: Skin is warm and dry.  Psychiatric: He has a normal mood and affect. His behavior is normal.  Vitals reviewed.         Assessment & Plan:

## 2017-03-17 NOTE — Assessment & Plan Note (Signed)
New.  Pt is really struggling with his wife's lupus dx.  Not interested in medication or counseling.  Will follow closely

## 2017-03-17 NOTE — Patient Instructions (Signed)
Schedule your complete physical in 3-4 months We'll notify you of your lab results and make any changes if needed Continue to work on healthy diet and regular exercise- you look great! Call with any questions or concerns Hang in there!! Stay safe!!!

## 2017-03-17 NOTE — Assessment & Plan Note (Signed)
Chronic problem.  Hx of good control.  UTD on foot exam, eye exam, microalbumin.  Stressed need for healthy diet and regular exercise.  Check labs.  Adjust meds prn

## 2017-03-17 NOTE — Progress Notes (Signed)
Pre visit review using our clinic review tool, if applicable. No additional management support is needed unless otherwise documented below in the visit note. 

## 2017-03-21 ENCOUNTER — Encounter: Payer: Self-pay | Admitting: General Practice

## 2017-05-05 ENCOUNTER — Other Ambulatory Visit: Payer: Self-pay | Admitting: Family Medicine

## 2017-06-03 ENCOUNTER — Other Ambulatory Visit: Payer: Self-pay

## 2017-06-03 ENCOUNTER — Other Ambulatory Visit: Payer: Self-pay | Admitting: General Practice

## 2017-06-03 MED ORDER — HYDROCHLOROTHIAZIDE 25 MG PO TABS: ORAL_TABLET | ORAL | 0 refills | Status: DC

## 2017-06-03 MED ORDER — RANITIDINE HCL 300 MG PO TABS: 300.0000 mg | ORAL_TABLET | Freq: Every day | ORAL | 1 refills | Status: DC

## 2017-06-07 MED ORDER — HYDROCHLOROTHIAZIDE 25 MG PO TABS: ORAL_TABLET | ORAL | 8 refills | Status: DC

## 2017-06-07 NOTE — Addendum Note (Signed)
Addended by: Demetrios LollBARNARD, CATHY C on: 06/07/2017 09:09 AM   Modules accepted: Orders

## 2017-06-29 ENCOUNTER — Other Ambulatory Visit: Payer: Self-pay | Admitting: Emergency Medicine

## 2017-06-29 MED ORDER — DEXLANSOPRAZOLE 60 MG PO CPDR: DELAYED_RELEASE_CAPSULE | ORAL | 0 refills | Status: DC

## 2017-07-22 ENCOUNTER — Other Ambulatory Visit: Payer: Self-pay

## 2017-07-22 ENCOUNTER — Ambulatory Visit (INDEPENDENT_AMBULATORY_CARE_PROVIDER_SITE_OTHER): Payer: PRIVATE HEALTH INSURANCE | Admitting: Family Medicine

## 2017-07-22 ENCOUNTER — Encounter: Payer: Self-pay | Admitting: Family Medicine

## 2017-07-22 VITALS — BP 123/83 | HR 76 | Temp 98.7°F | Resp 16 | Ht 71.0 in | Wt 247.0 lb

## 2017-07-22 DIAGNOSIS — I10 Essential (primary) hypertension: Secondary | ICD-10-CM | POA: Diagnosis not present

## 2017-07-22 DIAGNOSIS — Z125 Encounter for screening for malignant neoplasm of prostate: Secondary | ICD-10-CM | POA: Diagnosis not present

## 2017-07-22 DIAGNOSIS — Z0000 Encounter for general adult medical examination without abnormal findings: Secondary | ICD-10-CM

## 2017-07-22 DIAGNOSIS — E119 Type 2 diabetes mellitus without complications: Secondary | ICD-10-CM

## 2017-07-22 LAB — MICROALBUMIN / CREATININE URINE RATIO
Creatinine,U: 219.9 mg/dL
MICROALB/CREAT RATIO: 0.6 mg/g (ref 0.0–30.0)
Microalb, Ur: 1.4 mg/dL (ref 0.0–1.9)

## 2017-07-22 LAB — CBC WITH DIFFERENTIAL/PLATELET
BASOS PCT: 0.5 % (ref 0.0–3.0)
Basophils Absolute: 0 10*3/uL (ref 0.0–0.1)
EOS PCT: 4.8 % (ref 0.0–5.0)
Eosinophils Absolute: 0.3 10*3/uL (ref 0.0–0.7)
HCT: 48.2 % (ref 39.0–52.0)
HEMOGLOBIN: 16.2 g/dL (ref 13.0–17.0)
LYMPHS ABS: 2.3 10*3/uL (ref 0.7–4.0)
Lymphocytes Relative: 35.3 % (ref 12.0–46.0)
MCHC: 33.5 g/dL (ref 30.0–36.0)
MCV: 86.5 fl (ref 78.0–100.0)
MONO ABS: 0.5 10*3/uL (ref 0.1–1.0)
Monocytes Relative: 7 % (ref 3.0–12.0)
NEUTROS PCT: 52.4 % (ref 43.0–77.0)
Neutro Abs: 3.4 10*3/uL (ref 1.4–7.7)
Platelets: 206 10*3/uL (ref 150.0–400.0)
RBC: 5.57 Mil/uL (ref 4.22–5.81)
RDW: 13.6 % (ref 11.5–15.5)
WBC: 6.5 10*3/uL (ref 4.0–10.5)

## 2017-07-22 LAB — HEPATIC FUNCTION PANEL
ALK PHOS: 58 U/L (ref 39–117)
ALT: 41 U/L (ref 0–53)
AST: 33 U/L (ref 0–37)
Albumin: 4.4 g/dL (ref 3.5–5.2)
BILIRUBIN DIRECT: 0.2 mg/dL (ref 0.0–0.3)
BILIRUBIN TOTAL: 0.8 mg/dL (ref 0.2–1.2)
Total Protein: 7 g/dL (ref 6.0–8.3)

## 2017-07-22 LAB — BASIC METABOLIC PANEL
BUN: 16 mg/dL (ref 6–23)
CHLORIDE: 97 meq/L (ref 96–112)
CO2: 35 meq/L — AB (ref 19–32)
CREATININE: 0.83 mg/dL (ref 0.40–1.50)
Calcium: 9.7 mg/dL (ref 8.4–10.5)
GFR: 100.12 mL/min (ref >60.00)
Glucose, Bld: 113 mg/dL — ABNORMAL HIGH (ref 70–99)
POTASSIUM: 4.3 meq/L (ref 3.5–5.1)
Sodium: 139 mEq/L (ref 135–145)

## 2017-07-22 LAB — LIPID PANEL
Cholesterol: 136 mg/dL (ref 0–200)
HDL: 37.4 mg/dL — AB (ref >39.00)
LDL Cholesterol: 77 mg/dL (ref 0–99)
NonHDL: 98.51
Total CHOL/HDL Ratio: 4
Triglycerides: 106 mg/dL (ref 0.0–149.0)
VLDL: 21.2 mg/dL (ref 0.0–40.0)

## 2017-07-22 LAB — HEMOGLOBIN A1C: HEMOGLOBIN A1C: 6 % (ref 4.6–6.5)

## 2017-07-22 LAB — TSH: TSH: 2.69 u[IU]/mL (ref 0.35–4.50)

## 2017-07-22 LAB — PSA: PSA: 1.29 ng/mL (ref 0.10–4.00)

## 2017-07-22 NOTE — Progress Notes (Signed)
   Subjective:    Patient ID: Jordan RisenKenneth W Resetar, male    DOB: April 25, 1956, 62 y.o.   MRN: [id number]  HPI CPE- UTD on colonoscopy, eye exam, foot exam.  Due for microalbumin.  Pt has gained 5 lbs since last visit.  UTD on flu and Tdap.   Review of Systems Patient reports no vision/hearing changes, anorexia, fever ,adenopathy, persistant/recurrent hoarseness, swallowing issues, chest pain, palpitations, edema, persistant/recurrent cough, hemoptysis, dyspnea (rest,exertional, paroxysmal nocturnal), gastrointestinal  bleeding (melena, rectal bleeding), abdominal pain, excessive heart burn, GU symptoms (dysuria, hematuria, voiding/incontinence issues) syncope, focal weakness, memory loss, numbness & tingling, skin/hair/nail changes, depression, anxiety, abnormal bruising/bleeding, musculoskeletal symptoms/signs.     Objective:   Physical Exam BP 123/83   Pulse 76   Temp 98.7 F (37.1 C) (Oral)   Resp 16   Ht 5\' 11"  (1.803 m)   Wt 247 lb (112 kg)   SpO2 98%   BMI 34.45 kg/m   General Appearance:    Alert, cooperative, no distress, appears stated age  Head:    Normocephalic, without obvious abnormality, atraumatic  Eyes:    PERRL, conjunctiva/corneas clear, EOM's intact, fundi    benign, both eyes       Ears:    Normal TM's and external ear canals, both ears  Nose:   Nares normal, septum midline, mucosa normal, no drainage   or sinus tenderness  Throat:   Lips, mucosa, and tongue normal; teeth and gums normal  Neck:   Supple, symmetrical, trachea midline, no adenopathy;       thyroid:  No enlargement/tenderness/nodules  Back:     Symmetric, no curvature, ROM normal, no CVA tenderness  Lungs:     Clear to auscultation bilaterally, respirations unlabored  Chest wall:    No tenderness or deformity  Heart:    Regular rate and rhythm, S1 and S2 normal, no murmur, rub   or gallop  Abdomen:     Soft, non-tender, bowel sounds active all four quadrants,    no masses, no organomegaly    Genitalia:    Normal male without lesion, discharge or tenderness  Rectal:    Normal tone, normal prostate, no masses or tenderness  Extremities:   Extremities normal, atraumatic, no cyanosis or edema  Pulses:   2+ and symmetric all extremities  Skin:   Skin color, texture, turgor normal, no rashes or lesions  Lymph nodes:   Cervical, supraclavicular, and axillary nodes normal  Neurologic:   CNII-XII intact. Normal strength, sensation and reflexes      throughout          Assessment & Plan:

## 2017-07-22 NOTE — Assessment & Plan Note (Signed)
Pt's PE WNL w/ exception of obesity.  UTD on colonoscopy, immunizations.  Check labs.  Anticipatory guidance provided.  

## 2017-07-22 NOTE — Assessment & Plan Note (Signed)
Chronic problem.  Tolerating Metformin w/o difficulty.  UTD on foot exam, eye exam.  Due for Microalbumin.  Stressed need for healthy diet and regular exercise.  Check labs.  Adjust meds prn

## 2017-07-22 NOTE — Assessment & Plan Note (Signed)
Chronic problem.  Well controlled on HCTZ daily.  Check labs.  No anticipated med changes.  Will follow.

## 2017-07-22 NOTE — Patient Instructions (Signed)
Follow up in 3-4 months to recheck diabetes We'll notify you of your lab results and make any changes if needed Continue to work on healthy diet and regular exercise- you can do it! STOP the Ranitidine and the Dexilant- restarting the Dexilant if sxs return Call with any questions or concerns Happy New Year!!!

## 2017-07-25 ENCOUNTER — Encounter: Payer: Self-pay | Admitting: General Practice

## 2017-09-29 ENCOUNTER — Other Ambulatory Visit: Payer: Self-pay | Admitting: Internal Medicine

## 2017-09-29 NOTE — Telephone Encounter (Signed)
OK to refill x 3   I had recommended he return to see me   If he is doing ok and wants PCP to refill can ask  Otherwise he will need to see me for more refills

## 2017-09-29 NOTE — Telephone Encounter (Signed)
May I refill , seen 05/2016 and takes this to help with his stools.

## 2017-09-29 NOTE — Telephone Encounter (Signed)
I spoke with Jordan Mcconnell and he is still having diarrhea episodes at times. He request for insurance I only send in a month's worth at the time of his medicine. He is going to contact his PCP and see if they could refill this or if they think he should come back here. His daughter is due with her first child and she lives in [place] so he is unsure about a time he could set up an appointment with either us or his PCP.

## 2017-10-18 ENCOUNTER — Other Ambulatory Visit: Payer: Self-pay

## 2017-10-18 ENCOUNTER — Ambulatory Visit: Payer: PRIVATE HEALTH INSURANCE | Admitting: Family Medicine

## 2017-10-18 ENCOUNTER — Encounter: Payer: Self-pay | Admitting: Family Medicine

## 2017-10-18 VITALS — BP 124/84 | HR 71 | Temp 98.1°F | Resp 16 | Ht 71.0 in | Wt 253.0 lb

## 2017-10-18 DIAGNOSIS — E119 Type 2 diabetes mellitus without complications: Secondary | ICD-10-CM | POA: Diagnosis not present

## 2017-10-18 LAB — BASIC METABOLIC PANEL
BUN: 15 mg/dL (ref 6–23)
CHLORIDE: 101 meq/L (ref 96–112)
CO2: 28 meq/L (ref 19–32)
CREATININE: 0.75 mg/dL (ref 0.40–1.50)
Calcium: 9.3 mg/dL (ref 8.4–10.5)
GFR: 112.45 mL/min (ref >60.00)
Glucose, Bld: 100 mg/dL — ABNORMAL HIGH (ref 70–99)
Potassium: 4.2 mEq/L (ref 3.5–5.1)
Sodium: 136 mEq/L (ref 135–145)

## 2017-10-18 LAB — TSH: TSH: 2.02 u[IU]/mL (ref 0.35–4.50)

## 2017-10-18 LAB — HEMOGLOBIN A1C: Hgb A1c MFr Bld: 6 % (ref 4.6–6.5)

## 2017-10-18 NOTE — Progress Notes (Signed)
   Subjective:    Patient ID: Jordan RisenKenneth W Bethards, male    DOB: 11/30/1955, 62 y.o.   MRN: [id number]  HPI DM- last A1C 6.0.  On Metformin 500mg  BID.  Due for eye exam, foot exam next month.  UTD on microalbumin.  Pt has gained 6 lbs since last visit.  'tired all the time'.  No CP, SOB, HAs, visual changes, edema.  No numbness/tingling of hands/feet.   Review of Systems For ROS see HPI     Objective:   Physical Exam  Constitutional: He is oriented to person, place, and time. He appears well-developed and well-nourished. No distress.  HENT:  Head: Normocephalic and atraumatic.  Eyes: Pupils are equal, round, and reactive to light. Conjunctivae and EOM are normal.  Neck: Normal range of motion. Neck supple. No thyromegaly present.  Cardiovascular: Normal rate, regular rhythm, normal heart sounds and intact distal pulses.  No murmur heard. Pulmonary/Chest: Effort normal and breath sounds normal. No respiratory distress.  Abdominal: Soft. Bowel sounds are normal. He exhibits no distension.  Musculoskeletal: He exhibits no edema.  Lymphadenopathy:    He has no cervical adenopathy.  Neurological: He is alert and oriented to person, place, and time. No cranial nerve deficit.  Skin: Skin is warm and dry.  Psychiatric: He has a normal mood and affect. His behavior is normal.  Vitals reviewed.         Assessment & Plan:

## 2017-10-18 NOTE — Assessment & Plan Note (Signed)
Ongoing issue for pt.  Last A1C was excellent!  Pt has gained 6 lbs since last visit but is under considerable stress.  Pt to schedule eye exam for next month.  UTD on microalbumin.  Foot exam done today.  Stressed need for healthy diet and regular exercise in hopes of coming off Metformin.  Check labs.  Adjust meds prn

## 2017-10-18 NOTE — Patient Instructions (Signed)
Follow up in 3-4 months to recheck diabetes and cholesterol We'll notify you of your lab results and make any changes if needed Continue to work on healthy diet and regular exercise- you can do it!!! Call and schedule your eye exam for next month if not already done Call with any questions or concerns- or if you need a refill on the Ondansetron Happy Easter!!!

## 2017-10-19 ENCOUNTER — Encounter: Payer: Self-pay | Admitting: General Practice

## 2017-12-06 ENCOUNTER — Other Ambulatory Visit: Payer: Self-pay | Admitting: Family Medicine

## 2017-12-16 LAB — HM DIABETES EYE EXAM

## 2017-12-23 ENCOUNTER — Encounter: Payer: Self-pay | Admitting: General Practice

## 2018-02-16 ENCOUNTER — Other Ambulatory Visit: Payer: Self-pay

## 2018-02-16 ENCOUNTER — Encounter: Payer: Self-pay | Admitting: Family Medicine

## 2018-02-16 ENCOUNTER — Ambulatory Visit: Payer: PRIVATE HEALTH INSURANCE | Admitting: Family Medicine

## 2018-02-16 VITALS — BP 122/82 | HR 65 | Temp 97.8°F | Resp 15 | Ht 71.0 in | Wt 262.8 lb

## 2018-02-16 DIAGNOSIS — E119 Type 2 diabetes mellitus without complications: Secondary | ICD-10-CM | POA: Diagnosis not present

## 2018-02-16 DIAGNOSIS — I10 Essential (primary) hypertension: Secondary | ICD-10-CM | POA: Diagnosis not present

## 2018-02-16 LAB — CBC WITH DIFFERENTIAL/PLATELET
Basophils Absolute: 0.1 10*3/uL (ref 0.0–0.1)
Basophils Relative: 0.8 % (ref 0.0–3.0)
Eosinophils Absolute: 0.3 10*3/uL (ref 0.0–0.7)
Eosinophils Relative: 3.9 % (ref 0.0–5.0)
HEMATOCRIT: 47.1 % (ref 39.0–52.0)
HEMOGLOBIN: 15.9 g/dL (ref 13.0–17.0)
LYMPHS PCT: 36.9 % (ref 12.0–46.0)
Lymphs Abs: 2.8 10*3/uL (ref 0.7–4.0)
MCHC: 33.7 g/dL (ref 30.0–36.0)
MCV: 86.3 fl (ref 78.0–100.0)
MONO ABS: 0.5 10*3/uL (ref 0.1–1.0)
MONOS PCT: 6.3 % (ref 3.0–12.0)
Neutro Abs: 4 10*3/uL (ref 1.4–7.7)
Neutrophils Relative %: 52.1 % (ref 43.0–77.0)
Platelets: 189 10*3/uL (ref 150.0–400.0)
RBC: 5.46 Mil/uL (ref 4.22–5.81)
RDW: 14.1 % (ref 11.5–15.5)
WBC: 7.6 10*3/uL (ref 4.0–10.5)

## 2018-02-16 LAB — LIPID PANEL
CHOL/HDL RATIO: 4
Cholesterol: 139 mg/dL (ref 0–200)
HDL: 37.6 mg/dL — AB (ref >39.00)
LDL CALC: 82 mg/dL (ref 0–99)
NONHDL: 101.74
TRIGLYCERIDES: 97 mg/dL (ref 0.0–149.0)
VLDL: 19.4 mg/dL (ref 0.0–40.0)

## 2018-02-16 LAB — HEPATIC FUNCTION PANEL
ALBUMIN: 4.5 g/dL (ref 3.5–5.2)
ALT: 30 U/L (ref 0–53)
AST: 17 U/L (ref 0–37)
Alkaline Phosphatase: 70 U/L (ref 39–117)
Bilirubin, Direct: 0.1 mg/dL (ref 0.0–0.3)
Total Bilirubin: 0.5 mg/dL (ref 0.2–1.2)
Total Protein: 7.4 g/dL (ref 6.0–8.3)

## 2018-02-16 LAB — BASIC METABOLIC PANEL
BUN: 22 mg/dL (ref 6–23)
CALCIUM: 9.6 mg/dL (ref 8.4–10.5)
CO2: 26 mEq/L (ref 19–32)
CREATININE: 0.83 mg/dL (ref 0.40–1.50)
Chloride: 104 mEq/L (ref 96–112)
GFR: 99.93 mL/min (ref >60.00)
Glucose, Bld: 125 mg/dL — ABNORMAL HIGH (ref 70–99)
Potassium: 4.1 mEq/L (ref 3.5–5.1)
Sodium: 137 mEq/L (ref 135–145)

## 2018-02-16 LAB — HEMOGLOBIN A1C: Hgb A1c MFr Bld: 6.5 % (ref 4.6–6.5)

## 2018-02-16 LAB — TSH: TSH: 1.5 u[IU]/mL (ref 0.35–4.50)

## 2018-02-16 NOTE — Patient Instructions (Signed)
Follow up in 3-4 months to recheck sugars We'll notify you of your lab results and make any changes if needed Continue to work on healthy diet and regular exercise- you can do it! Call with any questions or concerns Enjoy the rest of your summer!!

## 2018-02-16 NOTE — Assessment & Plan Note (Signed)
Chronic problem.  UTD on eye exam.  Foot exam done today.  UTD on microalbumin.  Stressed need for healthy diet and regular exercise.  Check labs.  Adjust meds prn

## 2018-02-16 NOTE — Assessment & Plan Note (Signed)
Deteriorated.  Pt has gained 10 lbs since last visit.  Given his DM and HTN, this qualifies as morbid obesity.  Check labs to risk stratify.  Will follow.

## 2018-02-16 NOTE — Assessment & Plan Note (Signed)
Chronic problem.  Adequate control.  Asymptomatic.  Check labs.  No anticipated med changes.  Will follow. 

## 2018-02-16 NOTE — Progress Notes (Signed)
   Subjective:    Patient ID: Jordan RisenKenneth W Birdwell, male    DOB: 1955-08-09, 62 y.o.   MRN: [id number]  HPI DM- chronic problem, on Metformin 500mg  BID.  UTD on eye exam, microalbumin.  Due for foot exam.  'i'm just extremely tired'.  Rare symptomatic lows.  No numbness/tingling of hands/feet but 'they are hypersensitive'.  HTN- chronic problem.  On HCTZ daily w/ good control.  No CP, SOB, HAs, visual changes, edema.  Obesity- pt has gained 10 lbs since last visit.  BMI now 36.7 and this qualifies as morbidly obese as he has both DM and HTN.  Pt doesn't wake feeling rested.  Wearing CPAP regularly.   Review of Systems For ROS see HPI     Objective:   Physical Exam  Constitutional: He is oriented to person, place, and time. He appears well-developed and well-nourished. No distress.  HENT:  Head: Normocephalic and atraumatic.  Eyes: Pupils are equal, round, and reactive to light. Conjunctivae and EOM are normal.  Neck: Normal range of motion. Neck supple. No thyromegaly present.  Cardiovascular: Normal rate, regular rhythm, normal heart sounds and intact distal pulses.  No murmur heard. Pulmonary/Chest: Effort normal and breath sounds normal. No respiratory distress.  Abdominal: Soft. Bowel sounds are normal. He exhibits no distension.  Musculoskeletal: He exhibits no edema.  Lymphadenopathy:    He has no cervical adenopathy.  Neurological: He is alert and oriented to person, place, and time. No cranial nerve deficit.  Skin: Skin is warm and dry.  Psychiatric: He has a normal mood and affect. His behavior is normal.  Vitals reviewed.         Assessment & Plan:

## 2018-02-17 ENCOUNTER — Encounter: Payer: Self-pay | Admitting: General Practice

## 2018-03-01 ENCOUNTER — Encounter: Payer: Self-pay | Admitting: Cardiology

## 2018-03-01 ENCOUNTER — Ambulatory Visit: Payer: PRIVATE HEALTH INSURANCE | Admitting: Cardiology

## 2018-03-01 VITALS — BP 132/68 | HR 63 | Ht 71.0 in | Wt 263.8 lb

## 2018-03-01 DIAGNOSIS — I10 Essential (primary) hypertension: Secondary | ICD-10-CM | POA: Diagnosis not present

## 2018-03-01 DIAGNOSIS — G4733 Obstructive sleep apnea (adult) (pediatric): Secondary | ICD-10-CM

## 2018-03-01 NOTE — Patient Instructions (Signed)
Medication Instructions:  Your physician recommends that you continue on your current medications as directed. Please refer to the Current Medication list given to you today.  Follow-Up: Your physician wants you to follow-up in: 1 year with Dr. Turner. You will receive a reminder letter in the mail two months in advance. If you don't receive a letter, please call our office to schedule the follow-up appointment.   If you need a refill on your cardiac medications before your next appointment, please call your pharmacy.   

## 2018-03-01 NOTE — Progress Notes (Signed)
Cardiology Office Note:    Date:  03/01/2018   ID:  Jordan GrizzleKenneth W Mcconnell, DOB 25-Oct-1955, MRN [id number]  PCP:  Sheliah Hatchabori, Katherine E, MD  Cardiologist:  No primary care provider on file.    Referring MD: Sheliah Hatchabori, Katherine E, MD   Chief Complaint  Patient presents with  . Sleep Apnea  . Hypertension    History of Present Illness:    Jordan Mcconnell is a 62 y.o. male with a hx of OSA, HTN and obesity.  He is doing well with his CPAP device.  He tolerates the mask and he feels the pressure is adequate.  Since going on CPAP he feels rested in the am and has no significant daytime sleepiness.   He denies any significant mouth or nasal dryness or nasal congestion.  He does not think that he snores.     Past Medical History:  Diagnosis Date  . Complication of anesthesia    one surgery pt. states he was hard to wake up  . GERD (gastroesophageal reflux disease)   . HTN (hypertension)   . 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 Obesity   . OSA (obstructive sleep apnea)    uses CPAP    Past Surgical History:  Procedure Laterality Date  . BACK SURGERY  08/2015  . COLONOSCOPY    . ESOPHAGEAL DILATION     1 or 2 times  . ESOPHAGOGASTRODUODENOSCOPY N/A 10/30/2013   Procedure: ESOPHAGOGASTRODUODENOSCOPY (EGD);  Surgeon: Charolett BumpersMartin K Johnson, MD;  Location: [facility];  Service: Endoscopy;  Laterality: N/A;  . JOINT REPLACEMENT     bilateral knees  . SHOULDER ARTHROSCOPY     Bilateral 3 times each shoulder    Current Medications: Current Meds  Medication Sig  . hydrochlorothiazide (HYDRODIURIL) 25 MG tablet TAKE 1 TABLET BY MOUTH EVERY DAY *INS ALLOWS 30 DAYS  . metFORMIN (GLUCOPHAGE) 500 MG tablet TAKE 1 TABLET BY MOUTH TWICE A DAY WITH FOOD     Allergies:   Penicillins; Sulfa antibiotics; Codeine; and Tramadol   Social History   Socioeconomic History  . Marital status: Married    Spouse name: Not on file  . Number of children: Not on file  . Years of education: Not  on file  . Highest education level: Not on file  Occupational History  . Not on file  Social Needs  . Financial resource strain: Not on file  . Food insecurity:    Worry: Not on file    Inability: Not on file  . Transportation needs:    Medical: Not on file    Non-medical: Not on file  Tobacco Use  . Smoking status: Never Smoker  . Smokeless tobacco: Never Used  Substance and Sexual Activity  . Alcohol use: No  . Drug use: No  . Sexual activity: Not on file  Lifestyle  . Physical activity:    Days per week: Not on file    Minutes per session: Not on file  . Stress: Not on file  Relationships  . Social connections:    Talks on phone: Not on file    Gets together: Not on file    Attends religious service: Not on file    Active member of club or organization: Not on file    Attends meetings of clubs or organizations: Not on file    Relationship status: Not on file  Other Topics Concern  . Not on file  Social History Narrative  . Not on file  Family History: The patient's family history includes Cancer in his father and maternal grandfather; Colon cancer in his father; Dementia in his father; Heart murmur in his mother; Memory loss in his maternal grandmother and mother; Stroke in his father.  ROS:   Please see the history of present illness.    ROS  All other systems reviewed and negative.   EKGs/Labs/Other Studies Reviewed:    The following studies were reviewed today: PAP download  EKG:  EKG is not ordered today.  T  Recent Labs: 02/16/2018: ALT 30; BUN 22; Creatinine, Ser 0.83; Hemoglobin 15.9; Platelets 189.0; Potassium 4.1; Sodium 137; TSH 1.50   Recent Lipid Panel    Component Value Date/Time   CHOL 139 02/16/2018 0919   TRIG 97.0 02/16/2018 0919   HDL 37.60 (L) 02/16/2018 0919   CHOLHDL 4 02/16/2018 0919   VLDL 19.4 02/16/2018 0919   LDLCALC 82 02/16/2018 0919   LDLDIRECT 107.0 07/07/2016 1415    Physical Exam:    VS:  BP 132/68   Pulse 63    Ht 5\' 11"  (1.803 m)   Wt 263 lb 12.8 oz (119.7 kg)   SpO2 97%   BMI 36.79 kg/m     Wt Readings from Last 3 Encounters:  03/01/18 263 lb 12.8 oz (119.7 kg)  02/16/18 262 lb 12.8 oz (119.2 kg)  10/18/17 253 lb (114.8 kg)     GEN:  Well nourished, well developed in no acute distress HEENT: Normal NECK: No JVD; No carotid bruits LYMPHATICS: No lymphadenopathy CARDIAC: RRR, no murmurs, rubs, gallops RESPIRATORY:  Clear to auscultation without rales, wheezing or rhonchi  ABDOMEN: Soft, non-tender, non-distended MUSCULOSKELETAL:  No edema; No deformity  SKIN: Warm and dry NEUROLOGIC:  Alert and oriented x 3 PSYCHIATRIC:  Normal affect   ASSESSMENT:    1. OSA (obstructive sleep apnea)   2. Essential hypertension   3. Morbid obesity (HCC)    PLAN:    In order of problems listed above:  1.  OSA - the patient is tolerating PAP therapy well without any problems. The PAP download was reviewed today and showed an AHI of 3/hr on 12 cm H2O with 99% compliance in using more than 4 hours nightly.  The patient has been using and benefiting from PAP use and will continue to benefit from therapy.   2.  HTN - BP is well controlled on exam today.  He will continue on HCTZ 25mg  daily.    3.  Obesity - I have encouraged him to get into a routine exercise program and cut back on carbs and portions.    Medication Adjustments/Labs and Tests Ordered: Current medicines are reviewed at length with the patient today.  Concerns regarding medicines are outlined above.  No orders of the defined types were placed in this encounter.  No orders of the defined types were placed in this encounter.   Signed, Armanda Magic, MD  03/01/2018 8:05 AM    [facility]

## 2018-06-14 ENCOUNTER — Ambulatory Visit: Payer: PRIVATE HEALTH INSURANCE | Admitting: Family Medicine

## 2018-06-15 ENCOUNTER — Other Ambulatory Visit: Payer: Self-pay | Admitting: Cardiology

## 2018-06-21 ENCOUNTER — Ambulatory Visit (INDEPENDENT_AMBULATORY_CARE_PROVIDER_SITE_OTHER): Payer: PRIVATE HEALTH INSURANCE

## 2018-06-21 ENCOUNTER — Other Ambulatory Visit: Payer: Self-pay

## 2018-06-21 ENCOUNTER — Ambulatory Visit: Payer: PRIVATE HEALTH INSURANCE | Admitting: Family Medicine

## 2018-06-21 ENCOUNTER — Encounter: Payer: Self-pay | Admitting: Family Medicine

## 2018-06-21 ENCOUNTER — Encounter: Payer: Self-pay | Admitting: General Practice

## 2018-06-21 VITALS — BP 130/68 | HR 82 | Temp 98.1°F | Resp 18 | Ht 71.0 in | Wt 269.2 lb

## 2018-06-21 DIAGNOSIS — E119 Type 2 diabetes mellitus without complications: Secondary | ICD-10-CM

## 2018-06-21 DIAGNOSIS — R0602 Shortness of breath: Secondary | ICD-10-CM

## 2018-06-21 DIAGNOSIS — R61 Generalized hyperhidrosis: Secondary | ICD-10-CM

## 2018-06-21 DIAGNOSIS — R071 Chest pain on breathing: Secondary | ICD-10-CM

## 2018-06-21 DIAGNOSIS — R0600 Dyspnea, unspecified: Secondary | ICD-10-CM | POA: Insufficient documentation

## 2018-06-21 LAB — CBC WITH DIFFERENTIAL/PLATELET
Basophils Absolute: 0.1 10*3/uL (ref 0.0–0.1)
Basophils Relative: 0.7 % (ref 0.0–3.0)
EOS PCT: 4.3 % (ref 0.0–5.0)
Eosinophils Absolute: 0.4 10*3/uL (ref 0.0–0.7)
HCT: 49.6 % (ref 39.0–52.0)
Hemoglobin: 16.8 g/dL (ref 13.0–17.0)
Lymphocytes Relative: 32.4 % (ref 12.0–46.0)
Lymphs Abs: 2.9 10*3/uL (ref 0.7–4.0)
MCHC: 34 g/dL (ref 30.0–36.0)
MCV: 86.9 fl (ref 78.0–100.0)
Monocytes Absolute: 1.1 10*3/uL — ABNORMAL HIGH (ref 0.1–1.0)
Monocytes Relative: 12.8 % — ABNORMAL HIGH (ref 3.0–12.0)
Neutro Abs: 4.5 10*3/uL (ref 1.4–7.7)
Neutrophils Relative %: 49.8 % (ref 43.0–77.0)
Platelets: 214 10*3/uL (ref 150.0–400.0)
RBC: 5.71 Mil/uL (ref 4.22–5.81)
RDW: 14 % (ref 11.5–15.5)
WBC: 9 10*3/uL (ref 4.0–10.5)

## 2018-06-21 LAB — HEMOGLOBIN A1C: Hgb A1c MFr Bld: 6.8 % — ABNORMAL HIGH (ref 4.6–6.5)

## 2018-06-21 LAB — BASIC METABOLIC PANEL
BUN: 23 mg/dL (ref 6–23)
CHLORIDE: 97 meq/L (ref 96–112)
CO2: 31 meq/L (ref 19–32)
Calcium: 10.2 mg/dL (ref 8.4–10.5)
Creatinine, Ser: 0.89 mg/dL (ref 0.40–1.50)
GFR: 92.09 mL/min (ref >60.00)
GLUCOSE: 123 mg/dL — AB (ref 70–99)
POTASSIUM: 4.3 meq/L (ref 3.5–5.1)
Sodium: 139 mEq/L (ref 135–145)

## 2018-06-21 LAB — MICROALBUMIN / CREATININE URINE RATIO
Creatinine,U: 110.5 mg/dL
MICROALB UR: 2.4 mg/dL — AB (ref 0.0–1.9)
Microalb Creat Ratio: 2.2 mg/g (ref 0.0–30.0)

## 2018-06-21 LAB — TSH: TSH: 2.93 u[IU]/mL (ref 0.35–4.50)

## 2018-06-21 NOTE — Patient Instructions (Addendum)
Please go to [facility] office and get your chest xray Schedule your complete physical in 3-4 months  We'll notify you of your lab results and make any changes if needed We'll call you with your cardiology and pulmonary appts to assess the shortness of breath If you have worsening pain or shortness of breath, please call or go to the ER for evaluation Call with any questions or concerns Hang in there!!

## 2018-06-21 NOTE — Assessment & Plan Note (Signed)
New.  Pt reports this has been going on for a few years but he has not shared this w/ me or his cardiologist.  He reports SOB will resolve w/ resting which concerns me for possible angina.  EKG here in office was WNL.  Discussed his weight gain as a contributing factor but given his diabetes, HTN, and obesity he is at high risk for CAD.  Will refer him back to Dr Mayford Knifeurner for complete cardiac evaluation.  Will also refer to Pulmonary for complete evaluation.  Pt expressed understanding and is in agreement w/ plan.

## 2018-06-21 NOTE — Progress Notes (Signed)
   Subjective:    Patient ID: Jordan Mcconnell, male    DOB: September 01, 1955, 62 y.o.   MRN: [id number]  HPI DM- chronic problem, on Metformin twice daily.  UTD on eye exam, foot exam.  Due for microalbumin.  Pt has gained 5 lbs since last visit.  Denies symptomatic lows.  No numbness/tingling of hands/feet.  SOB- pt reports his chest hurts 'all the time'.  Pain is internal, 'like you're trying to beat your way out'.  Has pain w/ sneezing and coughing.  + night sweats- 'it's been awhile'.  Continues to use CPAP.  Pt was swimming w/ daughter recently and was 'completely out of breath, gasping for air'.  sxs have been ongoing 'for a couple of years' but recently worsening.  SOB is now occurring w/ minimal exertion- taking out the trash.   Review of Systems For ROS see HPI     Objective:   Physical Exam Constitutional:      General: He is not in acute distress.    Appearance: He is well-developed.  HENT:     Head: Normocephalic and atraumatic.  Eyes:     Conjunctiva/sclera: Conjunctivae normal.     Pupils: Pupils are equal, round, and reactive to light.  Neck:     Musculoskeletal: Normal range of motion and neck supple.     Thyroid: No thyromegaly.  Cardiovascular:     Rate and Rhythm: Normal rate and regular rhythm.     Heart sounds: Normal heart sounds. No murmur.  Pulmonary:     Effort: Pulmonary effort is normal. No respiratory distress.     Breath sounds: Normal breath sounds.  Abdominal:     General: Bowel sounds are normal. There is no distension.     Palpations: Abdomen is soft.  Lymphadenopathy:     Cervical: No cervical adenopathy.  Skin:    General: Skin is warm and dry.  Neurological:     Mental Status: He is alert and oriented to person, place, and time.     Cranial Nerves: No cranial nerve deficit.  Psychiatric:        Behavior: Behavior normal.           Assessment & Plan:  Night sweats- new.  Will get CXR to assess.  Discussed that this could also be due to  hypoglycemia at night.  Will determine next steps based on CXR and lab results.  Pt expressed understanding and is in agreement w/ plan.   Chest pain on breathing- new.  Pt reports he will have sharp pain when sneezing or coughing.  This sounds musculoskeletal in nature but will proceed w/ Cardiac, Pulmonary work ups and CXR.  Pt expressed understanding and is in agreement w/ plan.

## 2018-06-23 ENCOUNTER — Telehealth: Payer: Self-pay | Admitting: Cardiology

## 2018-06-23 NOTE — Telephone Encounter (Signed)
Called patient to schedule with another cardiologist per instructions from Amelia Joonna Price.  Left a VM to call back to schedule an appointment with another cardiologist.

## 2018-07-10 ENCOUNTER — Telehealth: Payer: Self-pay

## 2018-07-10 NOTE — Telephone Encounter (Signed)
Ok to see a PA.  This is commonly the first step

## 2018-07-10 NOTE — Telephone Encounter (Signed)
Copied from CRM [id number]. Topic: Referral - Status >> Jul 10, 2018  1:41 PM Windy Kalata, NT wrote: Reason for CRM: patient states that he was supposed to see Dr. Mayford Knife cardiologist but she booked until March and they now have him see a PA. He wants to double check with Dr. Beverely Low and make sure that is okay. He is scheduled tomorrow at 1:30pm. 07/11/18. >> Jul 10, 2018  1:45 PM Windy Kalata, NT wrote: Patient also states that he has not heard from Pulmonary yet from the referral that was placed 06/21/18. I offered to give patient the phone number to that office but he is requesting that we send them another message and get them to reach out to him.

## 2018-07-10 NOTE — Telephone Encounter (Signed)
This was routed to  [facility] on 12/18. I don't see where pt has been called yet.

## 2018-07-10 NOTE — Telephone Encounter (Signed)
Patient notified of PCP recommendations and is agreement and expresses an understanding.   Ok for PEC to Discuss results / PCP recommendations / Schedule patient.   

## 2018-07-10 NOTE — Telephone Encounter (Signed)
Leeanne Deed if pt sees PA correct?   Marylene Land- can you look into this pulm referral still listed as new in the chart.

## 2018-07-11 ENCOUNTER — Ambulatory Visit: Payer: PRIVATE HEALTH INSURANCE | Admitting: Physician Assistant

## 2018-07-11 ENCOUNTER — Encounter: Payer: Self-pay | Admitting: Physician Assistant

## 2018-07-11 VITALS — BP 126/80 | HR 106 | Ht 71.0 in | Wt 274.0 lb

## 2018-07-11 DIAGNOSIS — I10 Essential (primary) hypertension: Secondary | ICD-10-CM | POA: Diagnosis not present

## 2018-07-11 DIAGNOSIS — R072 Precordial pain: Secondary | ICD-10-CM

## 2018-07-11 DIAGNOSIS — G4733 Obstructive sleep apnea (adult) (pediatric): Secondary | ICD-10-CM | POA: Diagnosis not present

## 2018-07-11 DIAGNOSIS — R079 Chest pain, unspecified: Secondary | ICD-10-CM | POA: Insufficient documentation

## 2018-07-11 MED ORDER — METOPROLOL TARTRATE 25 MG PO TABS: 25.0000 mg | ORAL_TABLET | Freq: Two times a day (BID) | ORAL | 3 refills | Status: DC

## 2018-07-11 MED ORDER — NITROGLYCERIN 0.4 MG SL SUBL: 0.4000 mg | SUBLINGUAL_TABLET | SUBLINGUAL | 3 refills | Status: DC | PRN

## 2018-07-11 NOTE — H&P (View-Only) (Signed)
Cardiology Office Note    Date:  07/11/2018   ID:  Margarita GrizzleKenneth W Hufstedler, DOB 02/15/1956, MRN [id number]  PCP:  Sheliah Hatchabori, Katherine E, MD  Cardiologist: Armanda Magicraci Turner, MD EPS: None  No chief complaint on file.   History of Present Illness:  Jordan Mcconnell is a 63 y.o. male with OSA on CPAP, HTN, obesity, DM type 2. Last saw Dr. Mayford Knifeurner 03/01/18 and doing well.  Patient was having chest pain when sneezing or coughing described as sharp when he saw his PCP 06/21/2018 so was referred here for follow-up.  Also had some shortness of breath.  Chest x-ray that day showed no acute cardiopulmonary abnormality.  Patient says his wife notices that he is short of breath. Was on a cruise 3 weeks ago and was swimming in the ocean and he became very short of breath and considers himself a good Counselling psychologistswimmer. Complains of heavy pressure, short of breath, sweaty, walking 1/4 mile. Walking in the house causes sob.Occassionally has chest pressure at rest lasts 1-2 min. Uncle with MI 60's,  Had chest pain yesterday while throwing a Frisbee to his dog.  Past Medical History:  Diagnosis Date  . Complication of anesthesia    one surgery pt. states he was hard to wake up  . GERD (gastroesophageal reflux disease)   . HTN (hypertension)   . 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 Obesity   . OSA (obstructive sleep apnea)    uses CPAP    Past Surgical History:  Procedure Laterality Date  . BACK SURGERY  08/2015  . COLONOSCOPY    . ESOPHAGEAL DILATION     1 or 2 times  . ESOPHAGOGASTRODUODENOSCOPY N/A 10/30/2013   Procedure: ESOPHAGOGASTRODUODENOSCOPY (EGD);  Surgeon: Charolett BumpersMartin K Johnson, MD;  [facility];  Service: Endoscopy;  Laterality: N/A;  . JOINT REPLACEMENT     bilateral knees  . SHOULDER ARTHROSCOPY     Bilateral 3 times each shoulder    Current Medications: Current Meds  Medication Sig  . hydrochlorothiazide (HYDRODIURIL) 25 MG tablet TAKE 1 TABLET BY MOUTH EVERY DAY *INS ALLOWS  30 DAYS  . metFORMIN (GLUCOPHAGE) 500 MG tablet TAKE 1 TABLET BY MOUTH TWICE A DAY WITH FOOD     Allergies:   Penicillins; Sulfa antibiotics; Codeine; and Tramadol   Social History   Socioeconomic History  . Marital status: Married    Spouse name: Not on file  . Number of children: Not on file  . Years of education: Not on file  . Highest education level: Not on file  Occupational History  . Not on file  Social Needs  . Financial resource strain: Not on file  . Food insecurity:    Worry: Not on file    Inability: Not on file  . Transportation needs:    Medical: Not on file    Non-medical: Not on file  Tobacco Use  . Smoking status: Never Smoker  . Smokeless tobacco: Never Used  Substance and Sexual Activity  . Alcohol use: No  . Drug use: No  . Sexual activity: Not on file  Lifestyle  . Physical activity:    Days per week: Not on file    Minutes per session: Not on file  . Stress: Not on file  Relationships  . Social connections:    Talks on phone: Not on file    Gets together: Not on file    Attends religious service: Not on file    Active member of  club or organization: Not on file    Attends meetings of clubs or organizations: Not on file    Relationship status: Not on file  Other Topics Concern  . Not on file  Social History Narrative  . Not on file     Family History:  The patient's family history includes Cancer in his father and maternal grandfather; Colon cancer in his father; Dementia in his father; Heart murmur in his mother; Memory loss in his maternal grandmother and mother; Stroke in his father.   ROS:   Please see the history of present illness.    Review of Systems  Constitution: Negative.  HENT: Negative.   Cardiovascular: Positive for chest pain and dyspnea on exertion.  Respiratory: Negative.   Endocrine: Negative.   Hematologic/Lymphatic: Negative.   Musculoskeletal: Negative.   Gastrointestinal: Negative.   Genitourinary: Negative.     Neurological: Negative.    All other systems reviewed and are negative.   PHYSICAL EXAM:   VS:  BP 126/80   Pulse (!) 106   Ht 5\' 11"  (1.803 m)   Wt 274 lb (124.3 kg)   SpO2 97%   BMI 38.22 kg/m   Physical Exam  GEN: Obese, in no acute distress  Neck: no JVD, carotid bruits, or masses Cardiac:RRR; no murmurs, rubs, or gallops  Respiratory:  clear to auscultation bilaterally, normal work of breathing GI: soft, nontender, nondistended, + BS Ext: without cyanosis, clubbing, or edema, Good distal pulses bilaterally Neuro:  Alert and Oriented x 3Psych: euthymic mood, full affect  Wt Readings from Last 3 Encounters:  07/11/18 274 lb (124.3 kg)  06/21/18 269 lb 4 oz (122.1 kg)  03/01/18 263 lb 12.8 oz (119.7 kg)      Studies/Labs Reviewed:   EKG:  EKG is  ordered today.  The ekg ordered today demonstrates NSR 99/m with nonspecific ST changes  Recent Labs: 02/16/2018: ALT 30 06/21/2018: BUN 23; Creatinine, Ser 0.89; Hemoglobin 16.8; Platelets 214.0; Potassium 4.3; Sodium 139; TSH 2.93   Lipid Panel    Component Value Date/Time   CHOL 139 02/16/2018 0919   TRIG 97.0 02/16/2018 0919   HDL 37.60 (L) 02/16/2018 0919   CHOLHDL 4 02/16/2018 0919   VLDL 19.4 02/16/2018 0919   LDLCALC 82 02/16/2018 0919   LDLDIRECT 107.0 07/07/2016 1415    Additional studies/ records that were reviewed today include:       ASSESSMENT:    1. Essential hypertension   2. OSA (obstructive sleep apnea)   3. Precordial pain      PLAN:  In order of problems listed above:  Chest pain and dyspnea on exertion with little activity worrisome for ischemia. Recommend cardiac catheterization to evaluate further.  Discussed with Dr. Ladona Ridgelaylor who concurs.  Will start low-dose metoprolol 25 mg twice daily as he was tachycardic when he arrived.  Also nitroglycerin as needed.  Cath scheduled for this Thursday with Dr. Eldridge DaceVaranasi. I have reviewed the risks, indications, and alternatives to angioplasty and  stenting with the patient. Risks include but are not limited to bleeding, infection, vascular injury, stroke, myocardial infection, arrhythmia, kidney injury, radiation-related injury in the case of prolonged fluoroscopy use, emergency cardiac surgery, and death. The patient understands the risks of serious complication is low (<1%) and patient agrees to proceed.    Dyspnea on exertion partially secondary to obesity but also worried for angina.  Has appointment with pulmonary as well.  Essential hypertension BP stable  Hyperlipidemia LDL 82 02/16/2018 not on medication.  Will wait till after cath to start medication  DM type 2 on metformin will hold morning of in 48 hours after.  OSA on CPAP    Medication Adjustments/Labs and Tests Ordered: Current medicines are reviewed at length with the patient today.  Concerns regarding medicines are outlined above.  Medication changes, Labs and Tests ordered today are listed in the Patient Instructions below. There are no Patient Instructions on file for this visit.   Elson Clan, PA-C  07/11/2018 2:04 PM    [facility] [address] [place], [place], [place]  [address] Phone: [phone]; Fax: [phone]

## 2018-07-11 NOTE — Telephone Encounter (Signed)
Calling pulm to check on the status.

## 2018-07-11 NOTE — Addendum Note (Signed)
Addended by: Daleen Bo I on: 07/11/2018 03:04 PM   Modules accepted: Orders

## 2018-07-11 NOTE — Progress Notes (Signed)
 Cardiology Office Note    Date:  07/11/2018   ID:  Jordan Mcconnell, DOB 09/14/1955, MRN [id number]  PCP:  Tabori, Katherine E, MD  Cardiologist: Traci Turner, MD EPS: None  No chief complaint on file.   History of Present Illness:  Jordan Mcconnell is a 62 y.o. male with OSA on CPAP, HTN, obesity, DM type 2. Last saw Dr. Turner 03/01/18 and doing well.  Patient was having chest pain when sneezing or coughing described as sharp when he saw his PCP 06/21/2018 so was referred here for follow-up.  Also had some shortness of breath.  Chest x-ray that day showed no acute cardiopulmonary abnormality.  Patient says his wife notices that he is short of breath. Was on a cruise 3 weeks ago and was swimming in the ocean and he became very short of breath and considers himself a good swimmer. Complains of heavy pressure, short of breath, sweaty, walking 1/4 mile. Walking in the house causes sob.Occassionally has chest pressure at rest lasts 1-2 min. Uncle with MI 60's,  Had chest pain yesterday while throwing a Frisbee to his dog.  Past Medical History:  Diagnosis Date  . Complication of anesthesia    one surgery pt. states he was hard to wake up  . GERD (gastroesophageal reflux disease)   . HTN (hypertension)   . 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 Obesity   . OSA (obstructive sleep apnea)    uses CPAP    Past Surgical History:  Procedure Laterality Date  . BACK SURGERY  08/2015  . COLONOSCOPY    . ESOPHAGEAL DILATION     1 or 2 times  . ESOPHAGOGASTRODUODENOSCOPY N/A 10/30/2013   Procedure: ESOPHAGOGASTRODUODENOSCOPY (EGD);  Surgeon: Martin K Johnson, MD;  Location: [facility];  Service: Endoscopy;  Laterality: N/A;  . JOINT REPLACEMENT     bilateral knees  . SHOULDER ARTHROSCOPY     Bilateral 3 times each shoulder    Current Medications: Current Meds  Medication Sig  . hydrochlorothiazide (HYDRODIURIL) 25 MG tablet TAKE 1 TABLET BY MOUTH EVERY DAY *INS ALLOWS  30 DAYS  . metFORMIN (GLUCOPHAGE) 500 MG tablet TAKE 1 TABLET BY MOUTH TWICE A DAY WITH FOOD     Allergies:   Penicillins; Sulfa antibiotics; Codeine; and Tramadol   Social History   Socioeconomic History  . Marital status: Married    Spouse name: Not on file  . Number of children: Not on file  . Years of education: Not on file  . Highest education level: Not on file  Occupational History  . Not on file  Social Needs  . Financial resource strain: Not on file  . Food insecurity:    Worry: Not on file    Inability: Not on file  . Transportation needs:    Medical: Not on file    Non-medical: Not on file  Tobacco Use  . Smoking status: Never Smoker  . Smokeless tobacco: Never Used  Substance and Sexual Activity  . Alcohol use: No  . Drug use: No  . Sexual activity: Not on file  Lifestyle  . Physical activity:    Days per week: Not on file    Minutes per session: Not on file  . Stress: Not on file  Relationships  . Social connections:    Talks on phone: Not on file    Gets together: Not on file    Attends religious service: Not on file    Active member of   club or organization: Not on file    Attends meetings of clubs or organizations: Not on file    Relationship status: Not on file  Other Topics Concern  . Not on file  Social History Narrative  . Not on file     Family History:  The patient's family history includes Cancer in his father and maternal grandfather; Colon cancer in his father; Dementia in his father; Heart murmur in his mother; Memory loss in his maternal grandmother and mother; Stroke in his father.   ROS:   Please see the history of present illness.    Review of Systems  Constitution: Negative.  HENT: Negative.   Cardiovascular: Positive for chest pain and dyspnea on exertion.  Respiratory: Negative.   Endocrine: Negative.   Hematologic/Lymphatic: Negative.   Musculoskeletal: Negative.   Gastrointestinal: Negative.   Genitourinary: Negative.     Neurological: Negative.    All other systems reviewed and are negative.   PHYSICAL EXAM:   VS:  BP 126/80   Pulse (!) 106   Ht 5\' 11"  (1.803 m)   Wt 274 lb (124.3 kg)   SpO2 97%   BMI 38.22 kg/m   Physical Exam  GEN: Obese, in no acute distress  Neck: no JVD, carotid bruits, or masses Cardiac:RRR; no murmurs, rubs, or gallops  Respiratory:  clear to auscultation bilaterally, normal work of breathing GI: soft, nontender, nondistended, + BS Ext: without cyanosis, clubbing, or edema, Good distal pulses bilaterally Neuro:  Alert and Oriented x 3Psych: euthymic mood, full affect  Wt Readings from Last 3 Encounters:  07/11/18 274 lb (124.3 kg)  06/21/18 269 lb 4 oz (122.1 kg)  03/01/18 263 lb 12.8 oz (119.7 kg)      Studies/Labs Reviewed:   EKG:  EKG is  ordered today.  The ekg ordered today demonstrates NSR 99/m with nonspecific ST changes  Recent Labs: 02/16/2018: ALT 30 06/21/2018: BUN 23; Creatinine, Ser 0.89; Hemoglobin 16.8; Platelets 214.0; Potassium 4.3; Sodium 139; TSH 2.93   Lipid Panel    Component Value Date/Time   CHOL 139 02/16/2018 0919   TRIG 97.0 02/16/2018 0919   HDL 37.60 (L) 02/16/2018 0919   CHOLHDL 4 02/16/2018 0919   VLDL 19.4 02/16/2018 0919   LDLCALC 82 02/16/2018 0919   LDLDIRECT 107.0 07/07/2016 1415    Additional studies/ records that were reviewed today include:       ASSESSMENT:    1. Essential hypertension   2. OSA (obstructive sleep apnea)   3. Precordial pain      PLAN:  In order of problems listed above:  Chest pain and dyspnea on exertion with little activity worrisome for ischemia. Recommend cardiac catheterization to evaluate further.  Discussed with Dr. Ladona Ridgelaylor who concurs.  Will start low-dose metoprolol 25 mg twice daily as he was tachycardic when he arrived.  Also nitroglycerin as needed.  Cath scheduled for this Thursday with Dr. Eldridge DaceVaranasi. I have reviewed the risks, indications, and alternatives to angioplasty and  stenting with the patient. Risks include but are not limited to bleeding, infection, vascular injury, stroke, myocardial infection, arrhythmia, kidney injury, radiation-related injury in the case of prolonged fluoroscopy use, emergency cardiac surgery, and death. The patient understands the risks of serious complication is low (<1%) and patient agrees to proceed.    Dyspnea on exertion partially secondary to obesity but also worried for angina.  Has appointment with pulmonary as well.  Essential hypertension BP stable  Hyperlipidemia LDL 82 02/16/2018 not on medication.  Will wait till after cath to start medication  DM type 2 on metformin will hold morning of in 48 hours after.  OSA on CPAP    Medication Adjustments/Labs and Tests Ordered: Current medicines are reviewed at length with the patient today.  Concerns regarding medicines are outlined above.  Medication changes, Labs and Tests ordered today are listed in the Patient Instructions below. There are no Patient Instructions on file for this visit.   Elson Clan, PA-C  07/11/2018 2:04 PM    [facility] [address] [place], [place], [place]  [address] Phone: [phone]; Fax: [phone]

## 2018-07-11 NOTE — Patient Instructions (Addendum)
Medication Instructions:  Your physician has recommended you make the following change in your medication:   1. START: metoprolol tartrate (lopressor) 25 mg twice a day  2. A prescription was sent in for Sublingual Nitroglycerin AS NEEDED: Use as directed   If you need a refill on your cardiac medications before your next appointment, please call your pharmacy.   Lab work: None ordered (recent labs on 06/21/18)  If you have labs (blood work) drawn today and your tests are completely normal, you will receive your results only by: Marland Kitchen MyChart Message (if you have MyChart) OR . A paper copy in the mail If you have any lab test that is abnormal or we need to change your treatment, we will call you to review the results.  Testing/Procedures: Your physician has requested that you have a cardiac catheterization on 07/13/18. Cardiac catheterization is used to diagnose and/or treat various heart conditions. Doctors may recommend this procedure for a number of different reasons. The most common reason is to evaluate chest pain. Chest pain can be a symptom of coronary artery disease (CAD), and cardiac catheterization can show whether plaque is narrowing or blocking your heart's arteries. This procedure is also used to evaluate the valves, as well as measure the blood flow and oxygen levels in different parts of your heart. For further information please visit [email or web address] Please follow instruction sheet, as given.    Follow-Up: . Follow up with Jacolyn Reedy, PA on 08/02/18 at 11:30 AM  Any Other Special Instructions Will Be Listed Below (If Applicable).     [facility] CARDIOVASCULAR DIVISION [facility] ST OFFICE [address] [place], [address] [place] [address] Dept: [phone] Loc: [phone]  Staffon Saslow Zwick  07/11/2018  You are scheduled for a Cardiac Catheterization on Thursday, January 9 with Dr. Lance Muss.  1. Please arrive at  the [facility] (Main Entrance A) at [facility]: [address] [place], [place] [address] at 8:30 AM (This time is two hours before your procedure to ensure your preparation). Free valet parking service is available.   Special note: Every effort is made to have your procedure done on time. Please understand that emergencies sometimes delay scheduled procedures.  2. Diet: Do not eat solid foods after midnight.  The patient may have clear liquids until 5am upon the day of the procedure.  3. Labs: Done on 06/21/18  4. Medication instructions in preparation for your procedure:   Contrast Allergy: No  DO NOT take your metformin the morning of your procedure and for 48 hours after your procedure  DO NOT take your hydrochlorothiazide the morning of your procedure  On the morning of your procedure, take a baby Aspirin 81 mg and any morning medicines NOT listed above.  You may use sips of water.  5. Plan for one night stay--bring personal belongings. 6. Bring a current list of your medications and current insurance cards. 7. You MUST have a responsible person to drive you home. 8. Someone MUST be with you the first 24 hours after you arrive home or your discharge will be delayed. 9. Please wear clothes that are easy to get on and off and wear slip-on shoes.  Thank you for allowing [name] to care for you!   -- [facility]   Coronary Angiogram A coronary angiogram is an X-ray procedure that is used to examine the arteries in the heart. In this procedure, a dye (contrast dye) is  injected through a long, thin tube (catheter). The catheter is inserted through the groin, wrist, or arm. The dye is injected into each artery, then X-rays are taken to show if there is a blockage in the arteries of the heart. This procedure can also show if you have valve disease or a disease of the aorta, and it can be used to check the overall function of your heart muscle. You may  have a coronary angiogram if:  You are having chest pain, or other symptoms of angina, and you are at risk for heart disease.  You have an abnormal electrocardiogram (ECG) or stress test.  You have chest pain and heart failure.  You are having irregular heart rhythms.  You and your health care provider determine that the benefits of the test information outweigh the risks of the procedure. Let your health care provider know about:  Any allergies you have, including allergies to contrast dye.  All medicines you are taking, including vitamins, herbs, eye drops, creams, and over-the-counter medicines.  Any problems you or family members have had with anesthetic medicines.  Any blood disorders you have.  Any surgeries you have had.  History of kidney problems or kidney failure.  Any medical conditions you have.  Whether you are pregnant or may be pregnant. What are the risks? Generally, this is a safe procedure. However, problems may occur, including:  Infection.  Allergic reaction to medicines or dyes that are used.  Bleeding from the access site or other locations.  Kidney injury, especially in people with impaired kidney function.  Stroke (rare).  Heart attack (rare).  Damage to other structures or organs. What happens before the procedure? Staying hydrated Follow instructions from your health care provider about hydration, which may include:  Up to 2 hours before the procedure - you may continue to drink clear liquids, such as water, clear fruit juice, black coffee, and plain tea. Eating and drinking restrictions Follow instructions from your health care provider about eating and drinking, which may include:  8 hours before the procedure - stop eating heavy meals or foods such as meat, fried foods, or fatty foods.  6 hours before the procedure - stop eating light meals or foods, such as toast or cereal.  2 hours before the procedure - stop drinking clear  liquids. General instructions  Ask your health care provider about: ? Changing or stopping your regular medicines. This is especially important if you are taking diabetes medicines or blood thinners. ? Taking medicines such as ibuprofen. These medicines can thin your blood. Do not take these medicines before your procedure if your health care provider instructs you not to, though aspirin may be recommended prior to coronary angiograms.  Plan to have someone take you home from the hospital or clinic.  You may need to have blood tests or X-rays done. What happens during the procedure?  An IV tube will be inserted into one of your veins.  You will be given one or more of the following: ? A medicine to help you relax (sedative). ? A medicine to numb the area where the catheter will be inserted into an artery (local anesthetic).  To reduce your risk of infection: ? Your health care team will wash or sanitize their hands. ? Your skin will be washed with soap. ? Hair may be removed from the area where the catheter will be inserted.  You will be connected to a continuous ECG monitor.  The catheter will be inserted into  an artery. The location may be in your groin, in your wrist, or in the fold of your arm (near your elbow).  A type of X-ray (fluoroscopy) will be used to help guide the catheter to the opening of the blood vessel that is being examined.  A dye will be injected into the catheter, and X-rays will be taken. The dye will help to show where any narrowing or blockages are located in the heart arteries.  Tell your health care provider if you have any chest pain or trouble breathing during the procedure.  If blockages are found, your health care provider may perform another procedure, such as inserting a coronary stent. The procedure may vary among health care providers and hospitals. What happens after the procedure?  After the procedure, you will need to keep the area still for  a few hours, or for as long as told by your health care provider. If the procedure is done through the groin, you will be instructed to not bend and not cross your legs.  The insertion site will be checked frequently.  The pulse in your foot or wrist will be checked frequently.  You may have additional blood tests, X-rays, and a test that records the electrical activity of your heart (ECG).  Do not drive for 24 hours if you were given a sedative. Summary  A coronary angiogram is an X-ray procedure that is used to look into the arteries in the heart.  During the procedure, a dye (contrast dye) is injected through a long, thin tube (catheter). The catheter is inserted through the groin, wrist, or arm.  Tell your health care provider about any allergies you have, including allergies to contrast dye.  After the procedure, you will need to keep the area still for a few hours, or for as long as told by your health care provider. This information is not intended to replace advice given to you by your health care provider. Make sure you discuss any questions you have with your health care provider. Document Released: 12/26/2002 Document Revised: 04/02/2016 Document Reviewed: 04/02/2016 Elsevier Interactive Patient Education  2019 [employer].

## 2018-07-12 ENCOUNTER — Ambulatory Visit: Payer: PRIVATE HEALTH INSURANCE | Admitting: Nurse Practitioner

## 2018-07-13 ENCOUNTER — Encounter (HOSPITAL_COMMUNITY)
Admission: RE | Disposition: A | Discharge status: Home / Self Care | Payer: Self-pay | Source: Home / Self Care | Attending: Interventional Cardiology

## 2018-07-13 ENCOUNTER — Other Ambulatory Visit: Payer: Self-pay

## 2018-07-13 ENCOUNTER — Ambulatory Visit (HOSPITAL_COMMUNITY)
Admission: RE | Admit: 2018-07-13 | Discharge: 2018-07-13 | Disposition: A | Discharge status: Home / Self Care | Payer: PRIVATE HEALTH INSURANCE | Attending: Interventional Cardiology | Admitting: Interventional Cardiology

## 2018-07-13 DIAGNOSIS — Z6838 Body mass index (BMI) 38.0-38.9, adult: Secondary | ICD-10-CM | POA: Diagnosis not present

## 2018-07-13 DIAGNOSIS — Z882 Allergy status to sulfonamides status: Secondary | ICD-10-CM | POA: Diagnosis not present

## 2018-07-13 DIAGNOSIS — I10 Essential (primary) hypertension: Secondary | ICD-10-CM | POA: Diagnosis not present

## 2018-07-13 DIAGNOSIS — K580 Irritable bowel syndrome with diarrhea: Secondary | ICD-10-CM | POA: Insufficient documentation

## 2018-07-13 DIAGNOSIS — K219 Gastro-esophageal reflux disease without esophagitis: Secondary | ICD-10-CM | POA: Diagnosis not present

## 2018-07-13 DIAGNOSIS — G4733 Obstructive sleep apnea (adult) (pediatric): Secondary | ICD-10-CM | POA: Insufficient documentation

## 2018-07-13 DIAGNOSIS — Z7984 Long term (current) use of oral hypoglycemic drugs: Secondary | ICD-10-CM | POA: Diagnosis not present

## 2018-07-13 DIAGNOSIS — I2510 Atherosclerotic heart disease of native coronary artery without angina pectoris: Secondary | ICD-10-CM | POA: Diagnosis not present

## 2018-07-13 DIAGNOSIS — E785 Hyperlipidemia, unspecified: Secondary | ICD-10-CM | POA: Insufficient documentation

## 2018-07-13 DIAGNOSIS — R072 Precordial pain: Secondary | ICD-10-CM

## 2018-07-13 DIAGNOSIS — Z885 Allergy status to narcotic agent status: Secondary | ICD-10-CM | POA: Diagnosis not present

## 2018-07-13 DIAGNOSIS — E669 Obesity, unspecified: Secondary | ICD-10-CM | POA: Diagnosis not present

## 2018-07-13 DIAGNOSIS — E119 Type 2 diabetes mellitus without complications: Secondary | ICD-10-CM | POA: Diagnosis not present

## 2018-07-13 DIAGNOSIS — Z880 Allergy status to penicillin: Secondary | ICD-10-CM | POA: Insufficient documentation

## 2018-07-13 HISTORY — PX: LEFT HEART CATH AND CORONARY ANGIOGRAPHY: CATH118249

## 2018-07-13 LAB — GLUCOSE, CAPILLARY: Glucose-Capillary: 133 mg/dL — ABNORMAL HIGH (ref 70–99)

## 2018-07-13 SURGERY — LEFT HEART CATH AND CORONARY ANGIOGRAPHY
Anesthesia: LOCAL

## 2018-07-13 MED ORDER — ASPIRIN 81 MG PO CHEW: 81.0000 mg | CHEWABLE_TABLET | ORAL | Status: DC

## 2018-07-13 MED ORDER — FENTANYL CITRATE (PF) 100 MCG/2ML IJ SOLN
INTRAMUSCULAR | Status: DC | PRN
  Administered 2018-07-13 (×3): 25 ug via INTRAVENOUS

## 2018-07-13 MED ORDER — ACETAMINOPHEN 325 MG PO TABS: 650.0000 mg | ORAL_TABLET | ORAL | Status: DC | PRN

## 2018-07-13 MED ORDER — FENTANYL CITRATE (PF) 100 MCG/2ML IJ SOLN
INTRAMUSCULAR | Status: AC
  Filled 2018-07-13: qty 2

## 2018-07-13 MED ORDER — SODIUM CHLORIDE 0.9 % IV SOLN: INTRAVENOUS | Status: AC

## 2018-07-13 MED ORDER — MIDAZOLAM HCL 2 MG/2ML IJ SOLN
INTRAMUSCULAR | Status: AC
  Filled 2018-07-13: qty 2

## 2018-07-13 MED ORDER — VERAPAMIL HCL 2.5 MG/ML IV SOLN
INTRAVENOUS | Status: DC | PRN
  Administered 2018-07-13 (×2): 10 mL via INTRA_ARTERIAL

## 2018-07-13 MED ORDER — IOHEXOL 350 MG/ML SOLN
INTRAVENOUS | Status: DC | PRN
  Administered 2018-07-13: 145 mL via INTRACARDIAC

## 2018-07-13 MED ORDER — SODIUM CHLORIDE 0.9 % WEIGHT BASED INFUSION
3.0000 mL/kg/h | INTRAVENOUS | Status: AC
  Administered 2018-07-13: 3 mL/kg/h via INTRAVENOUS

## 2018-07-13 MED ORDER — HEPARIN (PORCINE) IN NACL 1000-0.9 UT/500ML-% IV SOLN
INTRAVENOUS | Status: DC | PRN
  Administered 2018-07-13 (×2): 500 mL

## 2018-07-13 MED ORDER — MIDAZOLAM HCL 2 MG/2ML IJ SOLN
INTRAMUSCULAR | Status: DC | PRN
  Administered 2018-07-13: 0.5 mg via INTRAVENOUS
  Administered 2018-07-13: 1 mg via INTRAVENOUS
  Administered 2018-07-13: 2 mg via INTRAVENOUS

## 2018-07-13 MED ORDER — SODIUM CHLORIDE 0.9% FLUSH: 3.0000 mL | Freq: Two times a day (BID) | INTRAVENOUS | Status: DC

## 2018-07-13 MED ORDER — HEPARIN SODIUM (PORCINE) 1000 UNIT/ML IJ SOLN
INTRAMUSCULAR | Status: DC | PRN
  Administered 2018-07-13: 5500 [IU] via INTRAVENOUS

## 2018-07-13 MED ORDER — SODIUM CHLORIDE 0.9% FLUSH: 3.0000 mL | INTRAVENOUS | Status: DC | PRN

## 2018-07-13 MED ORDER — SODIUM CHLORIDE 0.9 % IV SOLN: 250.0000 mL | INTRAVENOUS | Status: DC | PRN

## 2018-07-13 MED ORDER — ONDANSETRON HCL 4 MG/2ML IJ SOLN: 4.0000 mg | Freq: Four times a day (QID) | INTRAMUSCULAR | Status: DC | PRN

## 2018-07-13 MED ORDER — METFORMIN HCL 500 MG PO TABS: 500.0000 mg | ORAL_TABLET | Freq: Two times a day (BID) | ORAL | 6 refills | Status: DC

## 2018-07-13 MED ORDER — VERAPAMIL HCL 2.5 MG/ML IV SOLN
INTRAVENOUS | Status: AC
  Filled 2018-07-13: qty 2

## 2018-07-13 MED ORDER — SODIUM CHLORIDE 0.9 % WEIGHT BASED INFUSION: 1.0000 mL/kg/h | INTRAVENOUS | Status: DC

## 2018-07-13 MED ORDER — LIDOCAINE HCL (PF) 1 % IJ SOLN
INTRAMUSCULAR | Status: AC
  Filled 2018-07-13: qty 30

## 2018-07-13 MED ORDER — HEPARIN (PORCINE) IN NACL 1000-0.9 UT/500ML-% IV SOLN
INTRAVENOUS | Status: AC
  Filled 2018-07-13: qty 1000

## 2018-07-13 MED ORDER — LIDOCAINE HCL (PF) 1 % IJ SOLN
INTRAMUSCULAR | Status: DC | PRN
  Administered 2018-07-13: 2 mL

## 2018-07-13 SURGICAL SUPPLY — 12 items
CATH 5FR JL3.5 JR4 ANG PIG MP (CATHETERS) ×1 IMPLANT
CATH INFINITI 5 FR 3DRC (CATHETERS) ×1 IMPLANT
CATH INFINITI 5 FR AR1 MOD (CATHETERS) ×1 IMPLANT
CATH INFINITI 5FR AL1 (CATHETERS) ×1 IMPLANT
DEVICE RAD COMP TR BAND LRG (VASCULAR PRODUCTS) ×1 IMPLANT
GLIDESHEATH SLEND SS 6F .021 (SHEATH) ×1 IMPLANT
GUIDEWIRE INQWIRE 1.5J.035X260 (WIRE) IMPLANT
INQWIRE 1.5J .035X260CM (WIRE) ×2
KIT HEART LEFT (KITS) ×2 IMPLANT
PACK CARDIAC CATHETERIZATION (CUSTOM PROCEDURE TRAY) ×2 IMPLANT
TRANSDUCER W/STOPCOCK (MISCELLANEOUS) ×2 IMPLANT
TUBING CIL FLEX 10 FLL-RA (TUBING) ×2 IMPLANT

## 2018-07-13 NOTE — Interval H&P Note (Signed)
Cath Lab Visit (complete for each Cath Lab visit)  Clinical Evaluation Leading to the Procedure:   ACS: No.  Non-ACS:    Anginal Classification: CCS III  Anti-ischemic medical therapy: Minimal Therapy (1 class of medications)  Non-Invasive Test Results: No non-invasive testing performed  Prior CABG: No previous CABG      History and Physical Interval Note:  07/13/2018 11:18 AM  Jordan Mcconnell  has presented today for surgery, with the diagnosis of cp  The various methods of treatment have been discussed with the patient and family. After consideration of risks, benefits and other options for treatment, the patient has consented to  Procedure(s): LEFT HEART CATH AND CORONARY ANGIOGRAPHY (N/A) as a surgical intervention .  The patient's history has been reviewed, patient examined, no change in status, stable for surgery.  I have reviewed the patient's chart and labs.  Questions were answered to the patient's satisfaction.     Lance MussJayadeep Lillah Standre

## 2018-07-13 NOTE — Discharge Instructions (Signed)
Radial Site Care ° °This sheet gives you information about how to care for yourself after your procedure. Your health care provider may also give you more specific instructions. If you have problems or questions, contact your health care provider. °What can I expect after the procedure? °After the procedure, it is common to have: °· Bruising and tenderness at the catheter insertion area. °Follow these instructions at home: °Medicines °· Take over-the-counter and prescription medicines only as told by your health care provider. °Insertion site care °· Follow instructions from your health care provider about how to take care of your insertion site. Make sure you: °? Wash your hands with soap and water before you change your bandage (dressing). If soap and water are not available, use hand sanitizer. °? Change your dressing as told by your health care provider. °? Leave stitches (sutures), skin glue, or adhesive strips in place. These skin closures may need to stay in place for 2 weeks or longer. If adhesive strip edges start to loosen and curl up, you may trim the loose edges. Do not remove adhesive strips completely unless your health care provider tells you to do that. °· Check your insertion site every day for signs of infection. Check for: °? Redness, swelling, or pain. °? Fluid or blood. °? Pus or a bad smell. °? Warmth. °· Do not take baths, swim, or use a hot tub until your health care provider approves. °· You may shower 24-48 hours after the procedure, or as directed by your health care provider. °? Remove the dressing and gently wash the site with plain soap and water. °? Pat the area dry with a clean towel. °? Do not rub the site. That could cause bleeding. °· Do not apply powder or lotion to the site. °Activity ° °· For 24 hours after the procedure, or as directed by your health care provider: °? Do not flex or bend the affected arm. °? Do not push or pull heavy objects with the affected arm. °? Do not  drive yourself home from the hospital or clinic. You may drive 24 hours after the procedure unless your health care provider tells you not to. °? Do not operate machinery or power tools. °· Do not lift anything that is heavier than 10 lb (4.5 kg), or the limit that you are told, until your health care provider says that it is safe. °· Ask your health care provider when it is okay to: °? Return to work or school. °? Resume usual physical activities or sports. °? Resume sexual activity. °General instructions °· If the catheter site starts to bleed, raise your arm and put firm pressure on the site. If the bleeding does not stop, get help right away. This is a medical emergency. °· If you went home on the same day as your procedure, a responsible adult should be with you for the first 24 hours after you arrive home. °· Keep all follow-up visits as told by your health care provider. This is important. °Contact a health care provider if: °· You have a fever. °· You have redness, swelling, or yellow drainage around your insertion site. °Get help right away if: °· You have unusual pain at the radial site. °· The catheter insertion area swells very fast. °· The insertion area is bleeding, and the bleeding does not stop when you hold steady pressure on the area. CALL 911 °· Your arm or hand becomes pale, cool, tingly, or numb. °These symptoms may represent a   serious problem that is an emergency. Do not wait to see if the symptoms will go away. Get medical help right away. Call your local emergency services (911 in the [place] Do not drive yourself to the hospital. °Summary °· After the procedure, it is common to have bruising and tenderness at the site. °· Follow instructions from your health care provider about how to take care of your radial site wound. Check the wound every day for signs of infection. °· Do not lift anything that is heavier than 10 lb (4.5 kg), or the limit that you are told, until your health care provider  says that it is safe. °This information is not intended to replace advice given to you by your health care provider. Make sure you discuss any questions you have with your health care provider. °Document Released: 07/24/2010 Document Revised: 07/27/2017 Document Reviewed: 07/27/2017 °Elsevier Interactive Patient Education © 2019 [employer] ° °

## 2018-07-14 ENCOUNTER — Institutional Professional Consult (permissible substitution): Payer: PRIVATE HEALTH INSURANCE | Admitting: Pulmonary Disease

## 2018-07-14 ENCOUNTER — Encounter (HOSPITAL_COMMUNITY): Payer: Self-pay | Admitting: Interventional Cardiology

## 2018-07-16 ENCOUNTER — Other Ambulatory Visit: Payer: Self-pay | Admitting: Family Medicine

## 2018-07-18 ENCOUNTER — Ambulatory Visit: Payer: PRIVATE HEALTH INSURANCE | Admitting: Pulmonary Disease

## 2018-07-18 ENCOUNTER — Encounter: Payer: Self-pay | Admitting: Pulmonary Disease

## 2018-07-18 VITALS — BP 146/80 | HR 94 | Ht 71.0 in | Wt 269.4 lb

## 2018-07-18 DIAGNOSIS — Z6837 Body mass index (BMI) 37.0-37.9, adult: Secondary | ICD-10-CM

## 2018-07-18 DIAGNOSIS — R0602 Shortness of breath: Secondary | ICD-10-CM

## 2018-07-18 DIAGNOSIS — G4733 Obstructive sleep apnea (adult) (pediatric): Secondary | ICD-10-CM | POA: Diagnosis not present

## 2018-07-18 DIAGNOSIS — R0609 Other forms of dyspnea: Secondary | ICD-10-CM

## 2018-07-18 DIAGNOSIS — Z9989 Dependence on other enabling machines and devices: Secondary | ICD-10-CM

## 2018-07-18 NOTE — Progress Notes (Signed)
Synopsis: Referred in Jan 2020 for SOB by Sheliah Hatch, MD  Subjective:   PATIENT ID: Jordan Mcconnell GENDER: male DOB: [birth year], MRN: [id number]  Chief Complaint  Patient presents with  . Consult    Consult for SOB. States he has had SOB for a while but it seems to be getting worse. States he is pretty much SOB all the time. States he had a heart cath last week but was cleared from a heart stand point.     PMH HTN. Obesity, OSA on CPAP. With exertion will get SOB. On occasion will have SOB and chest tightness. He feels like this has been going on for several months. Wife has noticed a significant change. And his endurance has definitely changed.  Overall he describes symptoms that have been progressively getting worse over the past year.  His job has somewhat changed.  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  He describes a couple of events in which he has exceeded his threshold of exercise tolerance causing significant dyspnea.  He was recently on vacation in the [place] and was swimming with his daughters and developed progressive shortness of breath with exertion.  He also describes events being outside and climbing hills.  He has had cardiac evaluation to include echocardiogram and heart catheterization which was normal.  He was told that he did not think there was anything wrong with his cardiac function because his dyspnea.  He does have sleep apnea and wears his CPAP religiously every night.   Past Medical History:  Diagnosis Date  . Complication of anesthesia    one surgery pt. states he was hard to wake up  . GERD (gastroesophageal reflux disease)   . HTN (hypertension)   . 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 Obesity   . OSA (obstructive sleep apnea)    uses CPAP     Family History  Problem Relation Age of  Onset  . Memory loss Mother   . Heart murmur Mother   . Colon cancer Father   . Stroke Father   . Cancer Father        colon  . Dementia Father   . Cancer Maternal Grandfather        lung and bone  . Memory loss Maternal Grandmother      Past Surgical History:  Procedure Laterality Date  . BACK SURGERY  08/2015  . COLONOSCOPY    . ESOPHAGEAL DILATION     1 or 2 times  . ESOPHAGOGASTRODUODENOSCOPY N/A 10/30/2013   Procedure: ESOPHAGOGASTRODUODENOSCOPY (EGD);  Surgeon: Charolett Bumpers, MD;  Location: [facility];  Service: Endoscopy;  Laterality: N/A;  . JOINT REPLACEMENT     bilateral knees  . LEFT HEART CATH AND CORONARY ANGIOGRAPHY N/A 07/13/2018   Procedure: LEFT HEART CATH AND CORONARY ANGIOGRAPHY;  Surgeon: Corky Crafts, MD;  Location: [facility] INVASIVE CV LAB;  Service: Cardiovascular;  Laterality: N/A;  . SHOULDER ARTHROSCOPY     Bilateral 3 times each shoulder    Social History   Socioeconomic History  . Marital status: Married    Spouse name: Not on file  . Number of children: Not on file  . Years of education: Not on file  . Highest education level: Not on file  Occupational History  . Not on file  Social Needs  . Financial resource strain: Not on file  . Food insecurity:    Worry: Not on file    Inability: Not on file  . Transportation needs:    Medical: Not on file    Non-medical: Not on file  Tobacco Use  . Smoking status: Never Smoker  . Smokeless tobacco: Never Used  Substance and Sexual Activity  . Alcohol use: No  . Drug use: No  . Sexual activity: Not on file  Lifestyle  . Physical activity:    Days per week: Not on file    Minutes per session: Not on file  . Stress: Not on file  Relationships  . Social connections:    Talks on phone: Not on file    Gets together: Not on file    Attends religious service: Not on file    Active member of club or organization: Not on file    Attends meetings of clubs or organizations: Not on file     Relationship status: Not on file  . Intimate partner violence:    Fear of current or ex partner: Not on file    Emotionally abused: Not on file    Physically abused: Not on file    Forced sexual activity: Not on file  Other Topics Concern  . Not on file  Social History Narrative  . Not on file     Allergies  Allergen Reactions  . Penicillins Rash    DID THE REACTION INVOLVE: Swelling of the face/tongue/throat, SOB, or low BP? No Sudden or severe rash/hives, skin peeling, or the inside of the mouth or nose? Yes Did it require medical treatment? Yes When did it last happen?childhood allergy If all above answers are "NO", may proceed with cephalosporin use.    . Sulfa Antibiotics Rash  . Codeine Nausea Only  . Tramadol Nausea Only    Severe dizziness     Outpatient Medications Prior to Visit  Medication Sig Dispense Refill  . hydrochlorothiazide (HYDRODIURIL) 25 MG tablet TAKE 1 TABLET BY MOUTH EVERY DAY *INS ALLOWS 30 DAYS 30 tablet 7  . metFORMIN (GLUCOPHAGE) 500 MG tablet TAKE 1 TABLET BY MOUTH TWICE A DAY WITH FOOD 60 tablet 6  . metoprolol tartrate (LOPRESSOR) 25 MG tablet Take 1 tablet (25 mg total) by mouth 2 (two) times daily. 180 tablet 3  . nitroGLYCERIN (NITROSTAT) 0.4 MG SL tablet Place 1 tablet (0.4 mg total) under the tongue every 5 (five) minutes as needed for chest pain. 90 tablet 3   No facility-administered medications prior to visit.     Review of Systems  Constitutional: Negative for chills, fever, malaise/fatigue and weight loss.  HENT: Negative for hearing loss, sore throat and tinnitus.   Eyes: Negative for blurred vision and double vision.  Respiratory: Positive for shortness of breath. Negative for cough, hemoptysis, sputum production, wheezing and stridor.   Cardiovascular: Negative for chest pain, palpitations, orthopnea, leg swelling and PND.  Gastrointestinal: Negative for abdominal pain, constipation, diarrhea, heartburn, nausea and  vomiting.  Genitourinary: Negative for dysuria, hematuria and urgency.  Musculoskeletal: Negative for joint pain and myalgias.  Skin: Negative for itching and rash.  Neurological: Negative for dizziness, tingling, weakness and headaches.  Endo/Heme/Allergies: Negative for environmental allergies. Does not bruise/bleed easily.  Psychiatric/Behavioral: Negative for depression. The patient is not nervous/anxious and does not have insomnia.   All other systems reviewed and are negative.    Objective:  Physical Exam Vitals signs reviewed.  Constitutional:  General: He is not in acute distress.    Appearance: He is well-developed.  HENT:     Head: Normocephalic and atraumatic.  Eyes:     General: No scleral icterus.    Conjunctiva/sclera: Conjunctivae normal.     Pupils: Pupils are equal, round, and reactive to light.  Neck:     Musculoskeletal: Neck supple.     Vascular: No JVD.     Trachea: No tracheal deviation.  Cardiovascular:     Rate and Rhythm: Normal rate and regular rhythm.     Heart sounds: Normal heart sounds. No murmur.  Pulmonary:     Effort: Pulmonary effort is normal. No tachypnea, accessory muscle usage or respiratory distress.     Breath sounds: Normal breath sounds. No stridor. No wheezing, rhonchi or rales.  Abdominal:     General: Bowel sounds are normal. There is no distension.     Palpations: Abdomen is soft.     Tenderness: There is no abdominal tenderness.     Comments: Obese abdomen.  Musculoskeletal:        General: No tenderness.  Lymphadenopathy:     Cervical: No cervical adenopathy.  Skin:    General: Skin is warm and dry.     Capillary Refill: Capillary refill takes less than 2 seconds.     Findings: No rash.  Neurological:     Mental Status: He is alert and oriented to person, place, and time.  Psychiatric:        Behavior: Behavior normal.      Vitals:   07/18/18 1600  BP: (!) 146/80  Pulse: 94  SpO2: 99%  Weight: 269 lb 6.4 oz  (122.2 kg)  Height: 5\' 11"  (1.803 m)   99% on RA BMI Readings from Last 3 Encounters:  07/18/18 37.57 kg/m  07/13/18 36.26 kg/m  07/11/18 38.22 kg/m   Wt Readings from Last 3 Encounters:  07/18/18 269 lb 6.4 oz (122.2 kg)  07/13/18 260 lb (117.9 kg)  07/11/18 274 lb (124.3 kg)     CBC    Component Value Date/Time   WBC 9.0 06/21/2018 1110   RBC 5.71 06/21/2018 1110   HGB 16.8 06/21/2018 1110   HCT 49.6 06/21/2018 1110   PLT 214.0 06/21/2018 1110   MCV 86.9 06/21/2018 1110   MCH 28.9 08/01/2015 0844   MCHC 34.0 06/21/2018 1110   RDW 14.0 06/21/2018 1110   LYMPHSABS 2.9 06/21/2018 1110   MONOABS 1.1 (H) 06/21/2018 1110   EOSABS 0.4 06/21/2018 1110   BASOSABS 0.1 06/21/2018 1110    Chest Imaging: Chest x-ray 06/21/2018: Poor inspiratory effort, no infiltrate no acute abnormality. The patient's images have been independently reviewed by me.    Pulmonary Functions Testing Results: No flowsheet data found.  FeNO: None   Pathology: None   Echocardiogram:  2010 - Normal ejection fraction  Heart Catheterization:  07/13/2018  Prox LAD lesion is 25% stenosed.  Mid LAD lesion is 25% stenosed.  The left ventricular systolic function is normal.  The left ventricular ejection fraction is 55-65% by visual estimate.  LV end diastolic pressure is mildly elevated.  There is no aortic valve stenosis.  Tortuous right subclavian makes catheter torquing difficult. If emergency cath was needed in the future, would consider different approach rather than right radial artery.       Assessment & Plan:   Shortness of breath - Plan: Pulmonary function test  DOE (dyspnea on exertion)  BMI 37.0-37.9, adult  OSA on CPAP  Discussion:  This is a 63 year old gentleman, obese, BMI of 37, with progressive dyspnea on exertion.  Normal cardiac evaluation, normal heart catheterization.  Lifelong non-smoker.  His dyspnea on exertion has been progressive over the past several  months to years even.  Seems predominantly related to lack of physicality.  I believe the only way to further evaluate this is to obtain pulmonary function test.  I believe with a normal cardiac evaluation and normal pulmonary function tests that he is not dealing with a cardiac or a pulmonary reason to develop dyspnea on significant exertion.  We was counseled today on weight loss.  I do think he needs to start building back his endurance is slowly.  If he is 100% that he would like to further investigate his dyspnea if his pulmonary function tests are normal.  Then we can consider cardiopulmonary exercise testing but I believe that weight loss and building of his exercise tolerance should be the first step.  I do however suspect his pulmonary function test to revealed a prism pattern in which she has a preserved ratio may be slightly impaired spirometry and a significantly reduced ERV consistent with his BMI.  Patient follow-up in 1 to 2 weeks with completion of pulmonary function test.  He can be seen with one of our nurse practitioners to review these results.  Greater than 50% of this patient 60-minute office visit was spent face-to-face discussing the above recommendations and treatment plan and reviewing current available data as described above.   Current Outpatient Medications:  .  hydrochlorothiazide (HYDRODIURIL) 25 MG tablet, TAKE 1 TABLET BY MOUTH EVERY DAY *INS ALLOWS 30 DAYS, Disp: 30 tablet, Rfl: 7 .  metFORMIN (GLUCOPHAGE) 500 MG tablet, TAKE 1 TABLET BY MOUTH TWICE A DAY WITH FOOD, Disp: 60 tablet, Rfl: 6 .  metoprolol tartrate (LOPRESSOR) 25 MG tablet, Take 1 tablet (25 mg total) by mouth 2 (two) times daily., Disp: 180 tablet, Rfl: 3 .  nitroGLYCERIN (NITROSTAT) 0.4 MG SL tablet, Place 1 tablet (0.4 mg total) under the tongue every 5 (five) minutes as needed for chest pain., Disp: 90 tablet, Rfl: 3   Josephine Igo, DO [facility] 07/18/2018 4:14 PM

## 2018-07-18 NOTE — Patient Instructions (Addendum)
Thank you for visiting Dr. Tonia Brooms at [facility]. Today we recommend the following: Orders Placed This Encounter  Procedures  . Pulmonary function test    Return in about 2 weeks (around 08/01/2018). Please schedule F/U with NP to discuss PFT.

## 2018-08-01 NOTE — Progress Notes (Signed)
Cardiology Office Note    Date:  08/02/2018   ID:  Jordan Mcconnell, DOB 09/11/1955, MRN [id number]  PCP:  Sheliah Hatchabori, Katherine E, MD  Cardiologist: Armanda Magicraci Turner, MD EPS: None  No chief complaint on file.   History of Present Illness:  Jordan Mcconnell is a 63 y.o. male  with OSA on CPAP, HTN, obesity, DM type 2. Last saw Dr. Mayford Knifeurner 03/01/18 and doing well.   Patient was having chest pain when sneezing or coughing described as sharp when he saw his PCP 06/21/2018 so was referred here for follow-up.  Also had some shortness of breath and dyspnea on exertion with little activity.  Chest x-ray that day showed no acute cardiopulmonary abnormality.  Symptoms were worrisome for angina and he underwent cardiac catheterization 07/13/2018 this revealed 25% proximal and mid LAD, normal LV function EF 55 to 65%.  Preventative therapy recommended.  Patient comes in accompanied by his wife. Disappointed he didn't have any CAD because he is still having a lot of symptoms.  Pulmonologist told him it was his weight which he disagrees with.  He is having a PFTs on Friday.  He still has chest tightness when he wakes up in the morning and with exertion.  He says sometimes his heart is racing on his walks.  He walks 45 minutes about 3 days a week.  He has not had any long distance travel recently.  He uses his CPAP religiously.     Past Medical History:  Diagnosis Date  . Complication of anesthesia    one surgery pt. states he was hard to wake up  . GERD (gastroesophageal reflux disease)   . HTN (hypertension)   . 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 Obesity   . OSA (obstructive sleep apnea)    uses CPAP    Past Surgical History:  Procedure Laterality Date  . BACK SURGERY  08/2015  . COLONOSCOPY    . ESOPHAGEAL DILATION     1 or 2 times  . ESOPHAGOGASTRODUODENOSCOPY N/A 10/30/2013   Procedure: ESOPHAGOGASTRODUODENOSCOPY (EGD);  Surgeon: Charolett BumpersMartin K Johnson, MD;  [facility];   Service: Endoscopy;  Laterality: N/A;  . JOINT REPLACEMENT     bilateral knees  . LEFT HEART CATH AND CORONARY ANGIOGRAPHY N/A 07/13/2018   Procedure: LEFT HEART CATH AND CORONARY ANGIOGRAPHY;  Surgeon: Corky CraftsVaranasi, Jayadeep S, MD;  [facility];  Service: Cardiovascular;  Laterality: N/A;  . SHOULDER ARTHROSCOPY     Bilateral 3 times each shoulder    Current Medications: Current Meds  Medication Sig  . hydrochlorothiazide (HYDRODIURIL) 25 MG tablet TAKE 1 TABLET BY MOUTH EVERY DAY *INS ALLOWS 30 DAYS  . metFORMIN (GLUCOPHAGE) 500 MG tablet TAKE 1 TABLET BY MOUTH TWICE A DAY WITH FOOD  . nitroGLYCERIN (NITROSTAT) 0.4 MG SL tablet Place 1 tablet (0.4 mg total) under the tongue every 5 (five) minutes as needed for chest pain.  . [DISCONTINUED] metoprolol tartrate (LOPRESSOR) 25 MG tablet Take 1 tablet (25 mg total) by mouth 2 (two) times daily.     Allergies:   Penicillins; Sulfa antibiotics; Codeine; and Tramadol   Social History   Socioeconomic History  . Marital status: Married    Spouse name: Not on file  . Number of children: Not on file  . Years of education: Not on file  . Highest education level: Not on file  Occupational History  . Not on file  Social Needs  . Financial resource strain: Not  on file  . Food insecurity:    Worry: Not on file    Inability: Not on file  . Transportation needs:    Medical: Not on file    Non-medical: Not on file  Tobacco Use  . Smoking status: Never Smoker  . Smokeless tobacco: Never Used  Substance and Sexual Activity  . Alcohol use: No  . Drug use: No  . Sexual activity: Not on file  Lifestyle  . Physical activity:    Days per week: Not on file    Minutes per session: Not on file  . Stress: Not on file  Relationships  . Social connections:    Talks on phone: Not on file    Gets together: Not on file    Attends religious service: Not on file    Active member of club or organization: Not on file    Attends meetings  of clubs or organizations: Not on file    Relationship status: Not on file  Other Topics Concern  . Not on file  Social History Narrative  . Not on file     Family History:  The patient's family history includes Cancer in his father and maternal grandfather; Colon cancer in his father; Dementia in his father; Heart murmur in his mother; Memory loss in his maternal grandmother and mother; Stroke in his father.   ROS:   Please see the history of present illness. Review of Systems  Constitution: Negative.  HENT: Negative.   Cardiovascular: Positive for chest pain, dyspnea on exertion and palpitations.  Respiratory: Negative.   Endocrine: Negative.   Hematologic/Lymphatic: Negative.   Musculoskeletal: Negative.   Gastrointestinal: Negative.   Genitourinary: Negative.   Neurological: Negative.        All other systems reviewed and are negative.   PHYSICAL EXAM:   VS:  BP 128/76   Pulse 67   Ht 5\' 11"  (1.803 m)   Wt 271 lb 1.9 oz (123 kg)   SpO2 94%   BMI 37.81 kg/m   Physical Exam  GEN: Obese, in no acute distress  Neck: no JVD, carotid bruits, or masses Cardiac:RRR; no murmurs, rubs, or gallops  Respiratory:  clear to auscultation bilaterally, normal work of breathing GI: soft, nontender, nondistended, + BS Ext: without cyanosis, clubbing, or edema, Good distal pulses bilaterally Neuro:  Alert and Oriented x 3 Psych: euthymic mood, full affect  Wt Readings from Last 3 Encounters:  08/02/18 271 lb 1.9 oz (123 kg)  07/18/18 269 lb 6.4 oz (122.2 kg)  07/13/18 260 lb (117.9 kg)      Studies/Labs Reviewed:   EKG:  EKG is not ordered today.    Recent Labs: 02/16/2018: ALT 30 06/21/2018: BUN 23; Creatinine, Ser 0.89; Hemoglobin 16.8; Platelets 214.0; Potassium 4.3; Sodium 139; TSH 2.93   Lipid Panel    Component Value Date/Time   CHOL 139 02/16/2018 0919   TRIG 97.0 02/16/2018 0919   HDL 37.60 (L) 02/16/2018 0919   CHOLHDL 4 02/16/2018 0919   VLDL 19.4 02/16/2018  0919   LDLCALC 82 02/16/2018 0919   LDLDIRECT 107.0 07/07/2016 1415    Additional studies/ records that were reviewed today include:   Cardiac catheterization 1/9/2020Prox LAD lesion is 25% stenosed.  Mid LAD lesion is 25% stenosed.  The left ventricular systolic function is normal.  The left ventricular ejection fraction is 55-65% by visual estimate.  LV end diastolic pressure is mildly elevated.  There is no aortic valve stenosis.  Tortuous right subclavian makes  catheter torquing difficult. If emergency cath was needed in the future, would consider different approach rather than right radial artery.   Nonobstructive CAD.  Continue preventive therapy.   Consider other sources of symptoms     ASSESSMENT:    1. Chest pain, unspecified type   2. Essential hypertension   3. Morbid obesity (HCC)   4. OSA (obstructive sleep apnea)   5. DOE (dyspnea on exertion)      PLAN:  In order of problems listed above:  Chest pain felt to be noncardiac.  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  Preventive therapy.  Patient continues to have chest pain.  Would like to stop metoprolol.  I think this is reasonable.  Heart rate was 106 when I saw him the first time.  For completeness sake will order 2-week monitor, 2D echo and d-dimer.  Follow-up with me when test complete.  Essential hypertension blood pressure controlled with HydroDIURIL  Obesity BMI is 37.  I do feel this was contributing to his overall health and could be the cause of his dyspnea on exertion.  Agree that he should work on weight loss and increase exercise tolerance.  OSA on CPAP regularly.  Having PFTs done Friday.    Medication Adjustments/Labs and Tests Ordered: Current medicines are reviewed at length with the patient today.  Concerns regarding medicines are outlined above.  Medication changes, Labs and Tests ordered today are listed in the Patient Instructions below. Patient Instructions    Medication Instructions:  Your physician has recommended you make the following change in your medication:  1.  STOP the Metoprolol   If you need a refill on your cardiac medications before your next appointment, please call your pharmacy.   Lab work: TODAY:  DDIMER  If you have labs (blood work) drawn today and your tests are completely normal, you will receive your results only by: Marland Kitchen MyChart Message (if you have MyChart) OR . A paper copy in the mail If you have any lab test that is abnormal or we need to change your treatment, we will call you to review the results.  Testing/Procedures: Your physician has requested that you have an echocardiogram. (at the same time you have the monitor put on in 1 week)   Echocardiography is a painless test that uses sound waves to create images of your heart. It provides your doctor with information about the size and shape of your heart and how well your heart's chambers and valves are working. This procedure takes approximately one hour. There are no restrictions for this procedure.  Your physician has recommended that you wear an longterm  Monitor have it put on in 1 week).   Event monitors are medical devices that record the heart's electrical activity. Doctors most often [name] these monitors to diagnose arrhythmias. Arrhythmias are problems with the speed or rhythm of the heartbeat. The monitor is a small, portable device. You can wear one while you do your normal daily activities. This is usually used to diagnose what is causing palpitations/syncope (passing out).    Follow-Up: Your physician recommends that you schedule a follow-up appointment in:  08/23/2018 ARRIVE AT 10:45 TO SEE Kinzleigh Kandler, PA-C  Any Other Special Instructions Will Be Listed Below (If Applicable).        Elson Clan, PA-C  08/02/2018 12:01 PM    [facility] [address] [place], [place], [place]  [address] Phone: [phone]; Fax: [phone]

## 2018-08-02 ENCOUNTER — Encounter (INDEPENDENT_AMBULATORY_CARE_PROVIDER_SITE_OTHER): Payer: Self-pay

## 2018-08-02 ENCOUNTER — Encounter: Payer: Self-pay | Admitting: Physician Assistant

## 2018-08-02 ENCOUNTER — Ambulatory Visit: Payer: PRIVATE HEALTH INSURANCE | Admitting: Physician Assistant

## 2018-08-02 VITALS — BP 128/76 | HR 67 | Ht 71.0 in | Wt 271.1 lb

## 2018-08-02 DIAGNOSIS — G4733 Obstructive sleep apnea (adult) (pediatric): Secondary | ICD-10-CM

## 2018-08-02 DIAGNOSIS — I10 Essential (primary) hypertension: Secondary | ICD-10-CM | POA: Diagnosis not present

## 2018-08-02 DIAGNOSIS — R0609 Other forms of dyspnea: Secondary | ICD-10-CM

## 2018-08-02 DIAGNOSIS — R079 Chest pain, unspecified: Secondary | ICD-10-CM

## 2018-08-02 LAB — D-DIMER, QUANTITATIVE: D-DIMER: 0.5 mg{FEU}/L — ABNORMAL HIGH (ref 0.00–0.49)

## 2018-08-02 NOTE — Patient Instructions (Addendum)
Medication Instructions:  Your physician has recommended you make the following change in your medication:  1.  STOP the Metoprolol   If you need a refill on your cardiac medications before your next appointment, please call your pharmacy.   Lab work: TODAY:  DDIMER  If you have labs (blood work) drawn today and your tests are completely normal, you will receive your results only by: Marland Kitchen MyChart Message (if you have MyChart) OR . A paper copy in the mail If you have any lab test that is abnormal or we need to change your treatment, we will call you to review the results.  Testing/Procedures: Your physician has requested that you have an echocardiogram. (at the same time you have the monitor put on in 1 week)   Echocardiography is a painless test that uses sound waves to create images of your heart. It provides your doctor with information about the size and shape of your heart and how well your heart's chambers and valves are working. This procedure takes approximately one hour. There are no restrictions for this procedure.  Your physician has recommended that you wear an longterm  Monitor have it put on in 1 week).   Event monitors are medical devices that record the heart's electrical activity. Doctors most often [name] these monitors to diagnose arrhythmias. Arrhythmias are problems with the speed or rhythm of the heartbeat. The monitor is a small, portable device. You can wear one while you do your normal daily activities. This is usually used to diagnose what is causing palpitations/syncope (passing out).    Follow-Up: Your physician recommends that you schedule a follow-up appointment in:  08/23/2018 ARRIVE AT 10:45 TO SEE MICHELE LENZE, PA-C  Any Other Special Instructions Will Be Listed Below (If Applicable).

## 2018-08-03 ENCOUNTER — Ambulatory Visit: Payer: PRIVATE HEALTH INSURANCE | Admitting: Pulmonary Disease

## 2018-08-03 NOTE — Progress Notes (Addendum)
 @Patient  ID: Jordan Mcconnell, male    DOB: 31-Dec-1955, 63 y.o.   MRN: [id number]  Chief Complaint  Patient presents with  . Follow-up    PFT results, shortness of breath    Referring provider: Sheliah Hatchabori, Katherine E, MD  HPI:  63 year old male referred to our office on 07/18/2018 from primary care for evaluation of progressive shortness of breath  PMH: Obesity, gerd Smoker/ Smoking History: Never smoker Maintenance: None Pt of: Dr. Tonia BroomsIcard  08/04/2018  - Visit   63 year old male never smoker presenting to our office today after completing pulmonary function test.  The patient had a pulmonary function test ordered to further evaluate progressive shortness of breath.  Patient reports that over the last year he is noticed that has become more short of breath and able to do less physical activity but this is significantly worsened over the last 3 months.  Despite his current symptoms he does still manage to walk about 45 minutes 3 times a week.  Patient reports that this is at a flat surface.  Patient has had initial evaluations by cardiology and so far has been clear.  He has a pending echocardiogram scheduled for the beginning of February/2020.  Patient completed a pulmonary function test today. 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  Patient reports that some of his family members such as grandparents passed away from asthma and 1 grandparent passed away from lung cancer but he was an extensive  smoker.    Tests:   08/04/2018-pulmonary function test-FVC 3.32 (68% predicted), postbronchodilator ratio 87, postbronchodilator FEV1 2.94 (80% predicted), patient does not have a bronchodilator response but does have mid flow reversibility, patient reports that clinically the second spirometry was easier, patient's DLCO was 34  11/03/2008-sleep study- AHI 32.9, during REM sleep 37.9 >>>Epworth score 16  Heart Catheterization:  07/13/2018  Prox LAD lesion is 25% stenosed.  Mid LAD lesion is 25% stenosed.  The left ventricular systolic function is normal.  The left ventricular ejection fraction is 55-65% by visual estimate.  LV end diastolic pressure is mildly elevated.  There is no aortic valve stenosis.  Tortuous right subclavian makes catheter torquing difficult. If emergency cath was needed in the future, would consider different approach rather than right radial artery.  FENO:  No results found for: NITRICOXIDE  PFT: No flowsheet data found.  Imaging: No results found.    Specialty Problems      Pulmonary Problems   OSA (obstructive sleep apnea)    11/03/2008-sleep study- AHI 32.9, during REM sleep 37.9 >>>Epworth score 16      Chronic cough   Dyspnea      Allergies  Allergen Reactions  . Penicillins Rash    DID THE REACTION INVOLVE: Swelling of the face/tongue/throat, SOB, or low BP? No Sudden or severe rash/hives, skin peeling, or the inside of the mouth or nose? Yes Did it require medical treatment? Yes When did it last happen?childhood allergy If all above answers are "NO", may proceed with cephalosporin use.    . Sulfa Antibiotics Rash  . Codeine Nausea  Only  . Tramadol Nausea Only    Severe dizziness    Immunization History  Administered Date(s) Administered  . Influenza Whole 05/18/2018  . Tdap 07/07/2016    Past Medical History:  Diagnosis Date  . Complication of anesthesia    one surgery pt. states he was hard to wake up  .  GERD (gastroesophageal reflux disease)   . HTN (hypertension)   . 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 Obesity   . OSA (obstructive sleep apnea)    uses CPAP    Tobacco History: Social History   Tobacco Use  Smoking Status Never Smoker  Smokeless Tobacco Never Used   Counseling given: Yes  Continue to not smoke  Outpatient Encounter Medications as of 08/04/2018  Medication Sig  . hydrochlorothiazide (HYDRODIURIL) 25 MG tablet TAKE 1 TABLET BY MOUTH EVERY DAY *INS ALLOWS 30 DAYS  . metFORMIN (GLUCOPHAGE) 500 MG tablet TAKE 1 TABLET BY MOUTH TWICE A DAY WITH FOOD  . nitroGLYCERIN (NITROSTAT) 0.4 MG SL tablet Place 1 tablet (0.4 mg total) under the tongue every 5 (five) minutes as needed for chest pain.  Marland Kitchen albuterol (PROVENTIL HFA;VENTOLIN HFA) 108 (90 Base) MCG/ACT inhaler Inhale 2 puffs into the lungs every 4 (four) hours as needed for wheezing or shortness of breath.  . [DISCONTINUED] metoprolol tartrate (LOPRESSOR) 25 MG tablet Take 1 tablet (25 mg total) by mouth 2 (two) times daily.   No facility-administered encounter medications on file as of 08/04/2018.      Review of Systems  Review of Systems  Constitutional: Positive for fatigue. Negative for activity change, chills, fever and unexpected weight change.  HENT: Negative for sinus pressure, sinus pain, sneezing and sore throat.   Eyes: Negative.   Respiratory: Positive for shortness of breath. Negative for cough and wheezing.   Cardiovascular: Negative for chest pain, palpitations and leg swelling.  Gastrointestinal: Negative for diarrhea, nausea and vomiting.  Endocrine: Negative.   Musculoskeletal: Negative.   Skin: Negative.   Neurological: Negative for dizziness and headaches.  Psychiatric/Behavioral: Negative.  Negative for dysphoric mood. The patient is not nervous/anxious.   All other systems reviewed and are negative.    Physical Exam  BP 120/74 (BP Location: Left Arm, Cuff Size: Large)    Pulse 69   Ht 5\' 11"  (1.803 m)   SpO2 96%   BMI 37.81 kg/m   Wt Readings from Last 5 Encounters:  08/02/18 271 lb 1.9 oz (123 kg)  07/18/18 269 lb 6.4 oz (122.2 kg)  07/13/18 260 lb (117.9 kg)  07/11/18 274 lb (124.3 kg)  06/21/18 269 lb 4 oz (122.1 kg)     Physical Exam  Constitutional: He is oriented to person, place, and time and well-developed, well-nourished, and in no distress. No distress.  HENT:  Head: Normocephalic and atraumatic.  Right Ear: Hearing, tympanic membrane, external ear and ear canal normal.  Left Ear: Hearing, tympanic membrane, external ear and ear canal normal.  Nose: Nose normal. Right sinus exhibits no maxillary sinus tenderness and no frontal sinus tenderness. Left sinus exhibits no maxillary sinus tenderness and no frontal sinus tenderness.  Mouth/Throat: Uvula is midline and oropharynx is clear and moist. No oropharyngeal exudate.  Mallampati 4  Eyes: Pupils are equal, round, and reactive to light.  Neck: Normal range of motion. Neck supple. No JVD present.  Cardiovascular: Normal rate, regular rhythm and normal heart sounds.  Pulmonary/Chest: Effort normal and breath sounds normal. No accessory muscle usage. No respiratory distress. He has  no decreased breath sounds. He has no wheezes. He has no rhonchi. He has no rales.  Diminished in bases, limited air movement throughout entire exam  Abdominal: Soft. Bowel sounds are normal. There is no abdominal tenderness.  Obese abdomen  Musculoskeletal: Normal range of motion.        General: No edema.  Lymphadenopathy:    He has no cervical adenopathy.  Neurological: He is alert and oriented to person, place, and time. Gait normal.  Skin: Skin is warm and dry. He is not diaphoretic. No erythema.  Psychiatric: Mood, memory, affect and judgment normal.  Nursing note and vitals reviewed.   SIX MIN WALK 08/04/2018  Supplimental Oxygen during Test? (L/min) No  Tech Comments: Pt completed all 3x laps without  desat. Lowest O2 reading was 91%. Pt stated he felt SOB and slightly dizzy and lightheaded by the end of the 3rd lap. Stated if he kept going a few more laps he would have felt more SOB and dizzy.      Lab Results:  CBC    Component Value Date/Time   WBC 9.0 06/21/2018 1110   RBC 5.71 06/21/2018 1110   HGB 16.8 06/21/2018 1110   HCT 49.6 06/21/2018 1110   PLT 214.0 06/21/2018 1110   MCV 86.9 06/21/2018 1110   MCH 28.9 08/01/2015 0844   MCHC 34.0 06/21/2018 1110   RDW 14.0 06/21/2018 1110   LYMPHSABS 2.9 06/21/2018 1110   MONOABS 1.1 (H) 06/21/2018 1110   EOSABS 0.4 06/21/2018 1110   BASOSABS 0.1 06/21/2018 1110    BMET    Component Value Date/Time   NA 139 06/21/2018 1110   K 4.3 06/21/2018 1110   CL 97 06/21/2018 1110   CO2 31 06/21/2018 1110   GLUCOSE 123 (H) 06/21/2018 1110   BUN 23 06/21/2018 1110   CREATININE 0.89 06/21/2018 1110   CALCIUM 10.2 06/21/2018 1110   GFRNONAA >60 08/01/2015 0844   GFRAA >60 08/01/2015 0844    BNP No results found for: BNP  ProBNP No results found for: PROBNP    Assessment & Plan:   I am concerned for Mr. Medlen as I fear he may have interstitial lung disease based off of his known exposure history, DLCO of 34 today on PFT, and progressive dyspnea.  We will evaluate with a high-resolution CT scan.  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 No recent CT imaging to evaluate dyspnea except for a chest x-ray that shows low lung volumes Walk in office today patient started SPO2 of 97% and desatted to 91% and was visibly winded and short of breath as well as tachycardic by completion of 3 laps  Plan: We will order high-resolution CT scan to evaluate for concern of interstitial lung disease Provided interstitial lung disease questionnaire to evaluate patient  for proper history and exposure history Follow-up in 2 weeks Complete already ordered echocardiogram We will order albuterol rescue inhaler to be used as needed for shortness of breath and wheezing as patient does have mid flow reversibility on exam today  Morbid obesity (HCC) Assessment: BMI 37.8 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

## 2018-08-04 ENCOUNTER — Ambulatory Visit: Payer: PRIVATE HEALTH INSURANCE | Admitting: Pulmonary Disease

## 2018-08-04 ENCOUNTER — Ambulatory Visit (INDEPENDENT_AMBULATORY_CARE_PROVIDER_SITE_OTHER): Payer: PRIVATE HEALTH INSURANCE | Admitting: Pulmonary Disease

## 2018-08-04 ENCOUNTER — Encounter: Payer: Self-pay | Admitting: Pulmonary Disease

## 2018-08-04 VITALS — BP 120/74 | HR 69 | Ht 71.0 in

## 2018-08-04 DIAGNOSIS — R0602 Shortness of breath: Secondary | ICD-10-CM | POA: Diagnosis not present

## 2018-08-04 DIAGNOSIS — G4733 Obstructive sleep apnea (adult) (pediatric): Secondary | ICD-10-CM | POA: Diagnosis not present

## 2018-08-04 MED ORDER — ALBUTEROL SULFATE HFA 108 (90 BASE) MCG/ACT IN AERS: 2.0000 | INHALATION_SPRAY | RESPIRATORY_TRACT | 6 refills | Status: DC | PRN

## 2018-08-04 NOTE — Assessment & Plan Note (Signed)
Plan:  Continue CPAP use daily

## 2018-08-04 NOTE — Assessment & Plan Note (Addendum)
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 No recent CT imaging to evaluate dyspnea except for a chest x-ray that shows low lung volumes Walk in office today patient started SPO2 of 97% and desatted to 91% and was visibly winded and short of breath as well as tachycardic by completion of 3 laps  Plan: We will order high-resolution CT scan to evaluate for concern of interstitial lung disease Provided interstitial lung disease questionnaire to evaluate patient for proper history and exposure history Follow-up in 2 weeks Complete already ordered echocardiogram We will order albuterol rescue inhaler to be used as needed for shortness of breath and wheezing as patient does have mid flow reversibility on exam today

## 2018-08-04 NOTE — Assessment & Plan Note (Signed)
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

## 2018-08-04 NOTE — Patient Instructions (Addendum)
We will order high-resolution CT scan to be completed in the next 2 weeks  Please complete the interstitial lung disease questionnaire provided today  Only use your albuterol as a rescue medication to be used if you can't catch your breath by resting or doing a relaxed purse lip breathing pattern.  - The less you use it, the better it will work when you need it. - Ok to use up to 2 puffs  every 4 hours if you must but call for immediate appointment if use goes up over your usual need - Don't leave home without it !!  (think of it like the spare tire for your car)    Follow-up with Dr. Tonia BroomsIcard or if no availability then Elisha HeadlandBrian Mack in 2 weeks  It is flu season:   >>>Remember to be washing your hands regularly, using hand sanitizer, be careful to use around herself with has contact with people who are sick will increase her chances of getting sick yourself. >>> Best ways to protect herself from the flu: Receive the yearly flu vaccine, practice good hand hygiene washing with soap and also using hand sanitizer when available, eat a nutritious meals, get adequate rest, hydrate appropriately   Please contact the office if your symptoms worsen or you have concerns that you are not improving.   Thank you for choosing [facility] for your healthcare, and for allowing us to partner with you on your healthcare journey. I am thankful to be able to provide care to you today.   Elisha HeadlandBrian Mack FNP-C

## 2018-08-04 NOTE — Progress Notes (Signed)
PFT completed today.Katie Welchel,CMA  

## 2018-08-07 NOTE — Progress Notes (Signed)
PCCM: Agree. Thanks for seeing him.  Josephine Igo, DO [facility] 08/07/2018 8:47 AM

## 2018-08-10 ENCOUNTER — Ambulatory Visit (HOSPITAL_COMMUNITY): Payer: PRIVATE HEALTH INSURANCE | Attending: Cardiovascular Disease

## 2018-08-10 ENCOUNTER — Ambulatory Visit: Payer: PRIVATE HEALTH INSURANCE

## 2018-08-10 DIAGNOSIS — R079 Chest pain, unspecified: Secondary | ICD-10-CM | POA: Insufficient documentation

## 2018-08-10 DIAGNOSIS — I10 Essential (primary) hypertension: Secondary | ICD-10-CM | POA: Insufficient documentation

## 2018-08-10 DIAGNOSIS — G4733 Obstructive sleep apnea (adult) (pediatric): Secondary | ICD-10-CM | POA: Insufficient documentation

## 2018-08-11 LAB — PULMONARY FUNCTION TEST
DL/VA % pred: 146 %
DL/VA: 6.83 ml/min/mmHg/L
DLCO cor % pred: 32 %
DLCO cor: 11 ml/min/mmHg
DLCO unc % pred: 34 %
DLCO unc: 11.63 ml/min/mmHg
FEF 25-75 Post: 4.11 L/sec
FEF 25-75 Pre: 3.07 L/sec
FEF2575-%Change-Post: 34 %
FEF2575-%PRED-POST: 138 %
FEF2575-%Pred-Pre: 103 %
FEV1-%Change-Post: 7 %
FEV1-%Pred-Post: 80 %
FEV1-%Pred-Pre: 74 %
FEV1-Post: 2.94 L
FEV1-Pre: 2.73 L
FEV1FVC-%Change-Post: 6 %
FEV1FVC-%Pred-Pre: 109 %
FEV6-%Change-Post: 1 %
FEV6-%Pred-Post: 72 %
FEV6-%Pred-Pre: 71 %
FEV6-Post: 3.38 L
FEV6-Pre: 3.32 L
FEV6FVC-%Change-Post: 0 %
FEV6FVC-%PRED-PRE: 104 %
FEV6FVC-%Pred-Post: 104 %
FVC-%Change-Post: 1 %
FVC-%Pred-Post: 69 %
FVC-%Pred-Pre: 68 %
FVC-Post: 3.38 L
FVC-Pre: 3.32 L
POST FEV1/FVC RATIO: 87 %
Post FEV6/FVC ratio: 100 %
Pre FEV1/FVC ratio: 82 %
Pre FEV6/FVC Ratio: 100 %
RV % PRED: 110 %
RV: 2.58 L
TLC % pred: 77 %
TLC: 5.59 L

## 2018-08-14 ENCOUNTER — Telehealth: Payer: Self-pay | Admitting: Interventional Cardiology

## 2018-08-14 ENCOUNTER — Other Ambulatory Visit: Payer: Self-pay | Admitting: Physician Assistant

## 2018-08-14 ENCOUNTER — Ambulatory Visit (INDEPENDENT_AMBULATORY_CARE_PROVIDER_SITE_OTHER): Payer: PRIVATE HEALTH INSURANCE

## 2018-08-14 DIAGNOSIS — I10 Essential (primary) hypertension: Secondary | ICD-10-CM

## 2018-08-14 DIAGNOSIS — R000 Tachycardia, unspecified: Secondary | ICD-10-CM

## 2018-08-14 DIAGNOSIS — R002 Palpitations: Secondary | ICD-10-CM

## 2018-08-14 DIAGNOSIS — R079 Chest pain, unspecified: Secondary | ICD-10-CM

## 2018-08-14 DIAGNOSIS — G4733 Obstructive sleep apnea (adult) (pediatric): Secondary | ICD-10-CM | POA: Diagnosis not present

## 2018-08-14 NOTE — Telephone Encounter (Signed)
New Message     1. Is this related to a heart monitor you are wearing?  (If the patient says no, please ask     if they are caling about ICD/pacemaker.) Yes  2. What is your issue?? All but one lead came off on the patients heart monitor, he has it taped down but wants to know what else he should do. (If the patient is calling for results of the heart monitor this     message should be sent to nurse.)   Please route to covering RN/CMA/RMA for results. Route to monitor technicians or your monitor tech representative for your site for any technical concerns

## 2018-08-14 NOTE — Telephone Encounter (Signed)
ZIO patch monitor fell off after 2 days.  Patient has tried to tape it without success.  Patient schedule to come in today at 4:30 PM for application of a new ZIO patch.

## 2018-08-16 ENCOUNTER — Telehealth: Payer: Self-pay | Admitting: Pulmonary Disease

## 2018-08-16 ENCOUNTER — Telehealth: Payer: Self-pay | Admitting: Interventional Cardiology

## 2018-08-16 NOTE — Telephone Encounter (Signed)
[facility] can you help with rescheduling this?

## 2018-08-16 NOTE — Telephone Encounter (Signed)
Spoke with TP about this- TP states that the heart monitor will not affect the CT scan, but the pt should contact his cardiologist to make sure that his machine is ok to go through a CT.  Spoke with pt, aware of TP's recs.  Nothing further needed at this time- will close encounter.

## 2018-08-16 NOTE — Telephone Encounter (Signed)
Spoke with patient.  Cannot remove monitor for CT and it does have metal in it.  Spoke with staff in CT.  They will call patient to reschedule CT for on or after 08/28/18.

## 2018-08-16 NOTE — Telephone Encounter (Signed)
°  1. Is this related to a heart monitor you are wearing? Yes  (If the patient says no, please ask     if they are caling about ICD/pacemaker.)   2. What is your issue??  Patient has long term monitor on, he has a CT scan scheduled for tomorrow 2/13.  He is wondering if it will be ok to have the CT scan done or will he have to reschedule it.    (If the patient is calling for results of the heart monitor this     message should be sent to nurse.)   Please route to covering RN/CMA/RMA for results. Route to monitor technicians or your monitor tech representative for your site for any technical concerns

## 2018-08-16 NOTE — Telephone Encounter (Signed)
Jordan Mcconnell, [place]       08/16/18 10:10 AM  Note    Spoke with TP about this- TP states that the heart monitor will not affect the CT scan, but the pt should contact his cardiologist to make sure that his machine is ok to go through a CT.  Spoke with pt, aware of TP's recs.  Nothing further needed at this time- will close encounter.       _____________________________________________________________________________________  Jordan Mcconnell and spoke with patient, he stated that he is currently wearing a heart monitor that was placed on 08/14/18 and is to stay on for 14 days. Patient has to keep it on for it to be a full test. Patient spoke with the cardiologist and they stated that the heart monitor cannot be taken off and placed back on so he would need to reschedule the CT scan. Patient stated that the monitor will need to stay on until 08/28/18. Patient is requesting to have his CT reschedule and the OV as well. Will route this over to [facility]. Please advise, thank you.

## 2018-08-16 NOTE — Telephone Encounter (Signed)
lmam for the patient told him he could call [phone] to be sure its scheduled on a day that works for him. And if he has any questions to call our office.

## 2018-08-16 NOTE — Telephone Encounter (Signed)
PCCM: Its ok to reschedule the CT until after the holter monitor test. Unless cardiology believes it ok to remain during the CT.  Josephine Igo, DO [facility] 08/16/2018 3:10 PM

## 2018-08-17 ENCOUNTER — Inpatient Hospital Stay: Admission: RE | Admit: 2018-08-17 | Payer: PRIVATE HEALTH INSURANCE | Source: Ambulatory Visit

## 2018-08-18 NOTE — Telephone Encounter (Signed)
Spoke with pt, he has an appt for a CT scan on 08/24/2018. Nothing further is needed.

## 2018-08-22 ENCOUNTER — Ambulatory Visit: Payer: PRIVATE HEALTH INSURANCE | Admitting: Pulmonary Disease

## 2018-08-23 ENCOUNTER — Ambulatory Visit: Payer: PRIVATE HEALTH INSURANCE | Admitting: Physician Assistant

## 2018-08-24 ENCOUNTER — Ambulatory Visit (INDEPENDENT_AMBULATORY_CARE_PROVIDER_SITE_OTHER)
Admission: RE | Admit: 2018-08-24 | Discharge: 2018-08-24 | Disposition: A | Discharge status: Home / Self Care | Payer: PRIVATE HEALTH INSURANCE | Source: Ambulatory Visit | Attending: Pulmonary Disease | Admitting: Pulmonary Disease

## 2018-08-24 DIAGNOSIS — R0602 Shortness of breath: Secondary | ICD-10-CM | POA: Diagnosis not present

## 2018-08-25 NOTE — Progress Notes (Signed)
We reviewed your high-resolution CT results.  There is no findings for interstitial lung disease.  This is good news.  There are scattered small pulmonary nodules.  You are moderately low risk as you are never smoker but do have an elevated risk based off of your work experience around [employer] in enclosed areas.  We can consider following this in 1 year with another CT scan.  Keep your follow-up with Dr. Tonia Brooms.  We can review the CT results in more detail at that time.  At the office visit with Dr. Tonia Brooms we can decide if we want to follow the face with another CT to be completed in February/2021.  No new changes to plan of care.  Elisha Headland, FNP

## 2018-08-25 NOTE — Progress Notes (Signed)
Patient that you are seen on 09/14/2018.  High resolution CT does not show interstitial lung disease.  Patient is a never smoker.  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  With a reduced DLCO on PFTs as well as restriction likely due to weight.  Can decide at your office visit if you want to follow this in 1 year with another CT as he does have the scattered pulmonary nodules.  Jordan Mcconnell

## 2018-09-01 ENCOUNTER — Telehealth: Payer: Self-pay

## 2018-09-01 NOTE — Telephone Encounter (Signed)
He does not need to keep the appoint with Wynell Balloon.  Please have him call us them and let us know what the pulmonologist says.

## 2018-09-01 NOTE — Telephone Encounter (Signed)
-----   Message from Quintella Reichert, MD sent at 08/31/2018 12:30 PM EST ----- Please let patient know that heart monitor showed normal sinus rhythm with average heart rate 75 bpm.  He did have a couple extra heartbeats from the.bottom and top of his heart.  These are benign.  If he is asymptomatic would not treat these.  If he is complaining of palpitations then could start him on Toprol-XL 25 mg daily.

## 2018-09-01 NOTE — Telephone Encounter (Signed)
Spoke with the patient, he stated that he has not felt palpitations. His main concern his SOB. He is following up with Pulmonology on 3/12. He asked if he needed to keep his appointment with Jacolyn Reedy PA-C, since his cardiac results came back normal.

## 2018-09-01 NOTE — Telephone Encounter (Signed)
Spoke with the patient's wife, she stated they would keep the appointment just in case her pulmonologist needs him to follow up. She will call after the appointment and let us know if we can cancel.

## 2018-09-13 NOTE — Progress Notes (Deleted)
Cardiology Office Note    Date:  09/13/2018   ID:  Jordan, Mcconnell 1955-10-31, MRN [id number]  PCP:  Sheliah Hatch, MD  Cardiologist: Armanda Magic, MD EPS: None  No chief complaint on file.   History of Present Illness:  Jordan Mcconnell is a 63 y.o. male ***    Past Medical History:  Diagnosis Date  . Complication of anesthesia    one surgery pt. states he was hard to wake up  . GERD (gastroesophageal reflux disease)   . HTN (hypertension)   . 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 Obesity   . OSA (obstructive sleep apnea)    uses CPAP    Past Surgical History:  Procedure Laterality Date  . BACK SURGERY  08/2015  . COLONOSCOPY    . ESOPHAGEAL DILATION     1 or 2 times  . ESOPHAGOGASTRODUODENOSCOPY N/A 10/30/2013   Procedure: ESOPHAGOGASTRODUODENOSCOPY (EGD);  Surgeon: Charolett Bumpers, MD;  Location: [facility];  Service: Endoscopy;  Laterality: N/A;  . JOINT REPLACEMENT     bilateral knees  . LEFT HEART CATH AND CORONARY ANGIOGRAPHY N/A 07/13/2018   Procedure: LEFT HEART CATH AND CORONARY ANGIOGRAPHY;  Surgeon: Corky Crafts, MD;  Location: [facility] INVASIVE CV LAB;  Service: Cardiovascular;  Laterality: N/A;  . SHOULDER ARTHROSCOPY     Bilateral 3 times each shoulder    Current Medications: No outpatient medications have been marked as taking for the 09/19/18 encounter (Appointment) with Dyann Kief, PA-C.     Allergies:   Penicillins; Sulfa antibiotics; Codeine; and Tramadol   Social History   Socioeconomic History  . Marital status: Married    Spouse name: Not on file  . Number of children: Not on file  . Years of education: Not on file  . Highest education level: Not on file  Occupational History  . Not on file  Social Needs  . Financial resource strain: Not on file  . Food insecurity:    Worry: Not on file    Inability: Not on file  . Transportation needs:    Medical: Not on file    Non-medical: Not on file   Tobacco Use  . Smoking status: Never Smoker  . Smokeless tobacco: Never Used  Substance and Sexual Activity  . Alcohol use: No  . Drug use: No  . Sexual activity: Not on file  Lifestyle  . Physical activity:    Days per week: Not on file    Minutes per session: Not on file  . Stress: Not on file  Relationships  . Social connections:    Talks on phone: Not on file    Gets together: Not on file    Attends religious service: Not on file    Active member of club or organization: Not on file    Attends meetings of clubs or organizations: Not on file    Relationship status: Not on file  Other Topics Concern  . Not on file  Social History Narrative  . Not on file     Family History:  The patient's ***family history includes Cancer in his father and maternal grandfather; Colon cancer in his father; Dementia in his father; Heart murmur in his mother; Memory loss in his maternal grandmother and mother; Stroke in his father.   ROS:   Please see the history of present illness.    ROS All other systems reviewed and are negative.   PHYSICAL EXAM:   VS:  There were no vitals taken for this visit.  Physical Exam  GEN: Well nourished, well developed, in no acute distress  HEENT: normal  Neck: no JVD, carotid bruits, or masses Cardiac:RRR; no murmurs, rubs, or gallops  Respiratory:  clear to auscultation bilaterally, normal work of breathing GI: soft, nontender, nondistended, + BS Ext: without cyanosis, clubbing, or edema, Good distal pulses bilaterally MS: no deformity or atrophy  Skin: warm and dry, no rash Neuro:  Alert and Oriented x 3, Strength and sensation are intact Psych: euthymic mood, full affect  Wt Readings from Last 3 Encounters:  08/02/18 271 lb 1.9 oz (123 kg)  07/18/18 269 lb 6.4 oz (122.2 kg)  07/13/18 260 lb (117.9 kg)      Studies/Labs Reviewed:   EKG:  EKG is*** ordered today.  The ekg ordered today demonstrates ***  Recent Labs: 02/16/2018: ALT 30  06/21/2018: BUN 23; Creatinine, Ser 0.89; Hemoglobin 16.8; Platelets 214.0; Potassium 4.3; Sodium 139; TSH 2.93   Lipid Panel    Component Value Date/Time   CHOL 139 02/16/2018 0919   TRIG 97.0 02/16/2018 0919   HDL 37.60 (L) 02/16/2018 0919   CHOLHDL 4 02/16/2018 0919   VLDL 19.4 02/16/2018 0919   LDLCALC 82 02/16/2018 0919   LDLDIRECT 107.0 07/07/2016 1415    Additional studies/ records that were reviewed today include:  ***    ASSESSMENT:    No diagnosis found.   PLAN:  In order of problems listed above:      Medication Adjustments/Labs and Tests Ordered: Current medicines are reviewed at length with the patient today.  Concerns regarding medicines are outlined above.  Medication changes, Labs and Tests ordered today are listed in the Patient Instructions below. There are no Patient Instructions on file for this visit.   Signed, Jacolyn Reedy, PA-C  09/13/2018 3:10 PM    [facility] [address] [place], [place], [place]  [address] Phone: [phone]; Fax: [phone]

## 2018-09-14 ENCOUNTER — Ambulatory Visit: Payer: PRIVATE HEALTH INSURANCE | Admitting: Pulmonary Disease

## 2018-09-17 NOTE — Progress Notes (Signed)
@Patient  ID: Jordan Mcconnell, male    DOB: 1955/10/22, 63 y.o.   MRN: [id number]  Chief Complaint  Patient presents with   Follow-up    Breathing is unchanged.     Referring provider: Sheliah Hatch, MD  HPI:  63 year old male referred to our office on 07/18/2018 from primary care for evaluation of progressive shortness of breath  PMH: Obesity, gerd Smoker/ Smoking History: Never smoker Maintenance: None Pt of: Dr. Tonia Brooms  09/18/2018  - Visit   63 year old male never smoker presenting to our office for a two-week follow-up visit.  Since last office visit patient did complete a high-resolution CT scan.  CT scan results are listed below:  08/24/2018-CT chest high resolution-no findings to suggest interstitial lung disease, mild air trapping indicative of small airways disease, multiple small 3 to 5 mm pulmonary nodules in lungs bilaterally nonspecific but statistically likely benign no follow-up needed if patient is low risk    Patient also has completed cardiology follow-up which included an echocardiogram that showed normal systolic function as well as a Holter monitor which the patient reports was normal.  Patient reports that cardiology contacted him and told him he did not need a follow-up visit with them. Cardiology work-up results listed below:  08/10/2018-echocardiogram- LV ejection fraction 55 to 60%  08/14/2018-long-term monitor hookup and interpretation- normal sinus rhythm with average heart rate of 75 bpm heart rate ranged from 45 to 140 bpm, occasional PACs and nonsustained atrial tachycardia up to 12 beats at 138 bpm, occasional PVCs. >>> Cardiology recommended starting patient on metoprolol XL 25 mg daily if patient is symptomatic     Patient continues to report that he has had a few episodes of shortness of breath with wheezing with physical exertion.  So far patient's cardiac as well as pulmonary work-up has not revealed what could be causing the shortness of  breath.  Patient reports he continues to be physically active such as walking multiple times a week with wife walking a mile to mile and a half each time.  Patient's pulmonary function test did review a reduced DLCO.  At last office visit I did provide the patient with the ILD questionnaire.  I have reviewed his ILD questionnaire which he brought today.  ILD questionnaire reveals that patient has had trouble swallowing for the last 10 years he has not been followed by GI and some years with this.  Patient believes he saw Dr. Leone Payor and did have esophageal stretching 2 times.  Patient continues to be adherent to using his CPAP.  Only family history was asthma with his grandmother.  His grandfather did have lung cancer but he was a heavy smoker and also worked in a male.  Patient is a never smoker.  Occupational history reveals asbestos exposure.  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   Tests:   08/04/2018-pulmonary function test-FVC 3.32 (68% predicted), postbronchodilator ratio 87, postbronchodilator FEV1 2.94 (80% predicted), patient does not have a bronchodilator response but does have mid flow reversibility, patient reports that clinically the second spirometry was easier, patient's DLCO was 34  11/03/2008-sleep study- AHI 32.9, during REM sleep 37.9 >>>Epworth score 16  Heart Catheterization:  07/13/2018  Prox LAD lesion is 25% stenosed.  Mid LAD lesion is 25% stenosed.  The left ventricular systolic function is normal.  The left ventricular ejection fraction is 55-65% by visual estimate.  LV end diastolic pressure is mildly elevated.  There is no aortic valve stenosis.  Tortuous right subclavian makes catheter torquing difficult. If emergency cath was needed in the future, would consider different approach rather than right radial artery.  08/24/2018-CT chest high resolution-no findings to suggest interstitial lung disease, mild air trapping indicative of small airways  disease, multiple small 3 to 5 mm pulmonary nodules in lungs bilaterally nonspecific but statistically likely benign no follow-up needed if patient is low risk  08/10/2018-echocardiogram- LV ejection fraction 55 to 60%  08/14/2018-long-term monitor hookup and interpretation- normal sinus rhythm with average heart rate of 75 bpm heart rate ranged from 45 to 140 bpm, occasional PACs and nonsustained atrial tachycardia up to 12 beats at 138 bpm, occasional PVCs. >>> Cardiology recommended starting patient on metoprolol XL 25 mg daily if patient is symptomatic  FENO:  No results found for: NITRICOXIDE  PFT: PFT Results Latest Ref Rng & Units 08/04/2018  FVC-Pre L 3.32  FVC-Predicted Pre % 68  FVC-Post L 3.38  FVC-Predicted Post % 69  Pre FEV1/FVC % % 82  Post FEV1/FCV % % 87  FEV1-Pre L 2.73  FEV1-Predicted Pre % 74  FEV1-Post L 2.94  DLCO UNC% % 34  DLCO COR %Predicted % 146  TLC L 5.59  TLC % Predicted % 77  RV % Predicted % 110    Imaging: Ct Chest High Resolution  Result Date: 08/24/2018 CLINICAL DATA:  63 year old male with history of shortness of breath and dyspnea on exertion, worsening over the past year. EXAM: CT CHEST WITHOUT CONTRAST TECHNIQUE: Multidetector CT imaging of the chest was performed following the standard protocol without intravenous contrast. High resolution imaging of the lungs, as well as inspiratory and expiratory imaging, was performed. COMPARISON:  No priors. FINDINGS: Cardiovascular: Heart size is normal. There is no significant pericardial fluid, thickening or pericardial calcification. There is aortic atherosclerosis, as well as atherosclerosis of the great vessels of the mediastinum and the coronary arteries, including calcified atherosclerotic plaque in the left anterior descending and left circumflex coronary arteries. Mediastinum/Nodes: No pathologically enlarged mediastinal or hilar lymph nodes. Please note that accurate exclusion of hilar adenopathy is  limited on noncontrast CT scans. Esophagus is unremarkable in appearance. No axillary lymphadenopathy. Lungs/Pleura: High-resolution images demonstrate no significant regions of ground-glass attenuation, subpleural reticulation, parenchymal banding, traction bronchiectasis or frank honeycombing. Inspiratory and expiratory imaging demonstrates very mild air trapping, indicative of mild small airways disease. No acute consolidative airspace disease. No pleural effusions. Several tiny 3-5 mm pulmonary nodules scattered throughout both lungs, nonspecific. Upper Abdomen: Diffuse low attenuation throughout the visualized hepatic parenchyma, indicative of hepatic steatosis. Musculoskeletal: There are no aggressive appearing lytic or blastic lesions noted in the visualized portions of the skeleton. IMPRESSION: 1. No findings to suggest interstitial lung disease. 2. Mild air trapping, indicative of mild small airways disease. 3. Aortic atherosclerosis, in addition to 2 vessel coronary artery disease. Please note that although the presence of coronary artery calcium documents the presence of coronary artery disease, the severity of this disease and any potential stenosis cannot be assessed on this non-gated CT examination. Assessment for potential risk factor modification, dietary therapy or pharmacologic therapy may be warranted, if clinically indicated. 4. Multiple small 3-5 mm pulmonary nodules in the lungs bilaterally, nonspecific, but statistically likely benign. No follow-up needed if patient is low-risk (and has no known or suspected primary neoplasm). Non-contrast chest CT can be considered in 12 months if patient is high-risk. This recommendation follows the consensus statement: Guidelines for Management of Incidental Pulmonary Nodules Detected on CT Images: From the  Fleischner Society 2017; Radiology 2017; [id number]. 5. Hepatic steatosis. Aortic Atherosclerosis (ICD10-I70.0). Electronically Signed   By: Trudie Reed M.D.   On: 08/24/2018 14:41      Specialty Problems      Pulmonary Problems   OSA (obstructive sleep apnea)    11/03/2008-sleep study- AHI 32.9, during REM sleep 37.9 >>>Epworth score 16      Chronic cough   Dyspnea      Allergies  Allergen Reactions   Penicillins Rash    DID THE REACTION INVOLVE: Swelling of the face/tongue/throat, SOB, or low BP? No Sudden or severe rash/hives, skin peeling, or the inside of the mouth or nose? Yes Did it require medical treatment? Yes When did it last happen?childhood allergy If all above answers are NO, may proceed with cephalosporin use.     Sulfa Antibiotics Rash   Codeine Nausea Only   Tramadol Nausea Only    Severe dizziness    Immunization History  Administered Date(s) Administered   Influenza Whole 05/18/2018   Influenza,inj,Quad PF,6+ Mos 04/27/2018   Tdap 07/07/2016    Past Medical History:  Diagnosis Date   Complication of anesthesia    one surgery pt. states he was hard to wake up   GERD (gastroesophageal reflux disease)    HTN (hypertension)    Irritable bowel syndrome with diarrhea 05/12/2016   Obesity    OSA (obstructive sleep apnea)    uses CPAP    Tobacco History: Social History   Tobacco Use  Smoking Status Never Smoker  Smokeless Tobacco Never Used   Counseling given: Not Answered  Continue to not smoke  Outpatient Encounter Medications as of 09/18/2018  Medication Sig   albuterol (PROVENTIL HFA;VENTOLIN HFA) 108 (90 Base) MCG/ACT inhaler Inhale 2 puffs into the lungs every 4 (four) hours as needed for wheezing or shortness of breath.   hydrochlorothiazide (HYDRODIURIL) 25 MG tablet TAKE 1 TABLET BY MOUTH EVERY DAY *INS ALLOWS 30 DAYS   metFORMIN (GLUCOPHAGE) 500 MG tablet TAKE 1 TABLET BY MOUTH TWICE A DAY WITH FOOD   nitroGLYCERIN (NITROSTAT) 0.4 MG SL tablet Place 1 tablet (0.4 mg total) under the tongue every 5 (five) minutes as needed for chest pain.    mometasone-formoterol (DULERA) 100-5 MCG/ACT AERO Inhale 2 puffs into the lungs 2 (two) times daily.   No facility-administered encounter medications on file as of 09/18/2018.      Review of Systems  Review of Systems  Constitutional: Positive for fatigue. Negative for activity change, chills, fever and unexpected weight change.  HENT: Negative for postnasal drip, rhinorrhea, sneezing and sore throat.   Eyes: Negative.   Respiratory: Positive for chest tightness (with activity ) and shortness of breath (with activity ). Negative for cough and wheezing.   Cardiovascular: Negative for chest pain and palpitations.  Gastrointestinal: Negative for diarrhea, nausea and vomiting.  Endocrine: Negative.   Musculoskeletal: Negative.   Skin: Negative.   Neurological: Negative for dizziness and headaches.  Psychiatric/Behavioral: Negative.  Negative for dysphoric mood. The patient is not nervous/anxious.   All other systems reviewed and are negative.    Physical Exam  BP 130/84 (BP Location: Left Arm, Cuff Size: Normal)    Pulse 76    Ht  (1.803 m)    Wt 267 lb (121.1 kg)    SpO2 96%    BMI 37.24 kg/m   Wt Readings from Last 5 Encounters:  09/18/18 267 lb (121.1 kg)  08/02/18 271 lb 1.9 oz (123 kg)  07/18/18  269 lb 6.4 oz (122.2 kg)  07/13/18 260 lb (117.9 kg)  07/11/18 274 lb (124.3 kg)    Physical Exam  Constitutional: He is oriented to person, place, and time and well-developed, well-nourished, and in no distress. No distress.  Obese adult male  HENT:  Head: Normocephalic and atraumatic.  Right Ear: Hearing, tympanic membrane, external ear and ear canal normal.  Left Ear: Hearing, tympanic membrane, external ear and ear canal normal.  Nose: Nose normal. Right sinus exhibits no maxillary sinus tenderness and no frontal sinus tenderness. Left sinus exhibits no maxillary sinus tenderness and no frontal sinus tenderness.  Mouth/Throat: Uvula is midline and oropharynx is clear and  moist. No oropharyngeal exudate.  Mallampati 4  Eyes: Pupils are equal, round, and reactive to light.  Neck: Normal range of motion. Neck supple.  Cardiovascular: Normal rate, regular rhythm and normal heart sounds.  Pulmonary/Chest: Effort normal and breath sounds normal. No accessory muscle usage. No respiratory distress. He has no decreased breath sounds. He has no wheezes. He has no rhonchi.  Musculoskeletal: Normal range of motion.        General: No edema.  Lymphadenopathy:    He has no cervical adenopathy.  Neurological: He is alert and oriented to person, place, and time. Gait normal.  Skin: Skin is warm and dry. He is not diaphoretic. No erythema.  Psychiatric: Mood, memory, affect and judgment normal.  Nursing note and vitals reviewed.     Lab Results:  CBC    Component Value Date/Time   WBC 9.0 06/21/2018 1110   RBC 5.71 06/21/2018 1110   HGB 16.8 06/21/2018 1110   HCT 49.6 06/21/2018 1110   PLT 214.0 06/21/2018 1110   MCV 86.9 06/21/2018 1110   MCH 28.9 08/01/2015 0844   MCHC 34.0 06/21/2018 1110   RDW 14.0 06/21/2018 1110   LYMPHSABS 2.9 06/21/2018 1110   MONOABS 1.1 (H) 06/21/2018 1110   EOSABS 0.4 06/21/2018 1110   BASOSABS 0.1 06/21/2018 1110    BMET    Component Value Date/Time   NA 139 06/21/2018 1110   K 4.3 06/21/2018 1110   CL 97 06/21/2018 1110   CO2 31 06/21/2018 1110   GLUCOSE 123 (H) 06/21/2018 1110   BUN 23 06/21/2018 1110   CREATININE 0.89 06/21/2018 1110   CALCIUM 10.2 06/21/2018 1110   GFRNONAA >60 08/01/2015 0844   GFRAA >60 08/01/2015 0844    BNP No results found for: BNP  ProBNP No results found for: PROBNP    Assessment & Plan:     OSA (obstructive sleep apnea) Assessment: Mallampati 4 May/2010 sleep study showed an AHI of 32.9  Plan: Continue CPAP use daily  Morbid obesity (HCC) Assessment: BMI 37.24 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  Plan: Actively work to lose weight Try to lose 15 to 20% of your  current body weight for management of obstructive sleep apnea  Dyspnea Assessment: 63 year old male never smoker BMI 37.24 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 Reduced DLCO on PFT 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 High-resolution CT shows no ILD, air trapping indicative of small airways disease Echocardiogram shows normal LV ejection fraction Holter monitor shows normal sinus rhythm with PVCs and PACs   Plan: Trial of Dulera 100 Follow-up in 3 months If patient's symptoms begin to worsen, could consider trial of metoprolol 25 mg based off of cardiology assessment of Holter monitor  Dysphagia Plan: Patient to contact lobe our GI, telephone number provided today to schedule follow-up  I do not believe the patient will need a referral for this, if the patient calls back and needs a referral I am fine signing for     Coral Ceo, NP 09/18/2018   This appointment was 30 min long with over 50% of the time in direct face-to-face patient care, assessment, plan of care, and follow-up.

## 2018-09-18 ENCOUNTER — Other Ambulatory Visit: Payer: Self-pay

## 2018-09-18 ENCOUNTER — Ambulatory Visit: Payer: PRIVATE HEALTH INSURANCE | Admitting: Pulmonary Disease

## 2018-09-18 ENCOUNTER — Encounter: Payer: Self-pay | Admitting: Pulmonary Disease

## 2018-09-18 VITALS — BP 130/84 | HR 76 | Ht 71.0 in | Wt 267.0 lb

## 2018-09-18 DIAGNOSIS — R1310 Dysphagia, unspecified: Secondary | ICD-10-CM | POA: Diagnosis not present

## 2018-09-18 DIAGNOSIS — G4733 Obstructive sleep apnea (adult) (pediatric): Secondary | ICD-10-CM

## 2018-09-18 DIAGNOSIS — R918 Other nonspecific abnormal finding of lung field: Secondary | ICD-10-CM | POA: Insufficient documentation

## 2018-09-18 DIAGNOSIS — R0609 Other forms of dyspnea: Secondary | ICD-10-CM

## 2018-09-18 MED ORDER — MOMETASONE FURO-FORMOTEROL FUM 100-5 MCG/ACT IN AERO: 2.0000 | INHALATION_SPRAY | Freq: Two times a day (BID) | RESPIRATORY_TRACT | 0 refills | Status: DC

## 2018-09-18 NOTE — Progress Notes (Signed)
PCCM: Agree thanks for seeing him Josephine Igo, DO [facility] 09/18/2018 3:22 PM

## 2018-09-18 NOTE — Assessment & Plan Note (Signed)
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

## 2018-09-18 NOTE — Assessment & Plan Note (Addendum)
Plan: Patient to contact [facility] our GI, telephone number provided today to schedule follow-up I do not believe the patient will need a referral for this, if the patient calls back and needs a referral I am fine signing for

## 2018-09-18 NOTE — Assessment & Plan Note (Signed)
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

## 2018-09-18 NOTE — Assessment & Plan Note (Signed)
Assessment: February/2020 CT chest high resolution showing multiple small 3 to 5 mm pulmonary nodules in lungs bilaterally Patient is a never smoker Patient with known asbestos exposure  Plan: Consider CT follow-up in 1 year to assess stability of pulmonary nodules based off of discussion with patient today

## 2018-09-18 NOTE — Patient Instructions (Addendum)
Dulera 100 >>> 2 puffs in the morning right when you wake up, rinse out your mouth after use, 12 hours later 2 puffs, rinse after use >>> Take this daily, no matter what >>> This is not a rescue inhaler   Only use your albuterol as a rescue medication to be used if you can't catch your breath by resting or doing a relaxed purse lip breathing pattern.  - The less you use it, the better it will work when you need it. - Ok to use up to 2 puffs  every 4 hours if you must but call for immediate appointment if use goes up over your usual need - Don't leave home without it !!  (think of it like the spare tire for your car)    Can consider CT in 1 year to evaluate nodules on lungs as you are a non smoker  Will discuss case with Dr. Tonia Brooms   Follow up in 3 months with Dr. Tonia Brooms        Coronavirus (COVID-19) Are you at risk?  Are you at risk for the Coronavirus (COVID-19)?  To be considered HIGH RISK for Coronavirus (COVID-19), you have to meet the following criteria:  . Traveled to [place], [place], [place], [place] or [place]; or in the [place] to [place], [place], [place], or [place]; and have fever, cough, and shortness of breath within the last 2 weeks of travel OR . Been in close contact with a person diagnosed with COVID-19 within the last 2 weeks and have fever, cough, and shortness of breath . IF YOU DO NOT MEET THESE CRITERIA, YOU ARE CONSIDERED LOW RISK FOR COVID-19.  What to do if you are HIGH RISK for COVID-19?  Marland Kitchen If you are having a medical emergency, call 911. . Seek medical care right away. Before you go to a doctor's office, urgent care or emergency department, call ahead and tell them about your recent travel, contact with someone diagnosed with COVID-19, and your symptoms. You should receive instructions from your physician's office regarding next steps of care.  . When you arrive at healthcare provider, tell the healthcare staff immediately you have returned  from visiting [place], [place], [place], [place] or [place]; or traveled in the [place] to [place], [place], [place], or [place]; in the last two weeks or you have been in close contact with a person diagnosed with COVID-19 in the last 2 weeks.   . Tell the health care staff about your symptoms: fever, cough and shortness of breath. . After you have been seen by a medical provider, you will be either: o Tested for (COVID-19) and discharged home on quarantine except to seek medical care if symptoms worsen, and asked to  - Stay home and avoid contact with others until you get your results (4-5 days)  - Avoid travel on public transportation if possible (such as bus, train, or airplane) or o Sent to the Emergency Department by EMS for evaluation, COVID-19 testing, and possible admission depending on your condition and test results.  What to do if you are LOW RISK for COVID-19?  Reduce your risk of any infection by using the same precautions used for avoiding the common cold or flu:  Marland Kitchen Wash your hands often with soap and warm water for at least 20 seconds.  If soap and water are not readily available, use an alcohol-based hand sanitizer with at least 60% alcohol.  . If coughing or sneezing, cover  your mouth and nose by coughing or sneezing into the elbow areas of your shirt or coat, into a tissue or into your sleeve (not your hands). . Avoid shaking hands with others and consider head nods or verbal greetings only. . Avoid touching your eyes, nose, or mouth with unwashed hands.  . Avoid close contact with people who are sick. . Avoid places or events with large numbers of people in one location, like concerts or sporting events. . Carefully consider travel plans you have or are making. . If you are planning any travel outside or inside the [name], visit the CDC's Travelers' Health webpage for the latest health notices. . If you have some symptoms but not all symptoms, continue to monitor at  home and seek medical attention if your symptoms worsen. . If you are having a medical emergency, call 911.   ADDITIONAL HEALTHCARE OPTIONS FOR PATIENTS  [facility] / e-Visit: [email or web address]         [facility]: [phone]  [facility]: [phone]                   [facility]: [phone]           It is flu season:   >>> Best ways to protect herself from the flu: Receive the yearly flu vaccine, practice good hand hygiene washing with soap and also using hand sanitizer when available, eat a nutritious meals, get adequate rest, hydrate appropriately   Please contact the office if your symptoms worsen or you have concerns that you are not improving.   Thank you for choosing [facility] for your healthcare, and for allowing [name] to partner with you on your healthcare journey. I am thankful to be able to provide care to you today.   Elisha Headland FNP-C

## 2018-09-18 NOTE — Assessment & Plan Note (Signed)
Assessment: Mallampati 4 May/2010 sleep study showed an AHI of 32.9  Plan: Continue CPAP use daily

## 2018-09-19 ENCOUNTER — Ambulatory Visit: Payer: PRIVATE HEALTH INSURANCE | Admitting: Physician Assistant

## 2018-09-20 ENCOUNTER — Encounter

## 2018-10-03 ENCOUNTER — Ambulatory Visit (INDEPENDENT_AMBULATORY_CARE_PROVIDER_SITE_OTHER): Payer: PRIVATE HEALTH INSURANCE | Admitting: Family Medicine

## 2018-10-03 ENCOUNTER — Encounter: Payer: Self-pay | Admitting: Family Medicine

## 2018-10-03 VITALS — Ht 71.0 in | Wt 259.0 lb

## 2018-10-03 DIAGNOSIS — I10 Essential (primary) hypertension: Secondary | ICD-10-CM

## 2018-10-03 DIAGNOSIS — E119 Type 2 diabetes mellitus without complications: Secondary | ICD-10-CM

## 2018-10-03 NOTE — Progress Notes (Signed)
Virtual Visit via Telephone Note  I connected with Jordan Mcconnell on 10/03/18 at 11:30 AM EDT by telephone and verified that I am speaking with the correct person using two identifiers.   I discussed the limitations, risks, security and privacy concerns of performing an evaluation and management service by telephone and the availability of in person appointments. I also discussed with the patient that there may be a patient responsible charge related to this service. The patient expressed understanding and agreed to proceed.  Pt is at home, I am in office  History of Present Illness: HTN- chronic problem, on HCTZ 25mg  daily.  Unable to check BP today.  Denies CP.  Continues to have SOB- has seen Cards and Pulmonary.  No HAs, visual changes, edema.  DM- chronic problem, on Metformin 500mg  BID.  UTD on eye exam, foot exam, microalbumin.  Denies symptomatic lows.  No numbness/tingling of hands/feet.  No abd pain, N/V  Obesity- pt is down 10 lbs since December.  Walking regularly   Observations/Objective: Pt is able to speak clearly, coherently without shortness of breath or increased work of breathing.  Thought process is linear.  Mood is appropriate.   Assessment and Plan: HTN- unable to assess today but history of adequate control.  Continues to have SOB- seeing Pulmonary and Cards.  Check labs.  Adjust meds prn.  DM- chronic problem.  Tolerating Metformin w/o difficulty.  UTD on eye exam, foot exam, microalbumin.  Check labs.  Adjust meds prn   Obesity- applauded his efforts at weight loss.  Check labs  Follow Up Instructions: Labs on Friday OV 3-4 months   I discussed the assessment and treatment plan with the patient. The patient was provided an opportunity to ask questions and all were answered. The patient agreed with the plan and demonstrated an understanding of the instructions.   The patient was advised to call back or seek an in-person evaluation if the symptoms worsen or if  the condition fails to improve as anticipated.  I provided 24 minutes of non-face-to-face time during this encounter.   Neena Rhymes, MD

## 2018-10-03 NOTE — Progress Notes (Signed)
I have discussed the procedure for the virtual visit with the patient who has given consent to proceed with assessment and treatment.   Cleopatra Sardo, CMA     

## 2018-10-04 ENCOUNTER — Ambulatory Visit: Payer: PRIVATE HEALTH INSURANCE | Admitting: Family Medicine

## 2018-10-05 ENCOUNTER — Other Ambulatory Visit (INDEPENDENT_AMBULATORY_CARE_PROVIDER_SITE_OTHER): Payer: PRIVATE HEALTH INSURANCE

## 2018-10-05 DIAGNOSIS — E119 Type 2 diabetes mellitus without complications: Secondary | ICD-10-CM | POA: Diagnosis not present

## 2018-10-05 DIAGNOSIS — I10 Essential (primary) hypertension: Secondary | ICD-10-CM | POA: Diagnosis not present

## 2018-10-05 LAB — HEPATIC FUNCTION PANEL
ALT: 32 U/L (ref 0–53)
AST: 19 U/L (ref 0–37)
Albumin: 4.3 g/dL (ref 3.5–5.2)
Alkaline Phosphatase: 76 U/L (ref 39–117)
Bilirubin, Direct: 0.1 mg/dL (ref 0.0–0.3)
Total Bilirubin: 0.5 mg/dL (ref 0.2–1.2)
Total Protein: 6.9 g/dL (ref 6.0–8.3)

## 2018-10-05 LAB — CBC WITH DIFFERENTIAL/PLATELET
Basophils Absolute: 0.1 10*3/uL (ref 0.0–0.1)
Basophils Relative: 0.8 % (ref 0.0–3.0)
Eosinophils Absolute: 0.4 10*3/uL (ref 0.0–0.7)
Eosinophils Relative: 5.1 % — ABNORMAL HIGH (ref 0.0–5.0)
HCT: 45.9 % (ref 39.0–52.0)
Hemoglobin: 15.6 g/dL (ref 13.0–17.0)
Lymphocytes Relative: 33.1 % (ref 12.0–46.0)
Lymphs Abs: 2.5 10*3/uL (ref 0.7–4.0)
MCHC: 34 g/dL (ref 30.0–36.0)
MCV: 87.2 fl (ref 78.0–100.0)
Monocytes Absolute: 0.5 10*3/uL (ref 0.1–1.0)
Monocytes Relative: 6.7 % (ref 3.0–12.0)
Neutro Abs: 4 10*3/uL (ref 1.4–7.7)
Neutrophils Relative %: 54.3 % (ref 43.0–77.0)
Platelets: 193 10*3/uL (ref 150.0–400.0)
RBC: 5.26 Mil/uL (ref 4.22–5.81)
RDW: 13.7 % (ref 11.5–15.5)
WBC: 7.4 10*3/uL (ref 4.0–10.5)

## 2018-10-05 LAB — LIPID PANEL
Cholesterol: 130 mg/dL (ref 0–200)
HDL: 32.2 mg/dL — ABNORMAL LOW (ref >39.00)
LDL Cholesterol: 67 mg/dL (ref 0–99)
NonHDL: 97.53
Total CHOL/HDL Ratio: 4
Triglycerides: 155 mg/dL — ABNORMAL HIGH (ref 0.0–149.0)
VLDL: 31 mg/dL (ref 0.0–40.0)

## 2018-10-05 LAB — BASIC METABOLIC PANEL
BUN: 19 mg/dL (ref 6–23)
CO2: 29 mEq/L (ref 19–32)
Calcium: 9.8 mg/dL (ref 8.4–10.5)
Chloride: 100 mEq/L (ref 96–112)
Creatinine, Ser: 0.77 mg/dL (ref 0.40–1.50)
GFR: 102.31 mL/min (ref >60.00)
Glucose, Bld: 171 mg/dL — ABNORMAL HIGH (ref 70–99)
Potassium: 4.5 mEq/L (ref 3.5–5.1)
Sodium: 138 mEq/L (ref 135–145)

## 2018-10-05 LAB — HEMOGLOBIN A1C: Hgb A1c MFr Bld: 6.3 % (ref 4.6–6.5)

## 2018-10-05 LAB — TSH: TSH: 1.76 u[IU]/mL (ref 0.35–4.50)

## 2018-10-06 ENCOUNTER — Other Ambulatory Visit: Payer: PRIVATE HEALTH INSURANCE

## 2018-10-11 ENCOUNTER — Telehealth: Payer: Self-pay | Admitting: Pulmonary Disease

## 2018-10-11 MED ORDER — MOMETASONE FURO-FORMOTEROL FUM 100-5 MCG/ACT IN AERO: 2.0000 | INHALATION_SPRAY | Freq: Two times a day (BID) | RESPIRATORY_TRACT | 5 refills | Status: DC

## 2018-10-11 NOTE — Telephone Encounter (Signed)
Called and spoke with pt's wife Meriam Sprague in regards to the [facility] Rx. Verified pt's preferred pharmacy that the Rx needed to be sent to and have sent Rx in for pt. Nothing further needed.

## 2018-10-16 ENCOUNTER — Telehealth: Payer: Self-pay | Admitting: Pulmonary Disease

## 2018-10-16 NOTE — Telephone Encounter (Signed)
PA started via covermymeds [id number] Left message to make pt aware authorization has been initiated.

## 2018-10-16 NOTE — Telephone Encounter (Signed)
Called [facility] needs PA.

## 2018-10-17 MED ORDER — BUDESONIDE-FORMOTEROL FUMARATE 80-4.5 MCG/ACT IN AERO: 2.0000 | INHALATION_SPRAY | Freq: Two times a day (BID) | RESPIRATORY_TRACT | 6 refills | Status: DC

## 2018-10-17 NOTE — Telephone Encounter (Signed)
Switch to Symbicort 80   Continue Symbicort 80 >>> 2 puffs in the morning right when you wake up, rinse out your mouth after use, 12 hours later 2 puffs, rinse after use >>> Take this daily, no matter what >>> This is not a rescue inhaler   6 refills   Please place order and update the patient.  Jordan Mcconnell

## 2018-10-17 NOTE — Telephone Encounter (Signed)
Attempt to call, no answer.  Left VM to call back re: medication substitute for [facility].  Order pending for Symbicort 80

## 2018-10-17 NOTE — Telephone Encounter (Signed)
Patients wife Jordan Mcconnell returning phone call.  Phone number is [phone].

## 2018-10-17 NOTE — Telephone Encounter (Signed)
Received a letter from insurance stating that PA was denied. Insurance will cover one of the following instead: Advair Diskus or HFA (generic included), Breo, Symbicort, and Wixela Inhub.   Jordan Mcconnell, would like [name] to start an appeal or would you like to switch him to one of the mentioned inhalers above. Please advise. Thanks!

## 2018-10-17 NOTE — Telephone Encounter (Signed)
Returned call and spoke with wife, as patient not available.  Informed that PA denied and Elisha Headland, NP has reviewed this and offered an alternative medication Symbicort 80 to use in place of [facility] as daily inhaler with new instructions for use.  Wife agrees to proceed with recommendation.  [facility]icort sent to [facility] per request.  Nothing further needed and will close this encounter.

## 2018-12-13 LAB — HM DIABETES EYE EXAM

## 2018-12-14 ENCOUNTER — Ambulatory Visit: Payer: PRIVATE HEALTH INSURANCE | Admitting: Pulmonary Disease

## 2019-01-11 ENCOUNTER — Ambulatory Visit (INDEPENDENT_AMBULATORY_CARE_PROVIDER_SITE_OTHER): Payer: PRIVATE HEALTH INSURANCE | Admitting: Pulmonary Disease

## 2019-01-11 ENCOUNTER — Other Ambulatory Visit: Payer: Self-pay

## 2019-01-11 ENCOUNTER — Encounter: Payer: Self-pay | Admitting: Pulmonary Disease

## 2019-01-11 VITALS — BP 118/78 | HR 80 | Ht 71.0 in | Wt 262.2 lb

## 2019-01-11 DIAGNOSIS — R0609 Other forms of dyspnea: Secondary | ICD-10-CM | POA: Diagnosis not present

## 2019-01-11 DIAGNOSIS — Z6837 Body mass index (BMI) 37.0-37.9, adult: Secondary | ICD-10-CM

## 2019-01-11 DIAGNOSIS — R918 Other nonspecific abnormal finding of lung field: Secondary | ICD-10-CM

## 2019-01-11 DIAGNOSIS — G4733 Obstructive sleep apnea (adult) (pediatric): Secondary | ICD-10-CM

## 2019-01-11 DIAGNOSIS — Z9989 Dependence on other enabling machines and devices: Secondary | ICD-10-CM

## 2019-01-11 MED ORDER — BUDESONIDE-FORMOTEROL FUMARATE 160-4.5 MCG/ACT IN AERO: 2.0000 | INHALATION_SPRAY | Freq: Two times a day (BID) | RESPIRATORY_TRACT | 6 refills | Status: DC

## 2019-01-11 NOTE — Progress Notes (Signed)
Synopsis: Referred in Jan 2020 for SOB by Midge Minium, MD  Subjective:   PATIENT ID: Jordan Mcconnell GENDER: male DOB: Nov 20, 1955, MRN: [id number]  Chief Complaint  Patient presents with  . Follow-up    He report his SOB has gotten a little better. He states he is not getting as SOB as frequently. Reports using his symbicort daily.     PMH HTN. Obesity, OSA on CPAP. With exertion will get SOB. On occasion will have SOB and chest tightness. He feels like this has been going on for several months. Wife has noticed a significant change. And his endurance has definitely changed.  Overall he describes symptoms that have been progressively getting worse over the past year.  His job has somewhat changed.  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  He describes a couple of events in which he has exceeded his threshold of exercise tolerance causing significant dyspnea.  He was recently on vacation in the [place] and was swimming with his daughters and developed progressive shortness of breath with exertion.  He also describes events being outside and climbing hills.  He has had cardiac evaluation to include echocardiogram and heart catheterization which was normal.  He was told that he did not think there was anything wrong with his cardiac function because his dyspnea.  He does have sleep apnea and wears his CPAP religiously every night.  OV 12/12/2018: Patient doing well today.  He has had significant cardiac work-up.  Most of which has been negative.  He is doing well with his CPAP continues to do so.  Has been very very active the springtime and summer.  Regular bass fisherman.  Has been doing well.  Plans for some tournaments coming up soon.  Has no significant respiratory complaints today.  He does feel that hot weather and humidity will trigger his  shortness of breath some.  Today we discussed the potentials for mild intermittent type reactive airway disease versus mild intermittent asthma.  He has been using his Symbicort inhaler which she does feel as if has improved his respiratory symptoms.  He does need refills of this.  We also discussed potentially the need for increasing this if he is having ongoing symptoms right now is managed with 2 puffs twice daily of Symbicort 80.   Past Medical History:  Diagnosis Date  . Complication of anesthesia    one surgery pt. states he was hard to wake up  . GERD (gastroesophageal reflux disease)   . HTN (hypertension)   . 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 Obesity   . OSA (obstructive sleep apnea)    uses CPAP     Family History  Problem Relation Age of Onset  . Memory loss Mother   . Heart murmur Mother   . Colon cancer Father   . Stroke Father   . Cancer Father        colon  . Dementia Father   . Cancer Maternal Grandfather        lung and bone  . Memory loss Maternal Grandmother      Past Surgical History:  Procedure Laterality Date  . BACK SURGERY  08/2015  . COLONOSCOPY    . ESOPHAGEAL DILATION     1 or 2 times  . ESOPHAGOGASTRODUODENOSCOPY N/A 10/30/2013  Procedure: ESOPHAGOGASTRODUODENOSCOPY (EGD);  Surgeon: Charolett Bumpers, MD;  Location: [facility];  Service: Endoscopy;  Laterality: N/A;  . JOINT REPLACEMENT     bilateral knees  . LEFT HEART CATH AND CORONARY ANGIOGRAPHY N/A 07/13/2018   Procedure: LEFT HEART CATH AND CORONARY ANGIOGRAPHY;  Surgeon: Corky Crafts, MD;  Location: [facility]ASIVE CV LAB;  Service: Cardiovascular;  Laterality: N/A;  . SHOULDER ARTHROSCOPY     Bilateral 3 times each shoulder    Social History   Socioeconomic History  . Marital status: Married    Spouse name: Not on file  . Number of children: Not on file  . Years of education: Not on file  . Highest education level: Not on file  Occupational History  . Not  on file  Social Needs  . Financial resource strain: Not on file  . Food insecurity    Worry: Not on file    Inability: Not on file  . Transportation needs    Medical: Not on file    Non-medical: Not on file  Tobacco Use  . Smoking status: Never Smoker  . Smokeless tobacco: Never Used  Substance and Sexual Activity  . Alcohol use: No  . Drug use: No  . Sexual activity: Not on file  Lifestyle  . Physical activity    Days per week: Not on file    Minutes per session: Not on file  . Stress: Not on file  Relationships  . Social Musician on phone: Not on file    Gets together: Not on file    Attends religious service: Not on file    Active member of club or organization: Not on file    Attends meetings of clubs or organizations: Not on file    Relationship status: Not on file  . Intimate partner violence    Fear of current or ex partner: Not on file    Emotionally abused: Not on file    Physically abused: Not on file    Forced sexual activity: Not on file  Other Topics Concern  . Not on file  Social History Narrative  . Not on file     Allergies  Allergen Reactions  . Penicillins Rash    DID THE REACTION INVOLVE: Swelling of the face/tongue/throat, SOB, or low BP? No Sudden or severe rash/hives, skin peeling, or the inside of the mouth or nose? Yes Did it require medical treatment? Yes When did it last happen?childhood allergy If all above answers are "NO", may proceed with cephalosporin use.    . Sulfa Antibiotics Rash  . Codeine Nausea Only  . Tramadol Nausea Only    Severe dizziness     Outpatient Medications Prior to Visit  Medication Sig Dispense Refill  . albuterol (PROVENTIL HFA;VENTOLIN HFA) 108 (90 Base) MCG/ACT inhaler Inhale 2 puffs into the lungs every 4 (four) hours as needed for wheezing or shortness of breath. 1 Inhaler 6  . hydrochlorothiazide (HYDRODIURIL) 25 MG tablet TAKE 1 TABLET BY MOUTH EVERY DAY *INS ALLOWS 30 DAYS 30  tablet 7  . metFORMIN (GLUCOPHAGE) 500 MG tablet TAKE 1 TABLET BY MOUTH TWICE A DAY WITH FOOD 60 tablet 6  . budesonide-formoterol (SYMBICORT) 80-4.5 MCG/ACT inhaler Inhale 2 puffs into the lungs 2 (two) times daily. Rinse mouth after use. 1 Inhaler 6  . nitroGLYCERIN (NITROSTAT) 0.4 MG SL tablet Place 1 tablet (0.4 mg total) under the tongue every 5 (five) minutes as needed for chest pain. 90 tablet  3  . mometasone-formoterol (DULERA) 100-5 MCG/ACT AERO Inhale 2 puffs into the lungs 2 (two) times daily. (Patient not taking: Reported on 10/17/2018) 1 Inhaler 5   No facility-administered medications prior to visit.     Review of Systems  Constitutional: Negative for chills, fever, malaise/fatigue and weight loss.  HENT: Negative for hearing loss, sore throat and tinnitus.   Eyes: Negative for blurred vision and double vision.  Respiratory: Negative for cough, hemoptysis, sputum production, shortness of breath, wheezing and stridor.   Cardiovascular: Negative for chest pain, palpitations, orthopnea, leg swelling and PND.  Gastrointestinal: Negative for abdominal pain, constipation, diarrhea, heartburn, nausea and vomiting.  Genitourinary: Negative for dysuria, hematuria and urgency.  Musculoskeletal: Negative for joint pain and myalgias.  Skin: Negative for itching and rash.  Neurological: Negative for dizziness, tingling, weakness and headaches.  Endo/Heme/Allergies: Negative for environmental allergies. Does not bruise/bleed easily.  Psychiatric/Behavioral: Negative for depression. The patient is not nervous/anxious and does not have insomnia.   All other systems reviewed and are negative.    Objective:  Physical Exam Vitals signs reviewed.  Constitutional:      General: He is not in acute distress.    Appearance: He is well-developed.  HENT:     Head: Normocephalic and atraumatic.  Eyes:     General: No scleral icterus.    Conjunctiva/sclera: Conjunctivae normal.     Pupils:  Pupils are equal, round, and reactive to light.  Neck:     Musculoskeletal: Neck supple.     Vascular: No JVD.     Trachea: No tracheal deviation.  Cardiovascular:     Rate and Rhythm: Normal rate and regular rhythm.     Heart sounds: Normal heart sounds. No murmur.  Pulmonary:     Effort: Pulmonary effort is normal. No tachypnea, accessory muscle usage or respiratory distress.     Breath sounds: Normal breath sounds. No stridor. No wheezing, rhonchi or rales.  Abdominal:     General: Bowel sounds are normal. There is no distension.     Palpations: Abdomen is soft.     Tenderness: There is no abdominal tenderness.     Comments: Obese abdomen  Musculoskeletal:        General: No tenderness.  Lymphadenopathy:     Cervical: No cervical adenopathy.  Skin:    General: Skin is warm and dry.     Capillary Refill: Capillary refill takes less than 2 seconds.     Findings: No rash.  Neurological:     Mental Status: He is alert and oriented to person, place, and time.  Psychiatric:        Behavior: Behavior normal.      Vitals:   01/11/19 0926  BP: 118/78  Pulse: 80  SpO2: 96%  Weight: 262 lb 3.2 oz (118.9 kg)  Height: 5\' 11"  (1.803 m)   96% on RA BMI Readings from Last 3 Encounters:  01/11/19 36.57 kg/m  10/03/18 36.12 kg/m  09/18/18 37.24 kg/m   Wt Readings from Last 3 Encounters:  01/11/19 262 lb 3.2 oz (118.9 kg)  10/03/18 259 lb (117.5 kg)  09/18/18 267 lb (121.1 kg)     CBC    Component Value Date/Time   WBC 7.4 10/05/2018 0907   RBC 5.26 10/05/2018 0907   HGB 15.6 10/05/2018 0907   HCT 45.9 10/05/2018 0907   PLT 193.0 10/05/2018 0907   MCV 87.2 10/05/2018 0907   MCH 28.9 08/01/2015 0844   MCHC 34.0 10/05/2018 0907  RDW 13.7 10/05/2018 0907   LYMPHSABS 2.5 10/05/2018 0907   MONOABS 0.5 10/05/2018 0907   EOSABS 0.4 10/05/2018 0907   BASOSABS 0.1 10/05/2018 0907    Chest Imaging: Chest x-ray 06/21/2018: Poor inspiratory effort, no infiltrate no  acute abnormality. The patient's images have been independently reviewed by me.    High-resolution CT imaging of the chest.: Small subcentimeter nodules which will recommend for follow-up.  No evidence of ILD. The patient's images have been independently reviewed by me.    Pulmonary Functions Testing Results: PFT Results Latest Ref Rng & Units 08/04/2018  FVC-Pre L 3.32  FVC-Predicted Pre % 68  FVC-Post L 3.38  FVC-Predicted Post % 69  Pre FEV1/FVC % % 82  Post FEV1/FCV % % 87  FEV1-Pre L 2.73  FEV1-Predicted Pre % 74  FEV1-Post L 2.94  DLCO UNC% % 34  DLCO COR %Predicted % 146  TLC L 5.59  TLC % Predicted % 77  RV % Predicted % 110    Polysomnogram 11/03/2008: AHI 32.9, during REM sleep 37.9 Epworth baseline 16  FeNO: None   Pathology: None   Echocardiogram:  2010 - Normal ejection fraction  Heart Catheterization:  07/13/2018  Prox LAD lesion is 25% stenosed.  Mid LAD lesion is 25% stenosed.  The left ventricular systolic function is normal.  The left ventricular ejection fraction is 55-65% by visual estimate.  LV end diastolic pressure is mildly elevated.  There is no aortic valve stenosis.  Tortuous right subclavian makes catheter torquing difficult. If emergency cath was needed in the future, would consider different approach rather than right radial artery.       Assessment & Plan:     ICD-10-CM   1. DOE (dyspnea on exertion)  R06.09   2. OSA (obstructive sleep apnea)  G47.33   3. Morbid obesity (HCC)  E66.01   4. Lung nodules  R91.8   5. BMI 37.0-37.9, adult  Z68.37   6. OSA on CPAP  G47.33    Z99.89   7. Abnormal findings on diagnostic imaging of lung  R91.8 CT CHEST WO CONTRAST   Discussion:  This is a 63 year old gentleman with a BMI, Body mass index is 36.57 kg/m.,  With complaints of continued dyspnea on exertion.  He has had a significant work-up to include normal cardiac evaluation, normal heart catheterization, normal pulmonary function  test despite low DLCO, high-resolution CT imaging with no significant interstitial findings.  Today we discussed the risk benefits and alternatives of proceeding with further cardiac testing to include items such as cardiopulmonary exercise.  With evidence of reduced ERV on pulmonary function test I believe most of this is related to physical deconditioning and body habitus.  I would prefer to see the patient change his lifestyle and exercise routine prior to doing additional advanced diagnostics for the determination of his dyspnea.  His symptoms may very well be from reactive airway disease or mild intermittent asthma.  This treatment with Symbicort has improved his symptoms.  We will change his prescription to 160 Symbicort, 1 to 2 puffs twice daily.  He was on 80, 2 puffs twice daily.  As for his lung nodules these are small subcentimeter in size and will recommend a 8625-month follow-up.  I have ordered this noncontrasted CT of the chest for follow-up of his lung nodules.  Patient to return to clinic in 1 year or if symptoms worsen.  I reviewed the patient's imaging from February with him today in the office.  Additionally  the patient was counseled on staying active and ongoing exercise to consider weight loss with dieting.  Greater than 50% of this patient's 40-minute office visit was been face-to-face cussing the recommendations from plan.  As well as reviewing images and counseling regarding inhaler regimen and management.     Current Outpatient Medications:  .  albuterol (PROVENTIL HFA;VENTOLIN HFA) 108 (90 Base) MCG/ACT inhaler, Inhale 2 puffs into the lungs every 4 (four) hours as needed for wheezing or shortness of breath., Disp: 1 Inhaler, Rfl: 6 .  hydrochlorothiazide (HYDRODIURIL) 25 MG tablet, TAKE 1 TABLET BY MOUTH EVERY DAY *INS ALLOWS 30 DAYS, Disp: 30 tablet, Rfl: 7 .  metFORMIN (GLUCOPHAGE) 500 MG tablet, TAKE 1 TABLET BY MOUTH TWICE A DAY WITH FOOD, Disp: 60 tablet, Rfl: 6 .   budesonide-formoterol (SYMBICORT) 160-4.5 MCG/ACT inhaler, Inhale 2 puffs into the lungs 2 (two) times a day. 1-2 puffs twice daily, Disp: 1 Inhaler, Rfl: 6 .  nitroGLYCERIN (NITROSTAT) 0.4 MG SL tablet, Place 1 tablet (0.4 mg total) under the tongue every 5 (five) minutes as needed for chest pain., Disp: 90 tablet, Rfl: 3   Josephine IgoBradley L Ulah Olmo, DO [facility] 01/11/2019 9:57 AM

## 2019-01-11 NOTE — Patient Instructions (Addendum)
Thank you for visiting Dr. Valeta Harms at [facility]. Today we recommend the following:  CT scan in Feb 2021. Orders Placed This Encounter  Procedures  . CT CHEST WO CONTRAST   Meds ordered this encounter  Medications  . budesonide-formoterol (SYMBICORT) 160-4.5 MCG/ACT inhaler    Sig: Inhale 2 puffs into the lungs 2 (two) times a day. 1-2 puffs twice daily    Dispense:  1 Inhaler    Refill:  6   Return in about 1 year (around 01/11/2020).

## 2019-01-15 ENCOUNTER — Encounter: Payer: Self-pay | Admitting: Family Medicine

## 2019-01-15 ENCOUNTER — Other Ambulatory Visit: Payer: Self-pay

## 2019-01-15 ENCOUNTER — Ambulatory Visit (INDEPENDENT_AMBULATORY_CARE_PROVIDER_SITE_OTHER): Payer: PRIVATE HEALTH INSURANCE | Admitting: Family Medicine

## 2019-01-15 VITALS — BP 128/76 | HR 81 | Temp 98.1°F | Resp 16 | Ht 71.0 in | Wt 264.4 lb

## 2019-01-15 DIAGNOSIS — E119 Type 2 diabetes mellitus without complications: Secondary | ICD-10-CM | POA: Diagnosis not present

## 2019-01-15 LAB — HEMOGLOBIN A1C: Hgb A1c MFr Bld: 6.2 % (ref 4.6–6.5)

## 2019-01-15 NOTE — Progress Notes (Signed)
   Subjective:    Patient ID: Jordan Mcconnell, male    DOB: [birth year], 63 y.o.   MRN: [id number]  HPI DM- chronic problem, on Metformin 500mg  BID.  UTD on microalbumin.  UTD on eye exam.  Due for foot exam.  Denies CP.  + SOB- seeing pulmonary.  Denies symptomatic lows.  No abd pain, N/V.  No numbness/tingling of hands/feet.  No visual changes.  Morbid Obesity- pt's BMI is 36.87 and he has both HTN and DM.  He has gained 5 lbs since last visit.  'i'm just tired'.  Following a healthy diet, not exercising due to SOB.   Review of Systems For ROS see HPI     Objective:   Physical Exam Vitals signs reviewed.  Constitutional:      General: He is not in acute distress.    Appearance: He is well-developed. He is obese.  HENT:     Head: Normocephalic and atraumatic.  Eyes:     Conjunctiva/sclera: Conjunctivae normal.     Pupils: Pupils are equal, round, and reactive to light.  Neck:     Musculoskeletal: Normal range of motion and neck supple.     Thyroid: No thyromegaly.  Cardiovascular:     Rate and Rhythm: Normal rate and regular rhythm.     Heart sounds: Normal heart sounds. No murmur.  Pulmonary:     Effort: Pulmonary effort is normal. No respiratory distress.     Breath sounds: Normal breath sounds.  Abdominal:     General: Bowel sounds are normal. There is no distension.     Palpations: Abdomen is soft.  Lymphadenopathy:     Cervical: No cervical adenopathy.  Skin:    General: Skin is warm and dry.  Neurological:     Mental Status: He is alert and oriented to person, place, and time.     Cranial Nerves: No cranial nerve deficit.  Psychiatric:        Behavior: Behavior normal.           Assessment & Plan:

## 2019-01-15 NOTE — Assessment & Plan Note (Signed)
Chronic problem.  Tolerating Metformin.  UTD on eye exam- pt to fax a copy.  Foot exam done today.  UTD on microalbumin.  Stressed need for healthy diet and regular exercise.  Check labs.  Adjust meds prn

## 2019-01-15 NOTE — Assessment & Plan Note (Signed)
Ongoing issue.  Pt has gained 5 lbs since last visit.  Stressed need for healthy diet and regular exercise.  Will continue to follow.

## 2019-01-15 NOTE — Patient Instructions (Signed)
Schedule your complete physical in 3-4 months We'll notify you of your lab results and make any changes if needed Continue to work on healthy diet and regular exercise- you can do it!! Call with any questions or concerns Stay Safe!!!

## 2019-01-16 LAB — BASIC METABOLIC PANEL
BUN: 18 mg/dL (ref 6–23)
CO2: 31 mEq/L (ref 19–32)
Calcium: 8.9 mg/dL (ref 8.4–10.5)
Chloride: 103 mEq/L (ref 96–112)
Creatinine, Ser: 0.78 mg/dL (ref 0.40–1.50)
GFR: 100.71 mL/min (ref >60.00)
Glucose, Bld: 127 mg/dL — ABNORMAL HIGH (ref 70–99)
Potassium: 4.5 mEq/L (ref 3.5–5.1)
Sodium: 139 mEq/L (ref 135–145)

## 2019-01-25 ENCOUNTER — Encounter: Payer: Self-pay | Admitting: General Practice

## 2019-02-17 ENCOUNTER — Other Ambulatory Visit: Payer: Self-pay | Admitting: Family Medicine

## 2019-02-17 ENCOUNTER — Other Ambulatory Visit: Payer: Self-pay | Admitting: Cardiology

## 2019-03-05 ENCOUNTER — Telehealth: Payer: Self-pay | Admitting: Family Medicine

## 2019-03-05 NOTE — Telephone Encounter (Signed)
I have placed a preoperative risk evaluation in the bin upfront with a charge sheet

## 2019-03-05 NOTE — Telephone Encounter (Signed)
Paperwork given to PCP for completion.  

## 2019-03-06 ENCOUNTER — Telehealth: Payer: Self-pay | Admitting: *Deleted

## 2019-03-06 NOTE — Telephone Encounter (Signed)
Patient is scheduled 10/15 for his physical.  Do you want a separate appointment?

## 2019-03-06 NOTE — Telephone Encounter (Signed)
   Primary Cardiologist: Fransico Him, MD  Chart reviewed as part of pre-operative protocol coverage. Patient was contacted 03/06/2019 in reference to pre-operative risk assessment for pending surgery as outlined below.  Jordan Mcconnell was last seen on 08/02/2018 by Ermalinda Barrios, PA.  Since that day, Jordan Mcconnell has done well.  His mild dyspnea on exertion for which he had cardiac work-up continues to be very mild and unchanged.  He is able to complete much greater than 4 METS of physical activity without any exertional symptoms.  He had left heart cath on 07/13/2018 that showed only 25% LAD stenosis.  He had an echocardiogram 08/10/2018 showing normal EF with mild diastolic dysfunction but no issues with volume overload.  Therefore, based on ACC/AHA guidelines, the patient would be at acceptable risk for the planned procedure without further cardiovascular testing.   I will route this recommendation to the requesting party via Epic fax function and remove from pre-op pool.  Please call with questions.  Daune Perch, NP 03/06/2019, 3:50 PM

## 2019-03-06 NOTE — Telephone Encounter (Signed)
I would be happy to do it at his CPE.  It's confusing b/c the form he dropped off says surgery is in November but it's listed in the chart as October 11th.  We just need to clarify his surgical date

## 2019-03-06 NOTE — Telephone Encounter (Signed)
Pt has been scheduled.  °

## 2019-03-06 NOTE — Telephone Encounter (Signed)
   [facility] Pre-operative Risk Assessment    Request for surgical clearance:  1. What type of surgery is being performed? LEFT TOTAL HIP ARTHROPLASTY   2. When is this surgery scheduled? 05/16/19   3. What type of clearance is required (medical clearance vs. Pharmacy clearance to hold med vs. Both)? MEDICAL  4. Are there any medications that need to be held prior to surgery and how long?  NONE LISTED    5. Practice name and name of physician performing surgery? [facility]; DR. FRANK ALUISIO   6. What is your office phone number [phone]    7.   What is your office fax number [phone]  8.   Anesthesia type (None, local, MAC, general) ? CHOICE   Julaine Hua 03/06/2019, 10:49 AM  _________________________________________________________________   (provider comments below)

## 2019-03-06 NOTE — Telephone Encounter (Signed)
Can we please schedule pt

## 2019-03-06 NOTE — Telephone Encounter (Signed)
Needs surgical clearance appt for late September/early October

## 2019-03-26 ENCOUNTER — Ambulatory Visit (INDEPENDENT_AMBULATORY_CARE_PROVIDER_SITE_OTHER): Payer: PRIVATE HEALTH INSURANCE | Admitting: Family Medicine

## 2019-03-26 ENCOUNTER — Encounter: Payer: Self-pay | Admitting: Family Medicine

## 2019-03-26 ENCOUNTER — Other Ambulatory Visit: Payer: Self-pay

## 2019-03-26 VITALS — BP 135/83 | HR 62 | Temp 97.9°F | Resp 16 | Ht 71.0 in | Wt 276.0 lb

## 2019-03-26 DIAGNOSIS — Z01818 Encounter for other preprocedural examination: Secondary | ICD-10-CM

## 2019-03-26 DIAGNOSIS — I10 Essential (primary) hypertension: Secondary | ICD-10-CM | POA: Diagnosis not present

## 2019-03-26 DIAGNOSIS — Z0000 Encounter for general adult medical examination without abnormal findings: Secondary | ICD-10-CM

## 2019-03-26 DIAGNOSIS — Z23 Encounter for immunization: Secondary | ICD-10-CM

## 2019-03-26 DIAGNOSIS — Z125 Encounter for screening for malignant neoplasm of prostate: Secondary | ICD-10-CM

## 2019-03-26 LAB — CBC WITH DIFFERENTIAL/PLATELET
Basophils Absolute: 0.1 10*3/uL (ref 0.0–0.1)
Basophils Relative: 1.1 % (ref 0.0–3.0)
Eosinophils Absolute: 0.4 10*3/uL (ref 0.0–0.7)
Eosinophils Relative: 4.4 % (ref 0.0–5.0)
HCT: 46.2 % (ref 39.0–52.0)
Hemoglobin: 15.5 g/dL (ref 13.0–17.0)
Lymphocytes Relative: 36.3 % (ref 12.0–46.0)
Lymphs Abs: 3.3 10*3/uL (ref 0.7–4.0)
MCHC: 33.6 g/dL (ref 30.0–36.0)
MCV: 87.6 fl (ref 78.0–100.0)
Monocytes Absolute: 0.7 10*3/uL (ref 0.1–1.0)
Monocytes Relative: 7.9 % (ref 3.0–12.0)
Neutro Abs: 4.5 10*3/uL (ref 1.4–7.7)
Neutrophils Relative %: 50.3 % (ref 43.0–77.0)
Platelets: 199 10*3/uL (ref 150.0–400.0)
RBC: 5.28 Mil/uL (ref 4.22–5.81)
RDW: 13.5 % (ref 11.5–15.5)
WBC: 9 10*3/uL (ref 4.0–10.5)

## 2019-03-26 NOTE — Assessment & Plan Note (Signed)
Pt's PE WNL w/ exception of obesity.  UTD on colonoscopy, Tdap.  Will get flu today.  Check labs.  Anticipatory guidance provided.

## 2019-03-26 NOTE — Assessment & Plan Note (Signed)
Chronic problem.  Adequate control.  Check labs.  No anticipated med changes.  Will follow. 

## 2019-03-26 NOTE — Progress Notes (Signed)
   Subjective:    Patient ID: Jordan Mcconnell, male    DOB: [birth year], 63 y.o.   MRN: [id number]  HPI CPE- UTD on colonoscopy, Tdap, eye exam, foot exam.  Due for flu.  Needs surgical clearance today for L total hip.  + 12 lbs weight gain.   Review of Systems Patient reports no vision/hearing changes, anorexia, fever ,adenopathy, persistant/recurrent hoarseness, swallowing issues, chest pain, palpitations, edema, persistant/recurrent cough, hemoptysis, dyspnea (rest,exertional, paroxysmal nocturnal), gastrointestinal  bleeding (melena, rectal bleeding), abdominal pain, excessive heart burn, GU symptoms (dysuria, hematuria, voiding/incontinence issues) syncope, focal weakness, memory loss, numbness & tingling, skin/hair/nail changes, depression, anxiety, abnormal bruising/bleeding, musculoskeletal symptoms/signs.     Objective:   Physical Exam General Appearance:    Alert, cooperative, no distress, appears stated age, obese  Head:    Normocephalic, without obvious abnormality, atraumatic  Eyes:    PERRL, conjunctiva/corneas clear, EOM's intact, fundi    benign, both eyes       Ears:    Normal TM's and external ear canals, both ears  Nose:   Deferred due to COVID  Throat:   Neck:   Supple, symmetrical, trachea midline, no adenopathy;       thyroid:  No enlargement/tenderness/nodules  Back:     Symmetric, no curvature, ROM normal, no CVA tenderness  Lungs:     Clear to auscultation bilaterally, respirations unlabored  Chest wall:    No tenderness or deformity  Heart:    Regular rate and rhythm, S1 and S2 normal, no murmur, rub   or gallop  Abdomen:     Soft, non-tender, bowel sounds active all four quadrants,    no masses, no organomegaly  Genitalia:    Deferred at pt's request  Rectal:    Extremities:   Extremities normal, atraumatic, no cyanosis or edema  Pulses:   2+ and symmetric all extremities  Skin:   Skin color, texture, turgor normal, no rashes or lesions  Lymph nodes:    Cervical, supraclavicular, and axillary nodes normal  Neurologic:   CNII-XII intact. Normal strength, sensation and reflexes      throughout          Assessment & Plan:  Surgical clearance- no reason on CPE or EKG to hold surgery.  Will await labs for final decision.

## 2019-03-26 NOTE — Patient Instructions (Signed)
Follow up in November or December to recheck diabetes We'll notify you of your lab results and make any changes if needed Continue to work on healthy diet and exercise as you are able Call with any questions or concerns Stay Safe! Emmit Alexanders with Surgery!!!

## 2019-03-27 LAB — LIPID PANEL
Cholesterol: 143 mg/dL (ref 0–200)
HDL: 33.7 mg/dL — ABNORMAL LOW (ref >39.00)
NonHDL: 109.51
Total CHOL/HDL Ratio: 4
Triglycerides: 203 mg/dL — ABNORMAL HIGH (ref 0.0–149.0)
VLDL: 40.6 mg/dL — ABNORMAL HIGH (ref 0.0–40.0)

## 2019-03-27 LAB — TSH: TSH: 2.31 u[IU]/mL (ref 0.35–4.50)

## 2019-03-27 LAB — BASIC METABOLIC PANEL
BUN: 14 mg/dL (ref 6–23)
CO2: 25 mEq/L (ref 19–32)
Calcium: 9.7 mg/dL (ref 8.4–10.5)
Chloride: 101 mEq/L (ref 96–112)
Creatinine, Ser: 0.79 mg/dL (ref 0.40–1.50)
GFR: 99.18 mL/min (ref >60.00)
Glucose, Bld: 76 mg/dL (ref 70–99)
Potassium: 4.3 mEq/L (ref 3.5–5.1)
Sodium: 138 mEq/L (ref 135–145)

## 2019-03-27 LAB — PSA: PSA: 1.34 ng/mL (ref 0.10–4.00)

## 2019-03-27 LAB — HEPATIC FUNCTION PANEL
ALT: 39 U/L (ref 0–53)
AST: 26 U/L (ref 0–37)
Albumin: 4.4 g/dL (ref 3.5–5.2)
Alkaline Phosphatase: 74 U/L (ref 39–117)
Bilirubin, Direct: 0.1 mg/dL (ref 0.0–0.3)
Total Bilirubin: 0.4 mg/dL (ref 0.2–1.2)
Total Protein: 7.1 g/dL (ref 6.0–8.3)

## 2019-03-27 LAB — LDL CHOLESTEROL, DIRECT: Direct LDL: 89 mg/dL

## 2019-04-09 NOTE — Progress Notes (Signed)
PCP - Annye Asa Cardiologist - [place]  Chest x-ray -06-21-18 epic  EKG - 03-26-19 epic Stress Test -  ECHO - 08-10-18 epic Cardiac Cath - 07-13-18 epic  Sleep Study -  CPAP - yes  Fasting Blood Sugar -  Checks Blood Sugar ____0_ times a day  Blood Thinner Instructions: Aspirin Instructions: Last Dose:  Anesthesia review: OSA on cpap  Patient denies shortness of breath, fever, cough and chest pain at PAT appointment   Patient verbalized understanding of instructions that were given to them at the PAT appointment. Patient was also instructed that they will need to review over the PAT instructions again at home before surgery.

## 2019-04-09 NOTE — Patient Instructions (Signed)
DUE TO COVID-19 ONLY ONE VISITOR IS ALLOWED TO COME WITH YOU AND STAY IN THE WAITING ROOM ONLY DURING PRE OP AND PROCEDURE DAY OF SURGERY. THE 1 VISITOR MAY VISIT WITH YOU AFTER SURGERY IN YOUR PRIVATE ROOM DURING VISITING HOURS ONLY!  YOU NEED TO HAVE A COVID 19 TEST ON_______ @_______ , THIS TEST MUST BE DONE BEFORE SURGERY, COME  [address], [place] , [phone].  [facility] ONCE YOUR COVID TEST IS COMPLETED, PLEASE BEGIN THE QUARANTINE INSTRUCTIONS AS OUTLINED IN YOUR HANDOUT.                Darcella GasmanKenneth W San  04/09/2019   Your procedure is scheduled on: 04-16-19   [facility]   Report to admitting at       1235 PM     Call this number if you have problems the morning of surgery [phone]    Remember: NO SOLID FOOD AFTER MIDNIGHT THE NIGHT PRIOR TO SURGERY. NOTHING BY MOUTH EXCEPT CLEAR LIQUIDS UNTIL   1200 pm . PLEASE FINISH  G2  DRINK PER SURGEON ORDER  WHICH NEEDS TO BE COMPLETED AT    1200 pm then nothing by mouth.    CLEAR LIQUID DIET   Foods Allowed                                                                     Foods Excluded  Coffee and tea, regular and decaf                             liquids that you cannot  Plain Jell-O any favor except red or purple                                           see through such as: Fruit ices (not with fruit pulp)                                     milk, soups, orange juice  Iced Popsicles                                    All solid food Carbonated beverages, regular and diet                                    Cranberry, grape and apple juices Sports drinks like Gatorade Lightly seasoned clear broth or consume(fat free) Sugar, honey syrup  Sample Menu Breakfast                                Lunch  Supper Cranberry juice                    Beef broth                            Chicken broth Jell-O                                      Grape juice                           Apple juice Coffee or tea                        Jell-O                                      Popsicle                                                Coffee or tea                        Coffee or tea  _____________________________________________________________________      BRUSH YOUR TEETH MORNING OF SURGERY AND RINSE YOUR MOUTH OUT, NO CHEWING GUM CANDY OR MINTS.     Take these medicines the morning of surgery with A SIP OF WATER: inhalers bring with you  DO NOT TAKE ANY DIABETIC MEDICATIONS DAY OF YOUR SURGERY                               You may not have any metal on your body including hair pins and              piercings  Do not wear jewelry,lotions, powders or perfumes, deodorant                       Men may shave face and neck.   Do not bring valuables to the hospital.  IS NOT             RESPONSIBLE   FOR VALUABLES.  Contacts, dentures or bridgework may not be worn into surgery.                  Please read over the following fact sheets you were given: _____________________________________________________________________          [facility] - Preparing for Surgery Before surgery, you can play an important role.  Because skin is not sterile, your skin needs to be as free of germs as possible.  You can reduce the number of germs on your skin by washing with CHG (chlorahexidine gluconate) soap before surgery.  CHG is an antiseptic cleaner which kills germs and bonds with the skin to continue killing germs even after washing. Please DO NOT use if you have an allergy to CHG or antibacterial soaps.  If your skin becomes reddened/irritated stop using the CHG and inform your nurse when you arrive at Short Stay. Do not shave (including legs  and underarms) for at least 48 hours prior to the first CHG shower.  You may shave your face/neck. Please follow these instructions carefully:  1.  Shower with CHG Soap the night before  surgery and the  morning of Surgery.  2.  If you choose to wash your hair, wash your hair first as usual with your  normal  shampoo.  3.  After you shampoo, rinse your hair and body thoroughly to remove the  shampoo.                           4.  Use CHG as you would any other liquid soap.  You can apply chg directly  to the skin and wash                       Gently with a scrungie or clean washcloth.  5.  Apply the CHG Soap to your body ONLY FROM THE NECK DOWN.   Do not use on face/ open                           Wound or open sores. Avoid contact with eyes, ears mouth and genitals (private parts).                       Wash face,  Genitals (private parts) with your normal soap.             6.  Wash thoroughly, paying special attention to the area where your surgery  will be performed.  7.  Thoroughly rinse your body with warm water from the neck down.  8.  DO NOT shower/wash with your normal soap after using and rinsing off  the CHG Soap.                9.  Pat yourself dry with a clean towel.            10.  Wear clean pajamas.            11.  Place clean sheets on your bed the night of your first shower and do not  sleep with pets. Day of Surgery : Do not apply any lotions/deodorants the morning of surgery.  Please wear clean clothes to the hospital/surgery center.  FAILURE TO FOLLOW THESE INSTRUCTIONS MAY RESULT IN THE CANCELLATION OF YOUR SURGERY PATIENT SIGNATURE_________________________________  NURSE SIGNATURE__________________________________  ________________________________________________________________________   Rogelia Mire  An incentive spirometer is a tool that can help keep your lungs clear and active. This tool measures how well you are filling your lungs with each breath. Taking long deep breaths may help reverse or decrease the chance of developing breathing (pulmonary) problems (especially infection) following:  A long period of time when you are unable to  move or be active. BEFORE THE PROCEDURE   If the spirometer includes an indicator to show your best effort, your nurse or respiratory therapist will set it to a desired goal.  If possible, sit up straight or lean slightly forward. Try not to slouch.  Hold the incentive spirometer in an upright position. INSTRUCTIONS FOR USE  1. Sit on the edge of your bed if possible, or sit up as far as you can in bed or on a chair. 2. Hold the incentive spirometer in an upright position. 3. Breathe out normally. 4. Place the mouthpiece in your mouth and seal  your lips tightly around it. 5. Breathe in slowly and as deeply as possible, raising the piston or the ball toward the top of the column. 6. Hold your breath for 3-5 seconds or for as long as possible. Allow the piston or ball to fall to the bottom of the column. 7. Remove the mouthpiece from your mouth and breathe out normally. 8. Rest for a few seconds and repeat Steps 1 through 7 at least 10 times every 1-2 hours when you are awake. Take your time and take a few normal breaths between deep breaths. 9. The spirometer may include an indicator to show your best effort. Use the indicator as a goal to work toward during each repetition. 10. After each set of 10 deep breaths, practice coughing to be sure your lungs are clear. If you have an incision (the cut made at the time of surgery), support your incision when coughing by placing a pillow or rolled up towels firmly against it. Once you are able to get out of bed, walk around indoors and cough well. You may stop using the incentive spirometer when instructed by your caregiver.  RISKS AND COMPLICATIONS  Take your time so you do not get dizzy or light-headed.  If you are in pain, you may need to take or ask for pain medication before doing incentive spirometry. It is harder to take a deep breath if you are having pain. AFTER USE  Rest and breathe slowly and easily.  It can be helpful to keep track of  a log of your progress. Your caregiver can provide you with a simple table to help with this. If you are using the spirometer at home, follow these instructions: [place] IF:   You are having difficultly using the spirometer.  You have trouble using the spirometer as often as instructed.  Your pain medication is not giving enough relief while using the spirometer.  You develop fever of 100.5 F (38.1 C) or higher. SEEK IMMEDIATE MEDICAL CARE IF:   You cough up bloody sputum that had not been present before.  You develop fever of 102 F (38.9 C) or greater.  You develop worsening pain at or near the incision site. MAKE SURE YOU:   Understand these instructions.  Will watch your condition.  Will get help right away if you are not doing well or get worse. Document Released: 11/01/2006 Document Revised: 09/13/2011 Document Reviewed: 01/02/2007 Exit[employer] Patient Information 2014 Exit[employer], [place].   ________________________________________________________________________  WHAT IS A BLOOD TRANSFUSION? Blood Transfusion Information  A transfusion is the replacement of blood or some of its parts. Blood is made up of multiple cells which provide different functions.  Red blood cells carry oxygen and are used for blood loss replacement.  White blood cells fight against infection.  Platelets control bleeding.  Plasma helps clot blood.  Other blood products are available for specialized needs, such as hemophilia or other clotting disorders. BEFORE THE TRANSFUSION  Who gives blood for transfusions?   Healthy volunteers who are fully evaluated to make sure their blood is safe. This is blood bank blood. Transfusion therapy is the safest it has ever been in the practice of medicine. Before blood is taken from a donor, a complete history is taken to make sure that person has no history of diseases nor engages in risky social behavior (examples are intravenous drug use or sexual  activity with multiple partners). The donor's travel history is screened to minimize risk of transmitting infections, such  as malaria. The donated blood is tested for signs of infectious diseases, such as HIV and hepatitis. The blood is then tested to be sure it is compatible with you in order to minimize the chance of a transfusion reaction. If you or a relative donates blood, this is often done in anticipation of surgery and is not appropriate for emergency situations. It takes many days to process the donated blood. RISKS AND COMPLICATIONS Although transfusion therapy is very safe and saves many lives, the main dangers of transfusion include:   Getting an infectious disease.  Developing a transfusion reaction. This is an allergic reaction to something in the blood you were given. Every precaution is taken to prevent this. The decision to have a blood transfusion has been considered carefully by your caregiver before blood is given. Blood is not given unless the benefits outweigh the risks. AFTER THE TRANSFUSION  Right after receiving a blood transfusion, you will usually feel much better and more energetic. This is especially true if your red blood cells have gotten low (anemic). The transfusion raises the level of the red blood cells which carry oxygen, and this usually causes an energy increase.  The nurse administering the transfusion will monitor you carefully for complications. HOME CARE INSTRUCTIONS  No special instructions are needed after a transfusion. You may find your energy is better. Speak with your caregiver about any limitations on activity for underlying diseases you may have. SEEK MEDICAL CARE IF:   Your condition is not improving after your transfusion.  You develop redness or irritation at the intravenous (IV) site. SEEK IMMEDIATE MEDICAL CARE IF:  Any of the following symptoms occur over the next 12 hours:  Shaking chills.  You have a temperature by mouth above 102 F  (38.9 C), not controlled by medicine.  Chest, back, or muscle pain.  People around you feel you are not acting correctly or are confused.  Shortness of breath or difficulty breathing.  Dizziness and fainting.  You get a rash or develop hives.  You have a decrease in urine output.  Your urine turns a dark color or changes to pink, red, or brown. Any of the following symptoms occur over the next 10 days:  You have a temperature by mouth above 102 F (38.9 C), not controlled by medicine.  Shortness of breath.  Weakness after normal activity.  The white part of the eye turns yellow (jaundice).  You have a decrease in the amount of urine or are urinating less often.  Your urine turns a dark color or changes to pink, red, or brown. Document Released: 06/18/2000 Document Revised: 09/13/2011 Document Reviewed: 02/05/2008 [facility] Patient Information 2014 [place], [place].  _______________________________________________________________________

## 2019-04-11 ENCOUNTER — Other Ambulatory Visit: Payer: Self-pay

## 2019-04-11 ENCOUNTER — Encounter (HOSPITAL_COMMUNITY)
Admission: RE | Admit: 2019-04-11 | Discharge: 2019-04-11 | Disposition: A | Discharge status: Home / Self Care | Payer: PRIVATE HEALTH INSURANCE | Source: Ambulatory Visit | Attending: Orthopedic Surgery | Admitting: Orthopedic Surgery

## 2019-04-11 ENCOUNTER — Encounter (HOSPITAL_COMMUNITY): Payer: Self-pay

## 2019-04-11 DIAGNOSIS — Z01812 Encounter for preprocedural laboratory examination: Secondary | ICD-10-CM | POA: Insufficient documentation

## 2019-04-11 DIAGNOSIS — E119 Type 2 diabetes mellitus without complications: Secondary | ICD-10-CM | POA: Diagnosis not present

## 2019-04-11 DIAGNOSIS — I10 Essential (primary) hypertension: Secondary | ICD-10-CM | POA: Insufficient documentation

## 2019-04-11 DIAGNOSIS — Z79899 Other long term (current) drug therapy: Secondary | ICD-10-CM | POA: Insufficient documentation

## 2019-04-11 DIAGNOSIS — M1612 Unilateral primary osteoarthritis, left hip: Secondary | ICD-10-CM | POA: Insufficient documentation

## 2019-04-11 DIAGNOSIS — Z6837 Body mass index (BMI) 37.0-37.9, adult: Secondary | ICD-10-CM | POA: Diagnosis not present

## 2019-04-11 DIAGNOSIS — Z96653 Presence of artificial knee joint, bilateral: Secondary | ICD-10-CM | POA: Insufficient documentation

## 2019-04-11 DIAGNOSIS — Z7984 Long term (current) use of oral hypoglycemic drugs: Secondary | ICD-10-CM | POA: Diagnosis not present

## 2019-04-11 DIAGNOSIS — I2510 Atherosclerotic heart disease of native coronary artery without angina pectoris: Secondary | ICD-10-CM | POA: Insufficient documentation

## 2019-04-11 DIAGNOSIS — G4733 Obstructive sleep apnea (adult) (pediatric): Secondary | ICD-10-CM | POA: Insufficient documentation

## 2019-04-11 DIAGNOSIS — E669 Obesity, unspecified: Secondary | ICD-10-CM | POA: Diagnosis not present

## 2019-04-11 HISTORY — DX: Other specified postprocedural states: Z98.890

## 2019-04-11 HISTORY — DX: Other specified postprocedural states: R11.2

## 2019-04-11 HISTORY — DX: Type 2 diabetes mellitus without complications: E11.9

## 2019-04-11 HISTORY — DX: Unspecified osteoarthritis, unspecified site: M19.90

## 2019-04-11 LAB — COMPREHENSIVE METABOLIC PANEL
ALT: 48 U/L — ABNORMAL HIGH (ref 0–44)
AST: 27 U/L (ref 15–41)
Albumin: 4.6 g/dL (ref 3.5–5.0)
Alkaline Phosphatase: 71 U/L (ref 38–126)
Anion gap: 12 (ref 5–15)
BUN: 19 mg/dL (ref 8–23)
CO2: 24 mmol/L (ref 22–32)
Calcium: 9.6 mg/dL (ref 8.9–10.3)
Chloride: 100 mmol/L (ref 98–111)
Creatinine, Ser: 0.82 mg/dL (ref 0.61–1.24)
GFR calc Af Amer: >60 mL/min (ref >60)
GFR calc non Af Amer: >60 mL/min (ref >60)
Glucose, Bld: 113 mg/dL — ABNORMAL HIGH (ref 70–99)
Potassium: 4.2 mmol/L (ref 3.5–5.1)
Sodium: 136 mmol/L (ref 135–145)
Total Bilirubin: 0.7 mg/dL (ref 0.3–1.2)
Total Protein: 8.1 g/dL (ref 6.5–8.1)

## 2019-04-11 LAB — CBC WITH DIFFERENTIAL/PLATELET
Abs Immature Granulocytes: 0.05 10*3/uL (ref 0.00–0.07)
Basophils Absolute: 0.1 10*3/uL (ref 0.0–0.1)
Basophils Relative: 1 %
Eosinophils Absolute: 0.4 10*3/uL (ref 0.0–0.5)
Eosinophils Relative: 5 %
HCT: 51.2 % (ref 39.0–52.0)
Hemoglobin: 16.9 g/dL (ref 13.0–17.0)
Immature Granulocytes: 1 %
Lymphocytes Relative: 36 %
Lymphs Abs: 3.4 10*3/uL (ref 0.7–4.0)
MCH: 29.1 pg (ref 26.0–34.0)
MCHC: 33 g/dL (ref 30.0–36.0)
MCV: 88.3 fL (ref 80.0–100.0)
Monocytes Absolute: 0.6 10*3/uL (ref 0.1–1.0)
Monocytes Relative: 7 %
Neutro Abs: 4.7 10*3/uL (ref 1.7–7.7)
Neutrophils Relative %: 50 %
Platelets: 226 10*3/uL (ref 150–400)
RBC: 5.8 MIL/uL (ref 4.22–5.81)
RDW: 12.9 % (ref 11.5–15.5)
WBC: 9.2 10*3/uL (ref 4.0–10.5)
nRBC: 0 % (ref 0.0–0.2)

## 2019-04-11 LAB — GLUCOSE, CAPILLARY: Glucose-Capillary: 181 mg/dL — ABNORMAL HIGH (ref 70–99)

## 2019-04-11 LAB — PROTIME-INR
INR: 0.9 (ref 0.8–1.2)
Prothrombin Time: 11.9 seconds (ref 11.4–15.2)

## 2019-04-11 LAB — SURGICAL PCR SCREEN
MRSA, PCR: NEGATIVE
Staphylococcus aureus: NEGATIVE

## 2019-04-11 LAB — APTT: aPTT: 29 seconds (ref 24–36)

## 2019-04-11 LAB — ABO/RH: ABO/RH(D): O POS

## 2019-04-11 LAB — HEMOGLOBIN A1C
Hgb A1c MFr Bld: 6.7 % — ABNORMAL HIGH (ref 4.8–5.6)
Mean Plasma Glucose: 145.59 mg/dL

## 2019-04-12 ENCOUNTER — Other Ambulatory Visit (HOSPITAL_COMMUNITY)
Admission: RE | Admit: 2019-04-12 | Discharge: 2019-04-12 | Disposition: A | Discharge status: Home / Self Care | Payer: PRIVATE HEALTH INSURANCE | Source: Ambulatory Visit | Attending: Orthopedic Surgery | Admitting: Orthopedic Surgery

## 2019-04-12 DIAGNOSIS — Z20828 Contact with and (suspected) exposure to other viral communicable diseases: Secondary | ICD-10-CM | POA: Diagnosis not present

## 2019-04-12 DIAGNOSIS — Z01812 Encounter for preprocedural laboratory examination: Secondary | ICD-10-CM | POA: Insufficient documentation

## 2019-04-12 NOTE — Progress Notes (Signed)
Anesthesia Chart Review   Case: [id number] Date/Time: 04/16/19 1450   Procedure: TOTAL HIP ARTHROPLASTY ANTERIOR APPROACH (Left Hip) -   Anesthesia type: Choice   Pre-op diagnosis: left hip osteoarthritis   Location: [facility] ROOM 10 / [facility]   Surgeon: Ollen Gross, MD      DISCUSSION:63 y.o. 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 Ollen Gross.   Pt cleared by cardiology 03/06/2019.  Per Berton Bon, NP, "Jordan Mcconnell was last seen on 08/02/2018 by Jacolyn Reedy, PA.  Since that day, Jordan Mcconnell has done well.  His mild dyspnea on exertion for which he had cardiac work-up continues to be very mild and unchanged.  He is able to complete much greater than 4 METS of physical activity without any exertional symptoms.  He had left heart cath on 07/13/2018 that showed only 25% LAD stenosis.  He had an echocardiogram 08/10/2018 showing normal EF with mild diastolic dysfunction but no issues with volume overload. Therefore, based on ACC/AHA guidelines, the patient would be at acceptable risk for the planned procedure without further cardiovascular testing."  Anticipate pt can proceed with planned procedure barring acute status change.   VS: BP (!) 146/82   Pulse 82   Temp 36.7 C (Oral)   Resp 16   Ht 5\' 11"  (1.803 m)   Wt 120.4 kg   SpO2 98%   BMI 37.03 kg/m   PROVIDERS: , MD is PCP   Sheliah Hatch, MD is Cardiologist  LABS: Labs reviewed: Acceptable for surgery. (all labs ordered are listed, but only abnormal results are displayed)  Labs Reviewed  HEMOGLOBIN A1C - Abnormal; Notable for the following components:      Result Value   Hgb A1c MFr Bld 6.7 (*)    All other components within normal limits  COMPREHENSIVE METABOLIC PANEL - Abnormal; Notable for the following components:   Glucose, Bld 113 (*)    ALT 48 (*)    All other components  within normal limits  GLUCOSE, CAPILLARY - Abnormal; Notable for the following components:   Glucose-Capillary 181 (*)    All other components within normal limits  SURGICAL PCR SCREEN  APTT  CBC WITH DIFFERENTIAL/PLATELET  PROTIME-INR  TYPE AND SCREEN  ABO/RH     IMAGES:   EKG: 03/26/2019 Rate 57 bpm Sinus bradycardia Consider old anterior infarct   CV: Echo 08/10/2018 IMPRESSIONS    1. The left ventricle has normal systolic function of 55-60%. The cavity size was normal. There is no increased left ventricular wall thickness. Echo evidence of impaired diastolic relaxation and indeterminate left ventricular filling pressure.  2. The right ventricle has normal systolic function. The cavity was normal. There is no increase in right ventricular wall thickness.  3. The mitral valve is normal in structure.  4. The tricuspid valve is normal in structure.  5. The aortic valve is normal in structure.  6. The pulmonic valve was normal in structure.  Cardiac Cath 07/13/2018  Prox LAD lesion is 25% stenosed.  Mid LAD lesion is 25% stenosed.  The left ventricular systolic function is normal.  The left ventricular ejection fraction is 55-65% by visual estimate.  LV end diastolic pressure is mildly elevated.  There is no aortic valve stenosis.  Tortuous right subclavian makes catheter torquing difficult. If emergency cath was needed in the future, would consider different approach rather  than right radial artery.   Nonobstructive CAD.  Continue preventive therapy.  Myocardial Perfusion 03/05/2009 Impression -A mild perfusion defect is seen in the apical inferior region(s).  This is consistent with an attenuation artifact and is unlikely to represent significant ischemia.  Clnical correlation is recommended.   The post-stress ejection fraction is 62%.  No regional wall motion abnormalities.  Normal max effort GTX Exercise capacity 10.0 METS Abnormal Myocardial Perfusion  Study.  This was essentially a low risk scan.  Past Medical History:  Diagnosis Date  . Arthritis   . Complication of anesthesia    one surgery pt. states he was hard to wake up  . Diabetes mellitus without complication ([place])    type 2  . GERD (gastroesophageal reflux disease)   . HTN (hypertension)   . 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 Obesity   . OSA (obstructive sleep apnea)    uses CPAP  . PONV (postoperative nausea and vomiting)     Past Surgical History:  Procedure Laterality Date  . BACK SURGERY  08/2015  . COLONOSCOPY    . ESOPHAGEAL DILATION     1 or 2 times  . ESOPHAGOGASTRODUODENOSCOPY N/A 10/30/2013   Procedure: ESOPHAGOGASTRODUODENOSCOPY (EGD);  Surgeon: Garlan Fair, MD;  Location: [facility];  Service: Endoscopy;  Laterality: N/A;  . JOINT REPLACEMENT     bilateral knees  . LEFT HEART CATH AND CORONARY ANGIOGRAPHY N/A 07/13/2018   Procedure: LEFT HEART CATH AND CORONARY ANGIOGRAPHY;  Surgeon: Jettie Booze, MD;  Location: [facility];  Service: Cardiovascular;  Laterality: N/A;  . SHOULDER ARTHROSCOPY     Bilateral 3 times each shoulder    MEDICATIONS: . albuterol (PROVENTIL HFA;VENTOLIN HFA) 108 (90 Base) MCG/ACT inhaler  . budesonide-formoterol (SYMBICORT) 160-4.5 MCG/ACT inhaler  . hydrochlorothiazide (HYDRODIURIL) 25 MG tablet  . metFORMIN (GLUCOPHAGE) 500 MG tablet  . nitroGLYCERIN (NITROSTAT) 0.4 MG SL tablet   No current facility-administered medications for this encounter.     Maia Plan [facility]Surgical Testing [phone] 04/12/19  2:20 PM

## 2019-04-12 NOTE — Anesthesia Preprocedure Evaluation (Addendum)
Anesthesia Evaluation  Patient identified by MRN, date of birth, ID band Patient awake    Reviewed: Allergy & Precautions, NPO status , Patient's Chart, lab work & pertinent test results  History of Anesthesia Complications (+) PONV and history of anesthetic complications  Airway Mallampati: IV  TM Distance: >3 FB Neck ROM: Full  Mouth opening: Limited Mouth Opening  Dental no notable dental hx. (+) Teeth Intact, Dental Advisory Given   Pulmonary shortness of breath, sleep apnea and Continuous Positive Airway Pressure Ventilation ,    Pulmonary exam normal breath sounds clear to auscultation       Cardiovascular hypertension, + CAD  Normal cardiovascular exam Rhythm:Regular Rate:Normal  EKG: 03/26/2019 Rate 57 bpm Sinus bradycardia Consider old anterior infarct  Echo 08/10/2018 1. The left ventricle has normal systolic function of 01-77%. The cavity size was normal. There is no increased left ventricular wall thickness. Echo evidence of impaired diastolic relaxation and indeterminate left ventricular filling pressure. 2. The right ventricle has normal systolic function. The cavity was normal. There is no increase in right ventricular wall thickness. 3. The mitral valve is normal in structure. 4. The tricuspid valve is normal in structure. 5. The aortic valve is normal in structure. 6. The pulmonic valve was normal in structure.  Cardiac Cath 07/13/2018  Prox LAD lesion is 25% stenosed.  Mid LAD lesion is 25% stenosed.  The left ventricular systolic function is normal.  The left ventricular ejection fraction is 55-65% by visual estimate.  LV end diastolic pressure is mildly elevated.  There is no aortic valve stenosis.  Tortuous right subclavian makes catheter torquing difficult. If emergency cath was needed in the future, would consider different approach rather than right radial artery. Nonobstructive CAD.  Continue preventive therapy.  Myocardial Perfusion 03/05/2009 Impression -A mild perfusion defect is seen in the apical inferior region(s).  This is consistent with an attenuation artifact and is unlikely to represent significant ischemia.  Clnical correlation is recommended.   The post-stress ejection fraction is 62%.  No regional wall motion abnormalities.  Normal max effort GTX Exercise capacity 10.0 METS Abnormal Myocardial Perfusion Study.  This was essentially a low risk scan.    Neuro/Psych negative neurological ROS  negative psych ROS   GI/Hepatic Neg liver ROS, GERD  ,  Endo/Other  negative endocrine ROSdiabetes, Oral Hypoglycemic Agents  Renal/GU negative Renal ROS  negative genitourinary   Musculoskeletal  (+) Arthritis ,   Abdominal   Peds  Hematology negative hematology ROS (+)   Anesthesia Other Findings   Reproductive/Obstetrics                           Anesthesia Physical Anesthesia Plan  ASA: III  Anesthesia Plan: Spinal   Post-op Pain Management:    Induction:   PONV Risk Score and Plan: 2 and Treatment may vary due to age or medical condition and Propofol infusion  Airway Management Planned: Natural Airway  Additional Equipment:   Intra-op Plan:   Post-operative Plan:   Informed Consent: I have reviewed the patients History and Physical, chart, labs and discussed the procedure including the risks, benefits and alternatives for the proposed anesthesia with the patient or authorized representative who has indicated his/her understanding and acceptance.     Dental advisory given  Plan Discussed with: CRNA  Anesthesia Plan Comments: (See PAT note 04/11/2019, Konrad Felix, PA-C)       Anesthesia Quick Evaluation

## 2019-04-12 NOTE — H&P (Signed)
TOTAL HIP ADMISSION H&P  Patient is admitted for left total hip arthroplasty.  Subjective:  Chief Complaint: left hip pain  HPI: Jordan Mcconnell, 63 y.o. male, has a history of pain and functional disability in the left hip(s) due to arthritis and patient has failed non-surgical conservative treatments for greater than 12 weeks to include NSAID's and/or analgesics, corticosteriod injections, weight reduction as appropriate and activity modification.  Onset of symptoms was gradual starting 3 years ago with gradually worsening course since that time.The patient noted no past surgery on the left hip(s).  Patient currently rates pain in the left hip at 7 out of 10 with activity. Patient has night pain, worsening of pain with activity and weight bearing, pain that interfers with activities of daily living and pain with passive range of motion. Patient has evidence of periarticular osteophytes and joint space narrowing by imaging studies. This condition presents safety issues increasing the risk of falls. There is no current active infection.  Patient Active Problem List   Diagnosis Date Noted  . Dysphagia 09/18/2018  . Lung nodules 09/18/2018  . Chest pain 07/11/2018  . Dyspnea 06/21/2018  . Physical exam 07/22/2017  . Adjustment disorder 03/17/2017  . Diabetes mellitus, type II ([place]) 07/29/2016  . Chronic cough 07/07/2016  . 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 Gastroesophageal reflux disease 05/12/2016  . Back pain 08/06/2015  . OSA (obstructive sleep apnea)   . HTN (hypertension)   . Morbid obesity ([place])    Past Medical History:  Diagnosis Date  . Arthritis   . Complication of anesthesia    one surgery pt. states he was hard to wake up  . Diabetes mellitus without complication ([place])    type 2  . GERD (gastroesophageal reflux disease)   . HTN (hypertension)   . 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 Obesity   . OSA (obstructive sleep apnea)    uses  CPAP  . PONV (postoperative nausea and vomiting)     Past Surgical History:  Procedure Laterality Date  . BACK SURGERY  08/2015  . COLONOSCOPY    . ESOPHAGEAL DILATION     1 or 2 times  . ESOPHAGOGASTRODUODENOSCOPY N/A 10/30/2013   Procedure: ESOPHAGOGASTRODUODENOSCOPY (EGD);  Surgeon: Garlan Fair, MD;  Location: [facility];  Service: Endoscopy;  Laterality: N/A;  . JOINT REPLACEMENT     bilateral knees  . LEFT HEART CATH AND CORONARY ANGIOGRAPHY N/A 07/13/2018   Procedure: LEFT HEART CATH AND CORONARY ANGIOGRAPHY;  Surgeon: Jettie Booze, MD;  Location: [facility];  Service: Cardiovascular;  Laterality: N/A;  . SHOULDER ARTHROSCOPY     Bilateral 3 times each shoulder       Current Outpatient Medications  Medication Sig Dispense Refill Last Dose  . albuterol (PROVENTIL HFA;VENTOLIN HFA) 108 (90 Base) MCG/ACT inhaler Inhale 2 puffs into the lungs every 4 (four) hours as needed for wheezing or shortness of breath. 1 Inhaler 6   . budesonide-formoterol (SYMBICORT) 160-4.5 MCG/ACT inhaler Inhale 2 puffs into the lungs 2 (two) times a day. 1-2 puffs twice daily 1 Inhaler 6   . hydrochlorothiazide (HYDRODIURIL) 25 MG tablet TAKE 1 TABLET BY MOUTH EVERY DAY *INS ALLOWS 30 DAYS (Patient taking differently: Take 25 mg by mouth daily. ) 30 tablet 1   . metFORMIN (GLUCOPHAGE) 500 MG tablet TAKE 1 TABLET BY MOUTH TWICE A DAY WITH FOOD (Patient taking differently: Take 500 mg by mouth 2 (two) times daily with a meal. )  60 tablet 6   . nitroGLYCERIN (NITROSTAT) 0.4 MG SL tablet Place 1 tablet (0.4 mg total) under the tongue every 5 (five) minutes as needed for chest pain. 90 tablet 3    Allergies  Allergen Reactions  . Penicillins Rash    DID THE REACTION INVOLVE: Swelling of the face/tongue/throat, SOB, or low BP? No Sudden or severe rash/hives, skin peeling, or the inside of the mouth or nose? Yes Did it require medical treatment? Yes When did it last happen?childhood  allergy If all above answers are "NO", may proceed with cephalosporin use.    . Sulfa Antibiotics Rash  . Codeine Nausea Only  . Tramadol Nausea Only    Severe dizziness    Social History   Tobacco Use  . Smoking status: Never Smoker  . Smokeless tobacco: Never Used  Substance Use Topics  . Alcohol use: No    Family History  Problem Relation Age of Onset  . Memory loss Mother   . Heart murmur Mother   . Colon cancer Father   . Stroke Father   . Cancer Father        colon  . Dementia Father   . Cancer Maternal Grandfather        lung and bone  . Memory loss Maternal Grandmother      Review of Systems  Constitutional: Negative.   HENT: Negative.   Eyes: Negative.   Respiratory: Positive for shortness of breath. Negative for cough, hemoptysis, sputum production and wheezing.        SOB with exertion  Cardiovascular: Negative.   Gastrointestinal: Negative.   Genitourinary: Negative.   Musculoskeletal: Positive for joint pain and myalgias. Negative for back pain, falls and neck pain.  Skin: Negative.   Neurological: Negative.   Endo/Heme/Allergies: Negative.   Psychiatric/Behavioral: Negative.     Objective:  Physical Exam  Constitutional: He is oriented to person, place, and time. He appears well-developed. No distress.  Morbidly obese  HENT:  Head: Normocephalic and atraumatic.  Right Ear: External ear normal.  Left Ear: External ear normal.  Nose: Nose normal.  Mouth/Throat: Oropharynx is clear and moist.  Eyes: Conjunctivae and EOM are normal.  Neck: Normal range of motion. Neck supple.  Cardiovascular: Normal rate, regular rhythm, normal heart sounds and intact distal pulses.  No murmur heard. Respiratory: Effort normal and breath sounds normal. No respiratory distress. He has no wheezes.  GI: Soft. Bowel sounds are normal. He exhibits no distension. There is no abdominal tenderness.  Musculoskeletal:     Comments: Significantly antalgic gait pattern  favoring the left side without the use of assisted devices.  Right Hip Exam: ROM: Normal without discomfort. There is no tenderness over the greater trochanter bursa.  Left Hip Exam: ROM: Flexion to 100, Internal Rotation 0, External Rotation 20, and Abduction 20 degrees. There is no tenderness over the greater trochanter bursa.  Neurological: He is alert and oriented to person, place, and time. He has normal strength. No sensory deficit.  Skin: No rash noted. He is not diaphoretic. No erythema.  Psychiatric: He has a normal mood and affect. His behavior is normal.    Vitals Ht: 5 ft 9.5 in Wt: 262 lbs  BMI: 38.1 BP: 122/68 sitting R arm  Pulse: 72 bpm   Imaging Review Plain radiographs demonstrate severe degenerative joint disease of the left hip(s). The bone quality appears to be good for age and reported activity level.    Assessment/Plan:  End stage primary osteoarthritis, left  hip(s)  The patient history, physical examination, clinical judgement of the provider and imaging studies are consistent with end stage degenerative joint disease of the left hip(s) and total hip arthroplasty is deemed medically necessary. The treatment options including medical management, injection therapy, arthroscopy and arthroplasty were discussed at length. The risks and benefits of total hip arthroplasty were presented and reviewed. The risks due to aseptic loosening, infection, stiffness, dislocation/subluxation,  thromboembolic complications and other imponderables were discussed.  The patient acknowledged the explanation, agreed to proceed with the plan and consent was signed. Patient is being admitted for inpatient treatment for surgery, pain control, PT, OT, prophylactic antibiotics, VTE prophylaxis, progressive ambulation and ADL's and discharge planning.The patient is planning to be discharged home.    Risks and benefits of the surgery were discussed with the patient and Dr.Aluisio at their  previous office visit, and the patient has elected to move forward with the aforementioned surgery. Post-operative care plans were discussed with the patient today.  Therapy Plans: HEP Disposition: Home with wife Planned DVT prophylaxis: aspirin 325mg  BID DME needed: has equipment PCP: Dr. Cardio: Dr. Bosie Clos Other: lung nodules need recheck in February  Instructed patient on meds to stop prior to surgery   March, PA-C

## 2019-04-13 LAB — NOVEL CORONAVIRUS, NAA (HOSP ORDER, SEND-OUT TO REF LAB; TAT 18-24 HRS): SARS-CoV-2, NAA: NOT DETECTED

## 2019-04-13 NOTE — Progress Notes (Signed)
Pt advised that his scheduled surgery on 04-16-19 has  changed from 3:05 PM to 1:45 PM. Pt to arrived at admitting at 11:15 AM, and to complete Ensure Supplement by 10:45 AM. Pt verbalized understanding.

## 2019-04-15 MED ORDER — VANCOMYCIN HCL 10 G IV SOLR
1500.0000 mg | INTRAVENOUS | Status: AC
  Administered 2019-04-16: 1500 mg via INTRAVENOUS
  Filled 2019-04-15: qty 1500

## 2019-04-16 ENCOUNTER — Other Ambulatory Visit: Payer: Self-pay

## 2019-04-16 ENCOUNTER — Inpatient Hospital Stay (HOSPITAL_COMMUNITY): Payer: PRIVATE HEALTH INSURANCE | Admitting: Physician Assistant

## 2019-04-16 ENCOUNTER — Inpatient Hospital Stay (HOSPITAL_COMMUNITY): Payer: PRIVATE HEALTH INSURANCE | Admitting: Anesthesiology

## 2019-04-16 ENCOUNTER — Encounter (HOSPITAL_COMMUNITY)
Admission: RE | Disposition: A | Discharge status: Home / Self Care | Payer: Self-pay | Source: Home / Self Care | Attending: Orthopedic Surgery

## 2019-04-16 ENCOUNTER — Inpatient Hospital Stay (HOSPITAL_COMMUNITY)
Admission: RE | Admit: 2019-04-16 | Discharge: 2019-04-17 | DRG: 470 | Disposition: A | Discharge status: Home / Self Care | Payer: PRIVATE HEALTH INSURANCE | Attending: Orthopedic Surgery | Admitting: Orthopedic Surgery

## 2019-04-16 ENCOUNTER — Inpatient Hospital Stay (HOSPITAL_COMMUNITY): Payer: PRIVATE HEALTH INSURANCE

## 2019-04-16 ENCOUNTER — Encounter (HOSPITAL_COMMUNITY): Payer: Self-pay | Admitting: *Deleted

## 2019-04-16 DIAGNOSIS — Z419 Encounter for procedure for purposes other than remedying health state, unspecified: Secondary | ICD-10-CM

## 2019-04-16 DIAGNOSIS — Z79899 Other long term (current) drug therapy: Secondary | ICD-10-CM

## 2019-04-16 DIAGNOSIS — Z7984 Long term (current) use of oral hypoglycemic drugs: Secondary | ICD-10-CM

## 2019-04-16 DIAGNOSIS — R1310 Dysphagia, unspecified: Secondary | ICD-10-CM | POA: Diagnosis present

## 2019-04-16 DIAGNOSIS — E119 Type 2 diabetes mellitus without complications: Secondary | ICD-10-CM | POA: Diagnosis present

## 2019-04-16 DIAGNOSIS — Z96649 Presence of unspecified artificial hip joint: Secondary | ICD-10-CM

## 2019-04-16 DIAGNOSIS — G4733 Obstructive sleep apnea (adult) (pediatric): Secondary | ICD-10-CM | POA: Diagnosis present

## 2019-04-16 DIAGNOSIS — Z888 Allergy status to other drugs, medicaments and biological substances status: Secondary | ICD-10-CM | POA: Diagnosis not present

## 2019-04-16 DIAGNOSIS — Z6836 Body mass index (BMI) 36.0-36.9, adult: Secondary | ICD-10-CM | POA: Diagnosis not present

## 2019-04-16 DIAGNOSIS — Z20828 Contact with and (suspected) exposure to other viral communicable diseases: Secondary | ICD-10-CM | POA: Diagnosis present

## 2019-04-16 DIAGNOSIS — I2510 Atherosclerotic heart disease of native coronary artery without angina pectoris: Secondary | ICD-10-CM | POA: Diagnosis present

## 2019-04-16 DIAGNOSIS — I10 Essential (primary) hypertension: Secondary | ICD-10-CM | POA: Diagnosis present

## 2019-04-16 DIAGNOSIS — R918 Other nonspecific abnormal finding of lung field: Secondary | ICD-10-CM | POA: Diagnosis present

## 2019-04-16 DIAGNOSIS — F4320 Adjustment disorder, unspecified: Secondary | ICD-10-CM | POA: Diagnosis present

## 2019-04-16 DIAGNOSIS — K219 Gastro-esophageal reflux disease without esophagitis: Secondary | ICD-10-CM | POA: Diagnosis present

## 2019-04-16 DIAGNOSIS — Z7951 Long term (current) use of inhaled steroids: Secondary | ICD-10-CM

## 2019-04-16 DIAGNOSIS — Z885 Allergy status to narcotic agent status: Secondary | ICD-10-CM | POA: Diagnosis not present

## 2019-04-16 DIAGNOSIS — Z880 Allergy status to penicillin: Secondary | ICD-10-CM

## 2019-04-16 DIAGNOSIS — Z882 Allergy status to sulfonamides status: Secondary | ICD-10-CM | POA: Diagnosis not present

## 2019-04-16 DIAGNOSIS — Z96653 Presence of artificial knee joint, bilateral: Secondary | ICD-10-CM | POA: Diagnosis present

## 2019-04-16 DIAGNOSIS — M25752 Osteophyte, left hip: Secondary | ICD-10-CM | POA: Diagnosis present

## 2019-04-16 DIAGNOSIS — M169 Osteoarthritis of hip, unspecified: Secondary | ICD-10-CM | POA: Diagnosis present

## 2019-04-16 DIAGNOSIS — M1612 Unilateral primary osteoarthritis, left hip: Principal | ICD-10-CM | POA: Diagnosis present

## 2019-04-16 DIAGNOSIS — K580 Irritable bowel syndrome with diarrhea: Secondary | ICD-10-CM | POA: Diagnosis present

## 2019-04-16 HISTORY — PX: TOTAL HIP ARTHROPLASTY: SHX124

## 2019-04-16 LAB — TYPE AND SCREEN
ABO/RH(D): O POS
Antibody Screen: NEGATIVE

## 2019-04-16 LAB — GLUCOSE, CAPILLARY
Glucose-Capillary: 116 mg/dL — ABNORMAL HIGH (ref 70–99)
Glucose-Capillary: 100 mg/dL — ABNORMAL HIGH (ref 70–99)
Glucose-Capillary: 175 mg/dL — ABNORMAL HIGH (ref 70–99)

## 2019-04-16 SURGERY — ARTHROPLASTY, HIP, TOTAL, ANTERIOR APPROACH
Anesthesia: Spinal | Site: Hip | Laterality: Left

## 2019-04-16 MED ORDER — FENTANYL CITRATE (PF) 100 MCG/2ML IJ SOLN
INTRAMUSCULAR | Status: AC
  Filled 2019-04-16: qty 2

## 2019-04-16 MED ORDER — MIDAZOLAM HCL 5 MG/5ML IJ SOLN
INTRAMUSCULAR | Status: DC | PRN
  Administered 2019-04-16: 2 mg via INTRAVENOUS

## 2019-04-16 MED ORDER — DEXAMETHASONE SODIUM PHOSPHATE 10 MG/ML IJ SOLN: 8.0000 mg | Freq: Once | INTRAMUSCULAR | Status: DC

## 2019-04-16 MED ORDER — PROPOFOL 500 MG/50ML IV EMUL
INTRAVENOUS | Status: AC
  Filled 2019-04-16: qty 150

## 2019-04-16 MED ORDER — METHOCARBAMOL 500 MG PO TABS
500.0000 mg | ORAL_TABLET | Freq: Four times a day (QID) | ORAL | Status: DC | PRN
  Administered 2019-04-16 – 2019-04-17 (×2): 500 mg via ORAL
  Filled 2019-04-16: qty 1

## 2019-04-16 MED ORDER — INSULIN ASPART 100 UNIT/ML ~~LOC~~ SOLN
0.0000 [IU] | Freq: Three times a day (TID) | SUBCUTANEOUS | Status: DC
  Administered 2019-04-17: 2 [IU] via SUBCUTANEOUS
  Administered 2019-04-17: 3 [IU] via SUBCUTANEOUS

## 2019-04-16 MED ORDER — METOCLOPRAMIDE HCL 5 MG/ML IJ SOLN: 5.0000 mg | Freq: Three times a day (TID) | INTRAMUSCULAR | Status: DC | PRN

## 2019-04-16 MED ORDER — HYDROMORPHONE HCL 2 MG PO TABS
2.0000 mg | ORAL_TABLET | ORAL | Status: DC | PRN
  Administered 2019-04-16 (×2): 2 mg via ORAL
  Filled 2019-04-16: qty 1

## 2019-04-16 MED ORDER — PROPOFOL 10 MG/ML IV BOLUS
INTRAVENOUS | Status: AC
  Filled 2019-04-16: qty 20

## 2019-04-16 MED ORDER — MIDAZOLAM HCL 2 MG/2ML IJ SOLN
INTRAMUSCULAR | Status: AC
  Filled 2019-04-16: qty 2

## 2019-04-16 MED ORDER — TRANEXAMIC ACID-NACL 1000-0.7 MG/100ML-% IV SOLN
1000.0000 mg | INTRAVENOUS | Status: AC
  Administered 2019-04-16: 1000 mg via INTRAVENOUS
  Filled 2019-04-16: qty 100

## 2019-04-16 MED ORDER — WATER FOR IRRIGATION, STERILE IR SOLN
Status: DC | PRN
  Administered 2019-04-16: 2000 mL

## 2019-04-16 MED ORDER — MOMETASONE FURO-FORMOTEROL FUM 200-5 MCG/ACT IN AERO
2.0000 | INHALATION_SPRAY | Freq: Two times a day (BID) | RESPIRATORY_TRACT | Status: DC
  Administered 2019-04-16 – 2019-04-17 (×2): 2 via RESPIRATORY_TRACT
  Filled 2019-04-16 (×2): qty 8.8

## 2019-04-16 MED ORDER — DEXAMETHASONE SODIUM PHOSPHATE 10 MG/ML IJ SOLN
INTRAMUSCULAR | Status: AC
  Filled 2019-04-16: qty 1

## 2019-04-16 MED ORDER — ONDANSETRON HCL 4 MG PO TABS: 4.0000 mg | ORAL_TABLET | Freq: Four times a day (QID) | ORAL | Status: DC | PRN

## 2019-04-16 MED ORDER — ACETAMINOPHEN 10 MG/ML IV SOLN
1000.0000 mg | Freq: Four times a day (QID) | INTRAVENOUS | Status: DC
  Administered 2019-04-16: 1000 mg via INTRAVENOUS
  Filled 2019-04-16: qty 100

## 2019-04-16 MED ORDER — 0.9 % SODIUM CHLORIDE (POUR BTL) OPTIME
TOPICAL | Status: DC | PRN
  Administered 2019-04-16: 1000 mL

## 2019-04-16 MED ORDER — METHOCARBAMOL 500 MG IVPB - SIMPLE MED
500.0000 mg | Freq: Four times a day (QID) | INTRAVENOUS | Status: DC | PRN
  Administered 2019-04-16: 500 mg via INTRAVENOUS
  Filled 2019-04-16: qty 50

## 2019-04-16 MED ORDER — ONDANSETRON HCL 4 MG/2ML IJ SOLN: 4.0000 mg | Freq: Four times a day (QID) | INTRAMUSCULAR | Status: DC | PRN

## 2019-04-16 MED ORDER — MORPHINE SULFATE (PF) 2 MG/ML IV SOLN: 0.5000 mg | INTRAVENOUS | Status: DC | PRN

## 2019-04-16 MED ORDER — FLEET ENEMA 7-19 GM/118ML RE ENEM: 1.0000 | ENEMA | Freq: Once | RECTAL | Status: DC | PRN

## 2019-04-16 MED ORDER — HYDROCHLOROTHIAZIDE 25 MG PO TABS
25.0000 mg | ORAL_TABLET | Freq: Every day | ORAL | Status: DC
  Administered 2019-04-17: 25 mg via ORAL
  Filled 2019-04-16: qty 1

## 2019-04-16 MED ORDER — EPHEDRINE SULFATE-NACL 50-0.9 MG/10ML-% IV SOSY
PREFILLED_SYRINGE | INTRAVENOUS | Status: DC | PRN
  Administered 2019-04-16: 5 mg via INTRAVENOUS

## 2019-04-16 MED ORDER — METHOCARBAMOL 500 MG IVPB - SIMPLE MED
INTRAVENOUS | Status: AC
  Filled 2019-04-16: qty 50

## 2019-04-16 MED ORDER — DEXAMETHASONE SODIUM PHOSPHATE 10 MG/ML IJ SOLN
10.0000 mg | Freq: Once | INTRAMUSCULAR | Status: AC
  Administered 2019-04-17: 10 mg via INTRAVENOUS
  Filled 2019-04-16: qty 1

## 2019-04-16 MED ORDER — BUPIVACAINE HCL (PF) 0.75 % IJ SOLN
INTRAMUSCULAR | Status: DC | PRN
  Administered 2019-04-16: 2 mL via INTRATHECAL

## 2019-04-16 MED ORDER — ONDANSETRON HCL 4 MG/2ML IJ SOLN
INTRAMUSCULAR | Status: AC
  Filled 2019-04-16: qty 2

## 2019-04-16 MED ORDER — POLYETHYLENE GLYCOL 3350 17 G PO PACK: 17.0000 g | PACK | Freq: Every day | ORAL | Status: DC | PRN

## 2019-04-16 MED ORDER — POVIDONE-IODINE 10 % EX SWAB
2.0000 "application " | Freq: Once | CUTANEOUS | Status: AC
  Administered 2019-04-16: 2 via TOPICAL

## 2019-04-16 MED ORDER — FENTANYL CITRATE (PF) 100 MCG/2ML IJ SOLN
INTRAMUSCULAR | Status: DC | PRN
  Administered 2019-04-16 (×2): 25 ug via INTRAVENOUS
  Administered 2019-04-16: 50 ug via INTRAVENOUS

## 2019-04-16 MED ORDER — PROPOFOL 10 MG/ML IV BOLUS
INTRAVENOUS | Status: DC | PRN
  Administered 2019-04-16 (×2): 20 mg via INTRAVENOUS

## 2019-04-16 MED ORDER — PHENYLEPHRINE 40 MCG/ML (10ML) SYRINGE FOR IV PUSH (FOR BLOOD PRESSURE SUPPORT)
PREFILLED_SYRINGE | INTRAVENOUS | Status: DC | PRN
  Administered 2019-04-16 (×2): 80 ug via INTRAVENOUS

## 2019-04-16 MED ORDER — BUPIVACAINE HCL 0.25 % IJ SOLN
INTRAMUSCULAR | Status: AC
  Filled 2019-04-16: qty 1

## 2019-04-16 MED ORDER — LACTATED RINGERS IV SOLN
INTRAVENOUS | Status: DC
  Administered 2019-04-16 (×2): via INTRAVENOUS

## 2019-04-16 MED ORDER — SODIUM CHLORIDE 0.9 % IV SOLN
INTRAVENOUS | Status: DC
  Administered 2019-04-16 – 2019-04-17 (×2): via INTRAVENOUS

## 2019-04-16 MED ORDER — PHENOL 1.4 % MT LIQD: 1.0000 | OROMUCOSAL | Status: DC | PRN

## 2019-04-16 MED ORDER — DIPHENHYDRAMINE HCL 12.5 MG/5ML PO ELIX: 12.5000 mg | ORAL_SOLUTION | ORAL | Status: DC | PRN

## 2019-04-16 MED ORDER — PHENYLEPHRINE 40 MCG/ML (10ML) SYRINGE FOR IV PUSH (FOR BLOOD PRESSURE SUPPORT)
PREFILLED_SYRINGE | INTRAVENOUS | Status: AC
  Filled 2019-04-16: qty 10

## 2019-04-16 MED ORDER — ASPIRIN EC 325 MG PO TBEC
325.0000 mg | DELAYED_RELEASE_TABLET | Freq: Two times a day (BID) | ORAL | Status: DC
  Administered 2019-04-17: 325 mg via ORAL
  Filled 2019-04-16: qty 1

## 2019-04-16 MED ORDER — MENTHOL 3 MG MT LOZG: 1.0000 | LOZENGE | OROMUCOSAL | Status: DC | PRN

## 2019-04-16 MED ORDER — NITROGLYCERIN 0.4 MG SL SUBL: 0.4000 mg | SUBLINGUAL_TABLET | SUBLINGUAL | Status: DC | PRN

## 2019-04-16 MED ORDER — PROPOFOL 500 MG/50ML IV EMUL
INTRAVENOUS | Status: DC | PRN
  Administered 2019-04-16: 75 ug/kg/min via INTRAVENOUS

## 2019-04-16 MED ORDER — ALBUTEROL SULFATE HFA 108 (90 BASE) MCG/ACT IN AERS: 2.0000 | INHALATION_SPRAY | RESPIRATORY_TRACT | Status: DC | PRN

## 2019-04-16 MED ORDER — ACETAMINOPHEN 500 MG PO TABS
500.0000 mg | ORAL_TABLET | Freq: Four times a day (QID) | ORAL | Status: AC
  Administered 2019-04-16 – 2019-04-17 (×4): 500 mg via ORAL
  Filled 2019-04-16 (×4): qty 1

## 2019-04-16 MED ORDER — CHLORHEXIDINE GLUCONATE 4 % EX LIQD
60.0000 mL | Freq: Once | CUTANEOUS | Status: AC
  Administered 2019-04-16: 4 via TOPICAL

## 2019-04-16 MED ORDER — FENTANYL CITRATE (PF) 100 MCG/2ML IJ SOLN
25.0000 ug | INTRAMUSCULAR | Status: DC | PRN
  Administered 2019-04-16: 50 ug via INTRAVENOUS

## 2019-04-16 MED ORDER — DOCUSATE SODIUM 100 MG PO CAPS
100.0000 mg | ORAL_CAPSULE | Freq: Two times a day (BID) | ORAL | Status: DC
  Administered 2019-04-16 – 2019-04-17 (×2): 100 mg via ORAL
  Filled 2019-04-16 (×2): qty 1

## 2019-04-16 MED ORDER — VANCOMYCIN HCL IN DEXTROSE 1-5 GM/200ML-% IV SOLN
1000.0000 mg | Freq: Two times a day (BID) | INTRAVENOUS | Status: AC
  Administered 2019-04-17: 1000 mg via INTRAVENOUS
  Filled 2019-04-16: qty 200

## 2019-04-16 MED ORDER — EPHEDRINE 5 MG/ML INJ
INTRAVENOUS | Status: AC
  Filled 2019-04-16: qty 10

## 2019-04-16 MED ORDER — BUPIVACAINE HCL 0.25 % IJ SOLN
INTRAMUSCULAR | Status: DC | PRN
  Administered 2019-04-16: 30 mL

## 2019-04-16 MED ORDER — METOCLOPRAMIDE HCL 5 MG PO TABS: 5.0000 mg | ORAL_TABLET | Freq: Three times a day (TID) | ORAL | Status: DC | PRN

## 2019-04-16 MED ORDER — HYDROMORPHONE HCL 2 MG PO TABS
4.0000 mg | ORAL_TABLET | ORAL | Status: DC | PRN
  Administered 2019-04-17 (×4): 4 mg via ORAL
  Filled 2019-04-16 (×5): qty 2

## 2019-04-16 MED ORDER — BISACODYL 10 MG RE SUPP: 10.0000 mg | Freq: Every day | RECTAL | Status: DC | PRN

## 2019-04-16 SURGICAL SUPPLY — 48 items
BAG DECANTER FOR FLEXI CONT (MISCELLANEOUS) IMPLANT
BAG SPEC THK2 15X12 ZIP CLS (MISCELLANEOUS)
BAG ZIPLOCK 12X15 (MISCELLANEOUS) IMPLANT
BLADE SAG 18X100X1.27 (BLADE) ×3 IMPLANT
CLOSURE STERI-STRIP 1/2X4 (GAUZE/BANDAGES/DRESSINGS) ×2
CLOSURE WOUND 1/2 X4 (GAUZE/BANDAGES/DRESSINGS) ×1
CLSR STERI-STRIP ANTIMIC 1/2X4 (GAUZE/BANDAGES/DRESSINGS) ×2 IMPLANT
COVER PERINEAL POST (MISCELLANEOUS) ×3 IMPLANT
COVER SURGICAL LIGHT HANDLE (MISCELLANEOUS) ×3 IMPLANT
COVER WAND RF STERILE (DRAPES) ×2 IMPLANT
CUP ACETBLR 52 OD PINNACLE (Hips) ×2 IMPLANT
DECANTER SPIKE VIAL GLASS SM (MISCELLANEOUS) ×3 IMPLANT
DRAPE STERI IOBAN 125X83 (DRAPES) ×3 IMPLANT
DRAPE U-SHAPE 47X51 STRL (DRAPES) ×6 IMPLANT
DRSG ADAPTIC 3X8 NADH LF (GAUZE/BANDAGES/DRESSINGS) ×3 IMPLANT
DRSG MEPILEX BORDER 4X4 (GAUZE/BANDAGES/DRESSINGS) ×3 IMPLANT
DRSG MEPILEX BORDER 4X8 (GAUZE/BANDAGES/DRESSINGS) ×3 IMPLANT
DURAPREP 26ML APPLICATOR (WOUND CARE) ×3 IMPLANT
ELECT REM PT RETURN 15FT ADLT (MISCELLANEOUS) ×3 IMPLANT
EVACUATOR 1/8 PVC DRAIN (DRAIN) ×3 IMPLANT
GLOVE BIO SURGEON STRL SZ 6 (GLOVE) ×2 IMPLANT
GLOVE BIO SURGEON STRL SZ7 (GLOVE) IMPLANT
GLOVE BIO SURGEON STRL SZ8 (GLOVE) ×3 IMPLANT
GLOVE BIOGEL PI IND STRL 6.5 (GLOVE) IMPLANT
GLOVE BIOGEL PI IND STRL 7.0 (GLOVE) IMPLANT
GLOVE BIOGEL PI IND STRL 8 (GLOVE) ×1 IMPLANT
GLOVE BIOGEL PI INDICATOR 6.5 (GLOVE) ×2
GLOVE BIOGEL PI INDICATOR 7.0 (GLOVE)
GLOVE BIOGEL PI INDICATOR 8 (GLOVE) ×2
GOWN STRL REUS W/TWL LRG LVL3 (GOWN DISPOSABLE) ×3 IMPLANT
GOWN STRL REUS W/TWL XL LVL3 (GOWN DISPOSABLE) IMPLANT
HEAD FEMORAL 32 CERAMIC (Hips) ×2 IMPLANT
HOLDER FOLEY CATH W/STRAP (MISCELLANEOUS) ×3 IMPLANT
KIT TURNOVER KIT A (KITS) IMPLANT
LINER MARATHON NEUT +4X52X32 (Hips) ×2 IMPLANT
MANIFOLD NEPTUNE II (INSTRUMENTS) ×3 IMPLANT
PACK ANTERIOR HIP CUSTOM (KITS) ×3 IMPLANT
STEM FEM ACTIS HIGH SZ7 (Stem) ×2 IMPLANT
STRIP CLOSURE SKIN 1/2X4 (GAUZE/BANDAGES/DRESSINGS) ×2 IMPLANT
SUT ETHIBOND NAB CT1 #1 30IN (SUTURE) ×3 IMPLANT
SUT MNCRL AB 4-0 PS2 18 (SUTURE) ×3 IMPLANT
SUT STRATAFIX 0 PDS 27 VIOLET (SUTURE) ×3
SUT VIC AB 2-0 CT1 27 (SUTURE) ×6
SUT VIC AB 2-0 CT1 TAPERPNT 27 (SUTURE) ×2 IMPLANT
SUTURE STRATFX 0 PDS 27 VIOLET (SUTURE) ×1 IMPLANT
SYR 50ML LL SCALE MARK (SYRINGE) IMPLANT
TRAY FOLEY MTR SLVR 16FR STAT (SET/KITS/TRAYS/PACK) ×3 IMPLANT
YANKAUER SUCT BULB TIP 10FT TU (MISCELLANEOUS) ×3 IMPLANT

## 2019-04-16 NOTE — Discharge Instructions (Signed)
°Dr. Frank Aluisio °Total Joint Specialist °[facility] °[address] °[place], [place] [address] °[phone] ° °ANTERIOR APPROACH TOTAL HIP REPLACEMENT POSTOPERATIVE DIRECTIONS ° ° °Hip Rehabilitation, Guidelines Following Surgery  °The results of a hip operation are greatly improved after range of motion and muscle strengthening exercises. Follow all safety measures which are given to protect your hip. If any of these exercises cause increased pain or swelling in your joint, decrease the amount until you are comfortable again. Then slowly increase the exercises. Call your caregiver if you have problems or questions.  ° °HOME CARE INSTRUCTIONS  °• Remove items at home which could result in a fall. This includes throw rugs or furniture in walking pathways.  °· ICE to the affected hip every three hours for 30 minutes at a time and then as needed for pain and swelling.  Continue to use ice on the hip for pain and swelling from surgery. You may notice swelling that will progress down to the foot and ankle.  This is normal after surgery.  Elevate the leg when you are not up walking on it.   °· Continue to use the breathing machine which will help keep your temperature down.  It is common for your temperature to cycle up and down following surgery, especially at night when you are not up moving around and exerting yourself.  The breathing machine keeps your lungs expanded and your temperature down. ° °DIET °You may resume your previous home diet once your are discharged from the hospital. ° °DRESSING / WOUND CARE / SHOWERING °You may shower 3 days after surgery, but keep the wounds dry during showering.  You may use an occlusive plastic wrap (Press'n Seal for example), NO SOAKING/SUBMERGING IN THE BATHTUB.  If the bandage gets wet, change with a clean dry gauze.  If the incision gets wet, pat the wound dry with a clean towel. °You may start showering once you are discharged home but do not submerge the  incision under water. Just pat the incision dry and apply a dry gauze dressing on daily. °Change the surgical dressing daily and reapply a dry dressing each time. ° °ACTIVITY °Walk with your walker as instructed. °Use walker as long as suggested by your caregivers. °Avoid periods of inactivity such as sitting longer than an hour when not asleep. This helps prevent blood clots.  °You may resume a sexual relationship in one month or when given the OK by your doctor.  °You may return to work once you are cleared by your doctor.  °Do not drive a car for 6 weeks or until released by you surgeon.  °Do not drive while taking narcotics. ° °WEIGHT BEARING °Weight bearing as tolerated with assist device (walker, cane, etc) as directed, use it as long as suggested by your surgeon or therapist, typically at least 4-6 weeks. ° °POSTOPERATIVE CONSTIPATION PROTOCOL °Constipation - defined medically as fewer than three stools per week and severe constipation as less than one stool per week. ° °One of the most common issues patients have following surgery is constipation.  Even if you have a regular bowel pattern at home, your normal regimen is likely to be disrupted due to multiple reasons following surgery.  Combination of anesthesia, postoperative narcotics, change in appetite and fluid intake all can affect your bowels.  In order to avoid complications following surgery, here are some recommendations in order to help you during your recovery period. ° °Colace (docusate) - Pick up an over-the-counter form   of Colace or another stool softener and take twice a day as long as you are requiring postoperative pain medications.  Take with a full glass of water daily.  If you experience loose stools or diarrhea, hold the colace until you stool forms back up.  If your symptoms do not get better within 1 week or if they get worse, check with your doctor. ° °Dulcolax (bisacodyl) - Pick up over-the-counter and take as directed by the product  packaging as needed to assist with the movement of your bowels.  Take with a full glass of water.  Use this product as needed if not relieved by Colace only.  ° °MiraLax (polyethylene glycol) - Pick up over-the-counter to have on hand.  MiraLax is a solution that will increase the amount of water in your bowels to assist with bowel movements.  Take as directed and can mix with a glass of water, juice, soda, coffee, or tea.  Take if you go more than two days without a movement. °Do not use MiraLax more than once per day. Call your doctor if you are still constipated or irregular after using this medication for 7 days in a row. ° °If you continue to have problems with postoperative constipation, please contact the office for further assistance and recommendations.  If you experience "the worst abdominal pain ever" or develop nausea or vomiting, please contact the office immediatly for further recommendations for treatment. ° °ITCHING ° If you experience itching with your medications, try taking only a single pain pill, or even half a pain pill at a time.  You can also use Benadryl over the counter for itching or also to help with sleep.  ° °TED HOSE STOCKINGS °Wear the elastic stockings on both legs for three weeks following surgery during the day but you may remove then at night for sleeping. ° °MEDICATIONS °See your medication summary on the “After Visit Summary” that the nursing staff will review with you prior to discharge.  You may have some home medications which will be placed on hold until you complete the course of blood thinner medication.  It is important for you to complete the blood thinner medication as prescribed by your surgeon.  Continue your approved medications as instructed at time of discharge. ° °PRECAUTIONS °If you experience chest pain or shortness of breath - call 911 immediately for transfer to the hospital emergency department.  °If you develop a fever greater that 101 F, purulent drainage  from wound, increased redness or drainage from wound, foul odor from the wound/dressing, or calf pain - CONTACT YOUR SURGEON.   °                                                °FOLLOW-UP APPOINTMENTS °Make sure you keep all of your appointments after your operation with your surgeon and caregivers. You should call the office at the above phone number and make an appointment for approximately two weeks after the date of your surgery or on the date instructed by your surgeon outlined in the "After Visit Summary". ° °RANGE OF MOTION AND STRENGTHENING EXERCISES  °These exercises are designed to help you keep full movement of your hip joint. Follow your caregiver's or physical therapist's instructions. Perform all exercises about fifteen times, three times per day or as directed. Exercise both hips, even if you have   had only one joint replacement. These exercises can be done on a training (exercise) mat, on the floor, on a table or on a bed. Use whatever works the best and is most comfortable for you. Use music or television while you are exercising so that the exercises are a pleasant break in your day. This will make your life better with the exercises acting as a break in routine you can look forward to.  °• Lying on your back, slowly slide your foot toward your buttocks, raising your knee up off the floor. Then slowly slide your foot back down until your leg is straight again.  °• Lying on your back spread your legs as far apart as you can without causing discomfort.  °• Lying on your side, raise your upper leg and foot straight up from the floor as far as is comfortable. Slowly lower the leg and repeat.  °• Lying on your back, tighten up the muscle in the front of your thigh (quadriceps muscles). You can do this by keeping your leg straight and trying to raise your heel off the floor. This helps strengthen the largest muscle supporting your knee.  °• Lying on your back, tighten up the muscles of your buttocks both  with the legs straight and with the knee bent at a comfortable angle while keeping your heel on the floor.  ° °IF YOU ARE TRANSFERRED TO A SKILLED REHAB FACILITY °If the patient is transferred to a skilled rehab facility following release from the hospital, a list of the current medications will be sent to the facility for the patient to continue.  When discharged from the skilled rehab facility, please have the facility set up the patient's Home Health Physical Therapy prior to being released. Also, the skilled facility will be responsible for providing the patient with their medications at time of release from the facility to include their pain medication, the muscle relaxants, and their blood thinner medication. If the patient is still at the rehab facility at time of the two week follow up appointment, the skilled rehab facility will also need to assist the patient in arranging follow up appointment in our office and any transportation needs. ° °MAKE SURE YOU:  °• Understand these instructions.  °• Get help right away if you are not doing well or get worse.  ° ° °Pick up stool softner and laxative for home use following surgery while on pain medications. °Do not submerge incision under water. °Please use good hand washing techniques while changing dressing each day. °May shower starting three days after surgery. °Please use a clean towel to pat the incision dry following showers. °Continue to use ice for pain and swelling after surgery. °Do not use any lotions or creams on the incision until instructed by your surgeon. ° °

## 2019-04-16 NOTE — Anesthesia Procedure Notes (Signed)
Procedure Name: MAC Date/Time: 04/16/2019 2:25 PM Performed by: Claudia Desanctis, CRNA Oxygen Delivery Method: Simple face mask

## 2019-04-16 NOTE — Interval H&P Note (Signed)
History and Physical Interval Note:  04/16/2019 1:13 PM  Jordan Mcconnell  has presented today for surgery, with the diagnosis of left hip osteoarthritis.  The various methods of treatment have been discussed with the patient and family. After consideration of risks, benefits and other options for treatment, the patient has consented to  Procedure(s) with comments: [place] (Left) - 146min as a surgical intervention.  The patient's history has been reviewed, patient examined, no change in status, stable for surgery.  I have reviewed the patient's chart and labs.  Questions were answered to the patient's satisfaction.     Pilar Plate Khyrie Masi

## 2019-04-16 NOTE — Op Note (Signed)
OPERATIVE REPORT- TOTAL HIP ARTHROPLASTY   PREOPERATIVE DIAGNOSIS: Osteoarthritis of the Left hip.   POSTOPERATIVE DIAGNOSIS: Osteoarthritis of the Left  hip.   PROCEDURE: Left total hip arthroplasty, anterior approach.   SURGEON: Gaynelle Arabian, MD   ASSISTANT: Ardeen Jourdain, PA-C  ANESTHESIA:  Spinal  ESTIMATED BLOOD LOSS:-250 mL    DRAINS: Hemovac x1.   COMPLICATIONS: None   CONDITION: PACU - hemodynamically stable.   BRIEF CLINICAL NOTE: Jordan Mcconnell is a 63 y.o. male who has advanced end-  stage arthritis of their Left  hip with progressively worsening pain and  dysfunction.The patient has failed nonoperative management and presents for  total hip arthroplasty.   PROCEDURE IN DETAIL: After successful administration of spinal  anesthetic, the traction boots for the [facility] bed were placed on both  feet and the patient was placed onto the [facility] bed, boots placed into the leg  holders. The Left hip was then isolated from the perineum with plastic  drapes and prepped and draped in the usual sterile fashion. ASIS and  greater trochanter were marked and a oblique incision was made, starting  at about 1 cm lateral and 2 cm distal to the ASIS and coursing towards  the anterior cortex of the femur. The skin was cut with a 10 blade  through subcutaneous tissue to the level of the fascia overlying the  tensor fascia lata muscle. The fascia was then incised in line with the  incision at the junction of the anterior third and posterior 2/3rd. The  muscle was teased off the fascia and then the interval between the TFL  and the rectus was developed. The Hohmann retractor was then placed at  the top of the femoral neck over the capsule. The vessels overlying the  capsule were cauterized and the fat on top of the capsule was removed.  A Hohmann retractor was then placed anterior underneath the rectus  femoris to give exposure to the entire anterior capsule. A T-shaped   capsulotomy was performed. The edges were tagged and the femoral head  was identified.       Osteophytes are removed off the superior acetabulum.  The femoral neck was then cut in situ with an oscillating saw. Traction  was then applied to the left lower extremity utilizing the [facility]  traction. The femoral head was then removed. Retractors were placed  around the acetabulum and then circumferential removal of the labrum was  performed. Osteophytes were also removed. Reaming starts at 49 mm to  medialize and  Increased in 2 mm increments to 51 mm. We reamed in  approximately 40 degrees of abduction, 20 degrees anteversion. A 52 mm  pinnacle acetabular shell was then impacted in anatomic position under  fluoroscopic guidance with excellent purchase. We did not need to place  any additional dome screws. A 32 mm neutral + 4 marathon liner was then  placed into the acetabular shell.       The femoral lift was then placed along the lateral aspect of the femur  just distal to the vastus ridge. The leg was  externally rotated and capsule  was stripped off the inferior aspect of the femoral neck down to the  level of the lesser trochanter, this was done with electrocautery. The femur was lifted after this was performed. The  leg was then placed in an extended and adducted position essentially delivering the femur. We also removed the capsule superiorly and the piriformis from the piriformis  fossa to gain excellent exposure of the  proximal femur. Rongeur was used to remove some cancellous bone to get  into the lateral portion of the proximal femur for placement of the  initial starter reamer. The starter broaches was placed  the starter broach  and was shown to go down the center of the canal. Broaching  with the Actis system was then performed starting at size 0  coursing  Up to size 7. A size 7 had excellent torsional and rotational  and axial stability. The trial high offset neck was then placed   with a 32 + 1 trial head. The hip was then reduced. We confirmed that  the stem was in the canal both on AP and lateral x-rays. It also has excellent sizing. The hip was reduced with outstanding stability through full extension and full external rotation.. AP pelvis was taken and the leg lengths were measured and found to be equal. Hip was then dislocated again and the femoral head and neck removed. The  femoral broach was removed. Size 7 Actis stem with a high offset  neck was then impacted into the femur following native anteversion. Has  excellent purchase in the canal. Excellent torsional and rotational and  axial stability. It is confirmed to be in the canal on AP and lateral  fluoroscopic views. The 32 + 1 ceramic head was placed and the hip  reduced with outstanding stability. Again AP pelvis was taken and it  confirmed that the leg lengths were equal. The wound was then copiously  irrigated with saline solution and the capsule reattached and repaired  with Ethibond suture. 30 ml of .25% Bupivicaine was  injected into the capsule and into the edge of the tensor fascia lata as well as subcutaneous tissue. The fascia overlying the tensor fascia lata was then closed with a running #1 V-Loc. Subcu was closed with interrupted 2-0 Vicryl and subcuticular running 4-0 Monocryl. Incision was cleaned  and dried. Steri-Strips and a bulky sterile dressing applied. Hemovac  drain was hooked to suction and then the patient was awakened and transported to  recovery in stable condition.        Please note that a surgical assistant was a medical necessity for this procedure to perform it in a safe and expeditious manner. Assistant was necessary to provide appropriate retraction of vital neurovascular structures and to prevent femoral fracture and allow for anatomic placement of the prosthesis.  Gaynelle Arabian, M.D.

## 2019-04-16 NOTE — Transfer of Care (Signed)
Immediate Anesthesia Transfer of Care Note  Patient: Jordan Mcconnell  Procedure(s) Performed: TOTAL HIP ARTHROPLASTY ANTERIOR APPROACH (Left Hip)  Patient Location: PACU  Anesthesia Type:Spinal  Level of Consciousness: drowsy  Airway & Oxygen Therapy: Patient Spontanous Breathing and Patient connected to face mask  Post-op Assessment: Report given to RN and Post -op Vital signs reviewed and stable  Post vital signs: Reviewed and stable  Last Vitals:  Vitals Value Taken Time  BP    Temp    Pulse 77 04/16/19 1602  Resp 10 04/16/19 1602  SpO2 97 % 04/16/19 1602  Vitals shown include unvalidated device data.  Last Pain:  Vitals:   04/16/19 1140  TempSrc:   PainSc: 0-No pain         Complications: No apparent anesthesia complications

## 2019-04-16 NOTE — Anesthesia Postprocedure Evaluation (Signed)
Anesthesia Post Note  Patient: NEVILLE WALSTON  Procedure(s) Performed: TOTAL HIP ARTHROPLASTY ANTERIOR APPROACH (Left Hip)     Patient location during evaluation: PACU Anesthesia Type: Spinal Level of consciousness: oriented and awake and alert Pain management: pain level controlled Vital Signs Assessment: post-procedure vital signs reviewed and stable Respiratory status: spontaneous breathing, respiratory function stable and patient connected to nasal cannula oxygen Cardiovascular status: blood pressure returned to baseline and stable Postop Assessment: no headache, no backache and no apparent nausea or vomiting Anesthetic complications: no    Last Vitals:  Vitals:   04/16/19 1615 04/16/19 1630  BP: 106/70 110/68  Pulse: 65 (!) 54  Resp: 19 13  Temp:    SpO2: 100% 98%    Last Pain:  Vitals:   04/16/19 1630  TempSrc:   PainSc: 0-No pain                 Bill Yohn L Saraann Enneking

## 2019-04-16 NOTE — Anesthesia Procedure Notes (Signed)
Spinal  Patient location during procedure: OR Start time: 04/16/2019 2:20 PM End time: 04/16/2019 2:24 PM Staffing Resident/CRNA: Claudia Desanctis, CRNA Preanesthetic Checklist Completed: patient identified, site marked, surgical consent, pre-op evaluation, timeout performed, IV checked, risks and benefits discussed and monitors and equipment checked Spinal Block Patient position: sitting Prep: DuraPrep Patient monitoring: heart rate, cardiac monitor, continuous pulse ox and blood pressure Approach: midline Location: L3-4 Injection technique: single-shot Needle Needle type: Sprotte and Pencan  Needle gauge: 24 G Needle length: 10 cm Needle insertion depth: 9.5 cm Assessment Sensory level: T4

## 2019-04-16 NOTE — Progress Notes (Signed)
PT Cancellation Note  Patient Details Name: Jordan Mcconnell MRN: [id number] DOB: January 14, 1956   Cancelled Treatment:    Reason Eval/Treat Not Completed: Patient not medically ready; pt distal LEs still with residual numbness d/t effects of spinal. PT deferred    [facility] 04/16/2019, 7:07 PM

## 2019-04-17 ENCOUNTER — Other Ambulatory Visit: Payer: Self-pay | Admitting: Cardiology

## 2019-04-17 ENCOUNTER — Encounter (HOSPITAL_COMMUNITY): Payer: Self-pay | Admitting: Orthopedic Surgery

## 2019-04-17 LAB — GLUCOSE, CAPILLARY
Glucose-Capillary: 142 mg/dL — ABNORMAL HIGH (ref 70–99)
Glucose-Capillary: 182 mg/dL — ABNORMAL HIGH (ref 70–99)

## 2019-04-17 LAB — BASIC METABOLIC PANEL
Anion gap: 10 (ref 5–15)
BUN: 18 mg/dL (ref 8–23)
CO2: 20 mmol/L — ABNORMAL LOW (ref 22–32)
Calcium: 8.4 mg/dL — ABNORMAL LOW (ref 8.9–10.3)
Chloride: 104 mmol/L (ref 98–111)
Creatinine, Ser: 0.76 mg/dL (ref 0.61–1.24)
GFR calc Af Amer: >60 mL/min (ref >60)
GFR calc non Af Amer: >60 mL/min (ref >60)
Glucose, Bld: 208 mg/dL — ABNORMAL HIGH (ref 70–99)
Potassium: 4.2 mmol/L (ref 3.5–5.1)
Sodium: 134 mmol/L — ABNORMAL LOW (ref 135–145)

## 2019-04-17 LAB — CBC
HCT: 41.4 % (ref 39.0–52.0)
Hemoglobin: 13.9 g/dL (ref 13.0–17.0)
MCH: 29 pg (ref 26.0–34.0)
MCHC: 33.6 g/dL (ref 30.0–36.0)
MCV: 86.3 fL (ref 80.0–100.0)
Platelets: 158 10*3/uL (ref 150–400)
RBC: 4.8 MIL/uL (ref 4.22–5.81)
RDW: 12.9 % (ref 11.5–15.5)
WBC: 14.8 10*3/uL — ABNORMAL HIGH (ref 4.0–10.5)
nRBC: 0 % (ref 0.0–0.2)

## 2019-04-17 MED ORDER — HYDROMORPHONE HCL 2 MG PO TABS: 2.0000 mg | ORAL_TABLET | Freq: Four times a day (QID) | ORAL | 0 refills | Status: DC | PRN

## 2019-04-17 MED ORDER — METHOCARBAMOL 500 MG PO TABS: 500.0000 mg | ORAL_TABLET | Freq: Four times a day (QID) | ORAL | 0 refills | Status: DC | PRN

## 2019-04-17 MED ORDER — ASPIRIN 325 MG PO TBEC: 325.0000 mg | DELAYED_RELEASE_TABLET | Freq: Two times a day (BID) | ORAL | 0 refills | Status: AC

## 2019-04-17 NOTE — Evaluation (Signed)
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  Pt will benefit from skilled PT to increase their independence and safety with mobility to allow discharge to the venue listed below.  Pt ambulated in hallway and performed LE exercises.  Will return this afternoon to ambulate again and practice safe stair technique.  Pt's spouse arrived end of session.     Follow Up Recommendations Follow surgeon's recommendation for DC plan and follow-up therapies    Equipment Recommendations  None recommended by PT    Recommendations for Other Services       Precautions / Restrictions Precautions Precautions: None Restrictions Weight Bearing Restrictions: No Other Position/Activity Restrictions: WBAT      Mobility  Bed Mobility Overal bed mobility: Needs Assistance Bed Mobility: Supine to Sit     Supine to sit: Supervision        Transfers Overall transfer level: Needs assistance Equipment used: Rolling walker (2 wheeled) Transfers: Sit to/from Stand Sit to Stand: Min guard         General transfer comment: verbal cues for UE and LE positioning  Ambulation/Gait Ambulation/Gait assistance: Min guard Gait Distance (Feet): 240 Feet Assistive device: Rolling walker (2 wheeled) Gait Pattern/deviations: Step-to pattern;Decreased stance time - left;Antalgic     General Gait Details: verbal cues for sequence, RW positioning, posture  Stairs            Wheelchair Mobility    Modified Rankin (Stroke Patients Only)       Balance                                             Pertinent Vitals/Pain Pain Assessment: 0-10 Pain Score: 4  Pain Location: left hip Pain Descriptors / Indicators:  Sore;Aching Pain Intervention(s): Limited activity within patient's tolerance;Monitored during session;Repositioned;Ice applied    Home Living Family/patient expects to be discharged to:: Private residence Living Arrangements: Spouse/significant other Available Help at Discharge: Family;Available 24 hours/day Type of Home: House Home Access: Stairs to enter Entrance Stairs-Rails: Right Entrance Stairs-Number of Steps: 3-4 Home Layout: One level Home Equipment: Walker - 2 wheels;Cane - single point      Prior Function Level of Independence: Independent               Hand Dominance        Extremity/Trunk Assessment        Lower Extremity Assessment Lower Extremity Assessment: LLE deficits/detail LLE Deficits / Details: anticipated post op hip weakness, able to perform good quad contraction, able to perform ankle pumps       Communication   Communication: No difficulties  Cognition Arousal/Alertness: Awake/alert Behavior During Therapy: WFL for tasks assessed/performed Overall Cognitive Status: Within Functional Limits for tasks assessed                                        General Comments      Exercises Total Joint Exercises Ankle Circles/Pumps: AROM;Both;10 reps Quad Sets: AROM;Both;10 reps Short Arc Quad: AROM;Left;10 reps Heel Slides:  AAROM;Left;10 reps Hip ABduction/ADduction: AAROM;Left;10 reps Straight Leg Raises: AAROM;Left;10 reps Knee Flexion: AAROM;Left;10 reps   Assessment/Plan    PT Assessment Patient needs continued PT services  PT Problem List Decreased strength;Decreased mobility;Decreased balance;Decreased knowledge of use of DME;Pain       PT Treatment Interventions Stair training;Functional mobility training;Gait training;Therapeutic exercise;Patient/family education;DME instruction;Therapeutic activities    PT Goals (Current goals can be found in the Care Plan section)  Acute Rehab PT Goals PT Goal Formulation:  With patient/family Time For Goal Achievement: 04/21/19 Potential to Achieve Goals: Good    Frequency 7X/week   Barriers to discharge        Co-evaluation               AM-PAC PT "6 Clicks" Mobility  Outcome Measure Help needed turning from your back to your side while in a flat bed without using bedrails?: A Little Help needed moving from lying on your back to sitting on the side of a flat bed without using bedrails?: A Little Help needed moving to and from a bed to a chair (including a wheelchair)?: A Little Help needed standing up from a chair using your arms (e.g., wheelchair or bedside chair)?: A Little Help needed to walk in hospital room?: A Little Help needed climbing 3-5 steps with a railing? : A Little 6 Click Score: 18    End of Session   Activity Tolerance: Patient tolerated treatment well Patient left: in chair;with call bell/phone within reach;with family/visitor present Nurse Communication: Mobility status PT Visit Diagnosis: Other abnormalities of gait and mobility (R26.89)    Time: 1011-1030 PT Time Calculation (min) (ACUTE ONLY): 19 min   Charges:   PT Evaluation $PT Eval Low Complexity: Darby, PT, DPT Acute Rehabilitation Services Office: [phone] Pager: [phone]  Trena Platt 04/17/2019, 12:22 PM

## 2019-04-17 NOTE — Progress Notes (Signed)
   Subjective: 1 Day Post-Op Procedure(s) (LRB): TOTAL HIP ARTHROPLASTY ANTERIOR APPROACH (Left) Patient reports pain as mild.   Patient seen in rounds by Dr. Wynelle Link. Patient is well, and has had no acute complaints or problems other than discomfort in the left hip. No acute events overnight. Foley catheter removed, positive flatus. Denies CP, SHOB. We will start therapy today.   Objective: Vital signs in last 24 hours: Temp:  [97.6 F (36.4 C)-98.8 F (37.1 C)] 97.7 F (36.5 C) (10/13 0531) Pulse Rate:  [51-78] 63 (10/13 0531) Resp:  [10-19] 18 (10/13 0531) BP: (96-150)/(63-89) 143/80 (10/13 0531) SpO2:  [95 %-100 %] 97 % (10/13 0531) Weight:  [120 kg] 120 kg (10/12 1140)  Intake/Output from previous day:  Intake/Output Summary (Last 24 hours) at 04/17/2019 0737 Last data filed at 04/17/2019 0553 Gross per 24 hour  Intake 3539 ml  Output 2740 ml  Net 799 ml     Intake/Output this shift: No intake/output data recorded.  Labs: Recent Labs    04/17/19 0345  HGB 13.9   Recent Labs    04/17/19 0345  WBC 14.8*  RBC 4.80  HCT 41.4  PLT 158   Recent Labs    04/17/19 0345  NA 134*  K 4.2  CL 104  CO2 20*  BUN 18  CREATININE 0.76  GLUCOSE 208*  CALCIUM 8.4*   No results for input(s): LABPT, INR in the last 72 hours.  Exam: General - Patient is Alert and Oriented Extremity - Neurologically intact Sensation intact distally Intact pulses distally Dorsiflexion/Plantar flexion intact Dressing - dressing C/D/I Motor Function - intact, moving foot and toes well on exam.   Past Medical History:  Diagnosis Date  . Arthritis   . Complication of anesthesia    one surgery pt. states he was hard to wake up  . Diabetes mellitus without complication ([place])    type 2  . GERD (gastroesophageal reflux disease)   . HTN (hypertension)   . 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 Obesity   . OSA (obstructive sleep apnea)    uses CPAP  . PONV  (postoperative nausea and vomiting)     Assessment/Plan: 1 Day Post-Op Procedure(s) (LRB): TOTAL HIP ARTHROPLASTY ANTERIOR APPROACH (Left) Principal Problem:   OA (osteoarthritis) of hip  Estimated body mass index is 36.9 kg/m as calculated from the following:   Height as of this encounter: 5\' 11"  (1.803 m).   Weight as of this encounter: 120 kg. Advance diet Up with therapy D/C IV fluids  DVT Prophylaxis - Aspirin Weight bearing as tolerated. D/C O2 and pulse ox and try on room air. Hemovac pulled without difficulty, will begin therapy.  Plan is to go Home after hospital stay with HEP. Plan for discharge today following 1-2 sessions of therapy as long as he is meeting goals with therapy. He was unable to participate in therapy yesterday due to unresolved spinal anesthesia. Follow up in the office in 2 weeks.  Griffith Citron, PA-C Orthopedic Surgery [phone] 04/17/2019, 7:37 AM

## 2019-04-17 NOTE — Progress Notes (Signed)
Physical Therapy Treatment Patient Details Name: Jordan Mcconnell MRN: [id number] DOB: 14-Aug-1955 Today's Date: 04/17/2019    History of Present Illness Pt is a 63 year old male s/p L DA THA with hx of bil TKAs    PT Comments    Pt ambulated in hallway and performed safe stair technique.  Pt also performed standing exercises and provided with HEP handout. Pt and spouse had no further questions, and pt feels ready for d/c home today.    Follow Up Recommendations  Follow surgeon's recommendation for DC plan and follow-up therapies     Equipment Recommendations  None recommended by PT    Recommendations for Other Services       Precautions / Restrictions Precautions Precautions: None Restrictions Other Position/Activity Restrictions: WBAT    Mobility  Bed Mobility Overal bed mobility: Needs Assistance Bed Mobility: Supine to Sit     Supine to sit: Supervision     General bed mobility comments: pt in recliner on arrival  Transfers Overall transfer level: Needs assistance Equipment used: Rolling walker (2 wheeled) Transfers: Sit to/from Stand Sit to Stand: Min guard;Supervision         General transfer comment: verbal cues for UE and LE positioning  Ambulation/Gait Ambulation/Gait assistance: Min guard;Supervision Gait Distance (Feet): 280 Feet Assistive device: Rolling walker (2 wheeled) Gait Pattern/deviations: Step-to pattern;Decreased stance time - left;Antalgic     General Gait Details: verbal cues for sequence, RW positioning, posture   Stairs Stairs: Yes Stairs assistance: Min guard Stair Management: Step to pattern;Forwards;One rail Left Number of Stairs: 3 General stair comments: verbal cues for safety and sequence; performed once with 2 rails and then again with only one rail; spouse present and observed   Wheelchair Mobility    Modified Rankin (Stroke Patients Only)       Balance                                            Cognition Arousal/Alertness: Awake/alert Behavior During Therapy: WFL for tasks assessed/performed Overall Cognitive Status: Within Functional Limits for tasks assessed                                        Exercises Total Joint Exercises Ankle Circles/Pumps: AROM;Both;10 reps Quad Sets: AROM;Both;10 reps Short Arc Quad: AROM;Left;10 reps Heel Slides: AAROM;Left;10 reps Hip ABduction/ADduction: AROM;Standing;Left;10 reps Straight Leg Raises: AAROM;Left;10 reps Knee Flexion: AROM;Standing;Left;10 reps Marching in Standing: AROM;Left;10 reps;Standing Standing Hip Extension: AROM;Left;Standing;10 reps    General Comments        Pertinent Vitals/Pain Pain Assessment: 0-10 Pain Score: 3  Pain Location: left hip Pain Descriptors / Indicators: Sore;Aching Pain Intervention(s): Monitored during session;Repositioned    Home Living Family/patient expects to be discharged to:: Private residence Living Arrangements: Spouse/significant other Available Help at Discharge: Family;Available 24 hours/day Type of Home: House Home Access: Stairs to enter Entrance Stairs-Rails: Right Home Layout: One level Home Equipment: Walker - 2 wheels;Cane - single point      Prior Function Level of Independence: Independent          PT Goals (current goals can now be found in the care plan section) Acute Rehab PT Goals PT Goal Formulation: With patient/family Time For Goal Achievement: 04/21/19 Potential to Achieve Goals: Good Progress towards PT goals: Progressing  toward goals    Frequency    7X/week      PT Plan Current plan remains appropriate    Co-evaluation              AM-PAC PT "6 Clicks" Mobility   Outcome Measure  Help needed turning from your back to your side while in a flat bed without using bedrails?: A Little Help needed moving from lying on your back to sitting on the side of a flat bed without using bedrails?: A Little Help needed  moving to and from a bed to a chair (including a wheelchair)?: A Little Help needed standing up from a chair using your arms (e.g., wheelchair or bedside chair)?: A Little Help needed to walk in hospital room?: A Little Help needed climbing 3-5 steps with a railing? : A Little 6 Click Score: 18    End of Session   Activity Tolerance: Patient tolerated treatment well Patient left: in chair;with family/visitor present;with call bell/phone within reach Nurse Communication: Mobility status PT Visit Diagnosis: Other abnormalities of gait and mobility (R26.89)     Time: 1062-6948 PT Time Calculation (min) (ACUTE ONLY): 15 min  Charges:  $Gait Training: 8-22 mins                    Zenovia Jarred, PT, DPT Acute Rehabilitation Services Office: [phone] Pager: [phone]  Sarajane Jews 04/17/2019, 3:44 PM

## 2019-04-18 NOTE — Discharge Summary (Addendum)
Physician Discharge Summary   Patient ID: Jordan Mcconnell MRN: [id number] DOB/AGE: 1956-05-13 63 y.o.  Admit date: 04/16/2019 Discharge date: 04/17/2019  Primary Diagnosis: Osteoarthritis of the left hip  Admission Diagnoses:  Past Medical History:  Diagnosis Date  . Arthritis   . Complication of anesthesia    one surgery pt. states he was hard to wake up  . Diabetes mellitus without complication (HCC)    type 2  . GERD (gastroesophageal reflux disease)   . HTN (hypertension)   . 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 Obesity   . OSA (obstructive sleep apnea)    uses CPAP  . PONV (postoperative nausea and vomiting)    Discharge Diagnoses:   Principal Problem:   OA (osteoarthritis) of hip  Estimated body mass index is 36.9 kg/m as calculated from the following:   Height as of this encounter:  (1.803 m).   Weight as of this encounter: 120 kg.  Procedure:  Procedure(s) (LRB): TOTAL HIP ARTHROPLASTY ANTERIOR APPROACH (Left)   Consults: None  HPI: CONTRELL BALLENTINE is a 63 y.o. male who has advanced end-  stage arthritis of their Left  hip with progressively worsening pain and  dysfunction.The patient has failed nonoperative management and presents for  total hip arthroplasty.   Laboratory Data: Admission on 04/16/2019, Discharged on 04/17/2019  Component Date Value Ref Range Status  . Glucose-Capillary 04/16/2019 116* 70 - 99 mg/dL Final  . Glucose-Capillary 04/16/2019 100* 70 - 99 mg/dL Final  . WBC 40/98/1191 14.8* 4.0 - 10.5 K/uL Final  . RBC 04/17/2019 4.80  4.22 - 5.81 MIL/uL Final  . Hemoglobin 04/17/2019 13.9  13.0 - 17.0 g/dL Final  . HCT 47/82/9562 41.4  39.0 - 52.0 % Final  . MCV 04/17/2019 86.3  80.0 - 100.0 fL Final  . MCH 04/17/2019 29.0  26.0 - 34.0 pg Final  . MCHC 04/17/2019 33.6  30.0 - 36.0 g/dL Final  . RDW 13/02/6577 12.9  11.5 - 15.5 % Final  . Platelets 04/17/2019 158  150 - 400 K/uL Final  . nRBC 04/17/2019 0.0  0.0 -  0.2 % Final   Performed at [facility], [address], [place], [place] [address]  . Sodium 04/17/2019 134* 135 - 145 mmol/L Final  . Potassium 04/17/2019 4.2  3.5 - 5.1 mmol/L Final  . Chloride 04/17/2019 104  98 - 111 mmol/L Final  . CO2 04/17/2019 20* 22 - 32 mmol/L Final  . Glucose, Bld 04/17/2019 208* 70 - 99 mg/dL Final  . BUN 95/28/4132 18  8 - 23 mg/dL Final  . Creatinine, Ser 04/17/2019 0.76  0.61 - 1.24 mg/dL Final  . Calcium 44/07/270 8.4* 8.9 - 10.3 mg/dL Final  . GFR calc non Af Amer 04/17/2019 >60  >60 mL/min Final  . GFR calc Af Amer 04/17/2019 >60  >60 mL/min Final  . Anion gap 04/17/2019 10  5 - 15 Final   Performed at [facility], [address], [place], [place] [address]  . Glucose-Capillary 04/16/2019 175* 70 - 99 mg/dL Final  . Glucose-Capillary 04/17/2019 142* 70 - 99 mg/dL Final  . Glucose-Capillary 04/17/2019 182* 70 - 99 mg/dL Final  Hospital Outpatient Visit on 04/12/2019  Component Date Value Ref Range Status  . SARS-CoV-2, NAA 04/12/2019 NOT DETECTED  NOT DETECTED Final   Comment: (NOTE) This nucleic acid amplification test was developed and its performance characteristics determined by [employer]. Nucleic acid amplification tests include PCR and  TMA. This test has not been FDA cleared or approved. This test has been authorized by FDA under an Emergency Use Authorization (EUA). This test is only authorized for the duration of time the declaration that circumstances exist justifying the authorization of the emergency use of in vitro diagnostic tests for detection of SARS-CoV-2 virus and/or diagnosis of COVID-19 infection under section 564(b)(1) of the Act, 21 U.S.C. 960AVW-0(J) (1), unless the authorization is terminated or revoked sooner. When diagnostic testing is negative, the possibility of a false negative result should be considered in the context of a patient's recent exposures and the presence of  clinical signs and symptoms consistent with COVID-19. An individual without symptoms of COVID- 19 and who is not shedding SARS-CoV-2 vi                          rus would expect to have a negative (not detected) result in this assay. Performed At: [facility] [address] [place], [place] [id number] Jolene Schimke MD NF:[id number]   . Coronavirus Source 04/12/2019 NASOPHARYNGEAL   Final   Performed at [facility], [address], [place], [place] [address]  Hospital Outpatient Visit on 04/11/2019  Component Date Value Ref Range Status  . Hgb A1c MFr Bld 04/11/2019 6.7* 4.8 - 5.6 % Final   Comment: (NOTE) Pre diabetes:          5.7%-6.4% Diabetes:              >6.4% Glycemic control for   <7.0% adults with diabetes   . Mean Plasma Glucose 04/11/2019 145.59  mg/dL Final   Performed at [facility], [address], [place], [place] [address]  . aPTT 04/11/2019 29  24 - 36 seconds Final   Performed at [facility], [address], [place], [place] [address]  . WBC 04/11/2019 9.2  4.0 - 10.5 K/uL Final  . RBC 04/11/2019 5.80  4.22 - 5.81 MIL/uL Final  . Hemoglobin 04/11/2019 16.9  13.0 - 17.0 g/dL Final  . HCT 07/07/7251 51.2  39.0 - 52.0 % Final  . MCV 04/11/2019 88.3  80.0 - 100.0 fL Final  . MCH 04/11/2019 29.1  26.0 - 34.0 pg Final  . MCHC 04/11/2019 33.0  30.0 - 36.0 g/dL Final  . RDW 66/44/0347 12.9  11.5 - 15.5 % Final  . Platelets 04/11/2019 226  150 - 400 K/uL Final  . nRBC 04/11/2019 0.0  0.0 - 0.2 % Final  . Neutrophils Relative % 04/11/2019 50  % Final  . Neutro Abs 04/11/2019 4.7  1.7 - 7.7 K/uL Final  . Lymphocytes Relative 04/11/2019 36  % Final  . Lymphs Abs 04/11/2019 3.4  0.7 - 4.0 K/uL Final  . Monocytes Relative 04/11/2019 7  % Final  . Monocytes Absolute 04/11/2019 0.6  0.1 - 1.0 K/uL Final  . Eosinophils Relative 04/11/2019 5  % Final  . Eosinophils Absolute 04/11/2019 0.4  0.0 - 0.5 K/uL Final  . Basophils Relative  04/11/2019 1  % Final  . Basophils Absolute 04/11/2019 0.1  0.0 - 0.1 K/uL Final  . Immature Granulocytes 04/11/2019 1  % Final  . Abs Immature Granulocytes 04/11/2019 0.05  0.00 - 0.07 K/uL Final   Performed at [facility], [address], [place], [place] [address]  . Sodium 04/11/2019 136  135 - 145 mmol/L Final  . Potassium 04/11/2019 4.2  3.5 - 5.1 mmol/L Final  .  Chloride 04/11/2019 100  98 - 111 mmol/L Final  . CO2 04/11/2019 24  22 - 32 mmol/L Final  . Glucose, Bld 04/11/2019 113* 70 - 99 mg/dL Final  . BUN 04/11/2019 19  8 - 23 mg/dL Final  . Creatinine, Ser 04/11/2019 0.82  0.61 - 1.24 mg/dL Final  . Calcium 04/11/2019 9.6  8.9 - 10.3 mg/dL Final  . Total Protein 04/11/2019 8.1  6.5 - 8.1 g/dL Final  . Albumin 04/11/2019 4.6  3.5 - 5.0 g/dL Final  . AST 04/11/2019 27  15 - 41 U/L Final  . ALT 04/11/2019 48* 0 - 44 U/L Final  . Alkaline Phosphatase 04/11/2019 71  38 - 126 U/L Final  . Total Bilirubin 04/11/2019 0.7  0.3 - 1.2 mg/dL Final  . GFR calc non Af Amer 04/11/2019 >60  >60 mL/min Final  . GFR calc Af Amer 04/11/2019 >60  >60 mL/min Final  . Anion gap 04/11/2019 12  5 - 15 Final   Performed at [facility], [place] [address] [place], [place] [address]  . Prothrombin Time 04/11/2019 11.9  11.4 - 15.2 seconds Final  . INR 04/11/2019 0.9  0.8 - 1.2 Final   Comment: (NOTE) INR goal varies based on device and disease states. Performed at [facility], [place] [address], [place], [place] [address]   . ABO/RH(D) 04/11/2019 O POS   Final  . Antibody Screen 04/11/2019 NEG   Final  . Sample Expiration 04/11/2019 04/19/2019,2359   Final  . Extend sample reason 04/11/2019    Final                   Value:NO TRANSFUSIONS OR PREGNANCY IN THE PAST 3 MONTHS Performed at [place] [address], [place], [place] [address]   . Glucose-Capillary 04/11/2019 181* 70 - 99 mg/dL Final  . MRSA, PCR 04/11/2019  NEGATIVE  NEGATIVE Final  . Staphylococcus aureus 04/11/2019 NEGATIVE  NEGATIVE Final   Comment: (NOTE) The Xpert SA Assay (FDA approved for NASAL specimens in patients 61 years of age and older), is one component of a comprehensive surveillance program. It is not intended to diagnose infection nor to guide or monitor treatment. Performed at [facility], [place] [address] [place], [place] [address]   . ABO/RH(D) 04/11/2019    Final                   Value:O POS Performed at [facility], [place] [address], [place],  [address]   Office Visit on 03/26/2019  Component Date Value Ref Range Status  . Cholesterol 03/26/2019 143  0 - 200 mg/dL Final   ATP III Classification       Desirable:  < 200 mg/dL               Borderline High:  200 - 239 mg/dL          High:  > = 240 mg/dL  . Triglycerides 03/26/2019 203.0* 0.0 - 149.0 mg/dL Final   Normal:  <150 mg/dLBorderline High:  150 - 199 mg/dL  . HDL 03/26/2019 33.70* >39.00 mg/dL Final  . VLDL 03/26/2019 40.6* 0.0 - 40.0 mg/dL Final  . Total CHOL/HDL Ratio 03/26/2019 4   Final                  Men          Women1/2 Average Risk     3.4  3.3Average Risk          5.0          4.42X Average Risk          9.6          7.13X Average Risk          15.0          11.0                      . NonHDL 03/26/2019 109.51   Final   NOTE:  Non-HDL goal should be 30 mg/dL higher than patient's LDL goal (i.e. LDL goal of < 70 mg/dL, would have non-HDL goal of < 100 mg/dL)  . Sodium 03/26/2019 138  135 - 145 mEq/L Final  . Potassium 03/26/2019 4.3  3.5 - 5.1 mEq/L Final  . Chloride 03/26/2019 101  96 - 112 mEq/L Final  . CO2 03/26/2019 25  19 - 32 mEq/L Final  . Glucose, Bld 03/26/2019 76  70 - 99 mg/dL Final  . BUN 51/08/5850 14  6 - 23 mg/dL Final  . Creatinine, Ser 03/26/2019 0.79  0.40 - 1.50 mg/dL Final  . Calcium 77/82/4235 9.7  8.4 - 10.5 mg/dL Final  . GFR 36/14/4315 99.18  >60.00 mL/min Final  . TSH  03/26/2019 2.31  0.35 - 4.50 uIU/mL Final  . Total Bilirubin 03/26/2019 0.4  0.2 - 1.2 mg/dL Final  . Bilirubin, Direct 03/26/2019 0.1  0.0 - 0.3 mg/dL Final  . Alkaline Phosphatase 03/26/2019 74  39 - 117 U/L Final  . AST 03/26/2019 26  0 - 37 U/L Final  . ALT 03/26/2019 39  0 - 53 U/L Final  . Total Protein 03/26/2019 7.1  6.0 - 8.3 g/dL Final  . Albumin 40/02/6760 4.4  3.5 - 5.2 g/dL Final  . WBC 95/03/3266 9.0  4.0 - 10.5 K/uL Final  . RBC 03/26/2019 5.28  4.22 - 5.81 Mil/uL Final  . Hemoglobin 03/26/2019 15.5  13.0 - 17.0 g/dL Final  . HCT 12/45/8099 46.2  39.0 - 52.0 % Final  . MCV 03/26/2019 87.6  78.0 - 100.0 fl Final  . MCHC 03/26/2019 33.6  30.0 - 36.0 g/dL Final  . RDW 83/38/2505 13.5  11.5 - 15.5 % Final  . Platelets 03/26/2019 199.0 Repeated and verified X2.  150.0 - 400.0 K/uL Final   Manual smear review agrees with instrument differential.  . Neutrophils Relative % 03/26/2019 50.3  43.0 - 77.0 % Final  . Lymphocytes Relative 03/26/2019 36.3  12.0 - 46.0 % Final  . Monocytes Relative 03/26/2019 7.9  3.0 - 12.0 % Final  . Eosinophils Relative 03/26/2019 4.4  0.0 - 5.0 % Final  . Basophils Relative 03/26/2019 1.1  0.0 - 3.0 % Final  . Neutro Abs 03/26/2019 4.5  1.4 - 7.7 K/uL Final  . Lymphs Abs 03/26/2019 3.3  0.7 - 4.0 K/uL Final  . Monocytes Absolute 03/26/2019 0.7  0.1 - 1.0 K/uL Final  . Eosinophils Absolute 03/26/2019 0.4  0.0 - 0.7 K/uL Final  . Basophils Absolute 03/26/2019 0.1  0.0 - 0.1 K/uL Final  . PSA 03/26/2019 1.34  0.10 - 4.00 ng/mL Final   Test performed using [employer] PSA Assay, a parmagnetic partical, chemiluminecent immunoassay.  . Direct LDL 03/26/2019 89.0  mg/dL Final   Optimal:  <397 mg/dLNear or Above Optimal:  100-129 mg/dLBorderline High:  130-159 mg/dLHigh:  160-189 mg/dLVery High:  >190 mg/dL     X-Rays:Dg  Pelvis Portable  Result Date: 04/16/2019 CLINICAL DATA:  Left hip replacement EXAM: PORTABLE PELVIS 1-2 VIEWS COMPARISON:  None.  FINDINGS: The patient is status post left hip replacement. Hardware is in good position. A surgical drain is noted. IMPRESSION: Left hip replacement as above. Electronically Signed   By: Gerome Sam III M.D   On: 04/16/2019 16:42   Dg C-arm 1-60 Min-no Report  Result Date: 04/16/2019 Fluoroscopy was utilized by the requesting physician.  No radiographic interpretation.   Dg Hip Operative Unilat W Or W/o Pelvis Left  Result Date: 04/16/2019 CLINICAL DATA:  Left hip replacement FLUOROSCOPY TIME:  9 seconds EXAM: OPERATIVE LEFT HIP (WITH PELVIS IF PERFORMED) 5 VIEWS TECHNIQUE: Fluoroscopic spot image(s) were submitted for interpretation post-operatively. COMPARISON:  None. FINDINGS: The patient is status post left hip replacement by the end of the study. Hardware is in good position. IMPRESSION: Left hip replacement as above. Electronically Signed   By: Gerome Sam III M.D   On: 04/16/2019 16:42    EKG: Orders placed or performed in visit on 03/26/19  . EKG 12-Lead     Hospital Course: AMAREON PHUNG is a 63 y.o. who was admitted to [facility]. They were brought to the operating room on 04/16/2019 and underwent Procedure(s): TOTAL HIP ARTHROPLASTY ANTERIOR APPROACH.  Patient tolerated the procedure well and was later transferred to the recovery room and then to the orthopaedic floor for postoperative care. They were given PO and IV analgesics for pain control following their surgery. They were given 24 hours of postoperative antibiotics of  Anti-infectives (From admission, onward)   Start     Dose/Rate Route Frequency Ordered Stop   04/17/19 0200  vancomycin (VANCOCIN) IVPB 1000 mg/200 mL premix     1,000 mg 200 mL/hr over 60 Minutes Intravenous Every 12 hours 04/16/19 1805 04/17/19 0341   04/16/19 0600  vancomycin (VANCOCIN) 1,500 mg in sodium chloride 0.9 % 500 mL IVPB     1,500 mg 250 mL/hr over 120 Minutes Intravenous On call to O.R. 04/15/19 1254 04/16/19 1514      and started on DVT prophylaxis in the form of Aspirin.   PT and OT were ordered for total joint protocol. Discharge planning consulted to help with postop disposition and equipment needs.  Patient had a good night on the evening of surgery. They started to get up OOB with therapy on POD #1. Pt was seen during rounds and was ready to go home pending progress with therapy. Hemovac drain was pulled without difficulty. He worked with therapy on POD #1 and was meeting his goals. Pt was discharged to home later that day in stable condition.  Diet: Regular diet Activity: WBAT Follow-up: in 2 weeks Disposition: Home Discharged Condition: good   Discharge Instructions    Call MD / Call 911   Complete by: As directed    If you experience chest pain or shortness of breath, CALL 911 and be transported to the hospital emergency room.  If you develope a fever above 101 F, pus (white drainage) or increased drainage or redness at the wound, or calf pain, call your surgeon's office.   Change dressing   Complete by: As directed    You may change your dressing on Wednesday, then change the dressing daily with sterile 4 x 4 inch gauze dressing and paper tape.   Constipation Prevention   Complete by: As directed    Drink plenty of fluids.  Prune juice may be helpful.  You may use a stool softener, such as Colace (over the counter) 100 mg twice a day.  Use MiraLax (over the counter) for constipation as needed.   Diet - low sodium heart healthy   Complete by: As directed    Discharge instructions   Complete by: As directed    Dr. Ollen GrossFrank Aluisio Total Joint Specialist [facility] [address] [place], [place] [id number] [phone]  ANTERIOR APPROACH TOTAL HIP REPLACEMENT POSTOPERATIVE DIRECTIONS   Hip Rehabilitation, Guidelines Following Surgery  The results of a hip operation are greatly improved after range of motion and muscle strengthening exercises. Follow all safety measures which are  given to protect your hip. If any of these exercises cause increased pain or swelling in your joint, decrease the amount until you are comfortable again. Then slowly increase the exercises. Call your caregiver if you have problems or questions.   HOME CARE INSTRUCTIONS  Remove items at home which could result in a fall. This includes throw rugs or furniture in walking pathways.  ICE to the affected hip every three hours for 30 minutes at a time and then as needed for pain and swelling.  Continue to use ice on the hip for pain and swelling from surgery. You may notice swelling that will progress down to the foot and ankle.  This is normal after surgery.  Elevate the leg when you are not up walking on it.   Continue to use the breathing machine which will help keep your temperature down.  It is common for your temperature to cycle up and down following surgery, especially at night when you are not up moving around and exerting yourself.  The breathing machine keeps your lungs expanded and your temperature down.  DIET You may resume your previous home diet once your are discharged from the hospital.  DRESSING / WOUND CARE / SHOWERING You may change your dressing 3-5 days after surgery.  Then change the dressing every day with sterile gauze.  Please use good hand washing techniques before changing the dressing.  Do not use any lotions or creams on the incision until instructed by your surgeon. You may start showering once you are discharged home but do not submerge the incision under water. Just pat the incision dry and apply a dry gauze dressing on daily. Change the surgical dressing daily and reapply a dry dressing each time.  ACTIVITY Walk with your walker as instructed. Use walker as long as suggested by your caregivers. Avoid periods of inactivity such as sitting longer than an hour when not asleep. This helps prevent blood clots.  You may resume a sexual relationship in one month or when given  the OK by your doctor.  You may return to work once you are cleared by your doctor.  Do not drive a car for 6 weeks or until released by you surgeon.  Do not drive while taking narcotics.  WEIGHT BEARING Weight bearing as tolerated with assist device (walker, cane, etc) as directed, use it as long as suggested by your surgeon or therapist, typically at least 4-6 weeks.  POSTOPERATIVE CONSTIPATION PROTOCOL Constipation - defined medically as fewer than three stools per week and severe constipation as less than one stool per week.  One of the most common issues patients have following surgery is constipation.  Even if you have a regular bowel pattern at home, your normal regimen is likely to be disrupted due to multiple reasons following surgery.  Combination of anesthesia, postoperative  narcotics, change in appetite and fluid intake all can affect your bowels.  In order to avoid complications following surgery, here are some recommendations in order to help you during your recovery period.  Colace (docusate) - Pick up an over-the-counter form of Colace or another stool softener and take twice a day as long as you are requiring postoperative pain medications.  Take with a full glass of water daily.  If you experience loose stools or diarrhea, hold the colace until you stool forms back up.  If your symptoms do not get better within 1 week or if they get worse, check with your doctor.  Dulcolax (bisacodyl) - Pick up over-the-counter and take as directed by the product packaging as needed to assist with the movement of your bowels.  Take with a full glass of water.  Use this product as needed if not relieved by Colace only.   MiraLax (polyethylene glycol) - Pick up over-the-counter to have on hand.  MiraLax is a solution that will increase the amount of water in your bowels to assist with bowel movements.  Take as directed and can mix with a glass of water, juice, soda, coffee, or tea.  Take if you go  more than two days without a movement. Do not use MiraLax more than once per day. Call your doctor if you are still constipated or irregular after using this medication for 7 days in a row.  If you continue to have problems with postoperative constipation, please contact the office for further assistance and recommendations.  If you experience "the worst abdominal pain ever" or develop nausea or vomiting, please contact the office immediatly for further recommendations for treatment.  ITCHING  If you experience itching with your medications, try taking only a single pain pill, or even half a pain pill at a time.  You can also use Benadryl over the counter for itching or also to help with sleep.   TED HOSE STOCKINGS Wear the elastic stockings on both legs for three weeks following surgery during the day but you may remove then at night for sleeping.  MEDICATIONS See your medication summary on the "After Visit Summary" that the nursing staff will review with you prior to discharge.  You may have some home medications which will be placed on hold until you complete the course of blood thinner medication.  It is important for you to complete the blood thinner medication as prescribed by your surgeon.  Continue your approved medications as instructed at time of discharge.  PRECAUTIONS If you experience chest pain or shortness of breath - call 911 immediately for transfer to the hospital emergency department.  If you develop a fever greater that 101 F, purulent drainage from wound, increased redness or drainage from wound, foul odor from the wound/dressing, or calf pain - CONTACT YOUR SURGEON.                                                   FOLLOW-UP APPOINTMENTS Make sure you keep all of your appointments after your operation with your surgeon and caregivers. You should call the office at the above phone number and make an appointment for approximately two weeks after the date of your surgery or on  the date instructed by your surgeon outlined in the "After Visit Summary".  RANGE OF MOTION AND STRENGTHENING EXERCISES  These exercises are designed to help you keep full movement of your hip joint. Follow your caregiver's or physical therapist's instructions. Perform all exercises about fifteen times, three times per day or as directed. Exercise both hips, even if you have had only one joint replacement. These exercises can be done on a training (exercise) mat, on the floor, on a table or on a bed. Use whatever works the best and is most comfortable for you. Use music or television while you are exercising so that the exercises are a pleasant break in your day. This will make your life better with the exercises acting as a break in routine you can look forward to.  Lying on your back, slowly slide your foot toward your buttocks, raising your knee up off the floor. Then slowly slide your foot back down until your leg is straight again.  Lying on your back spread your legs as far apart as you can without causing discomfort.  Lying on your side, raise your upper leg and foot straight up from the floor as far as is comfortable. Slowly lower the leg and repeat.  Lying on your back, tighten up the muscle in the front of your thigh (quadriceps muscles). You can do this by keeping your leg straight and trying to raise your heel off the floor. This helps strengthen the largest muscle supporting your knee.  Lying on your back, tighten up the muscles of your buttocks both with the legs straight and with the knee bent at a comfortable angle while keeping your heel on the floor.   IF YOU ARE TRANSFERRED TO A SKILLED REHAB FACILITY If the patient is transferred to a skilled rehab facility following release from the hospital, a list of the current medications will be sent to the facility for the patient to continue.  When discharged from the skilled rehab facility, please have the facility set up the patient's Home  Health Physical Therapy prior to being released. Also, the skilled facility will be responsible for providing the patient with their medications at time of release from the facility to include their pain medication, the muscle relaxants, and their blood thinner medication. If the patient is still at the rehab facility at time of the two week follow up appointment, the skilled rehab facility will also need to assist the patient in arranging follow up appointment in our office and any transportation needs.  MAKE SURE YOU:  Understand these instructions.  Get help right away if you are not doing well or get worse.    Pick up stool softner and laxative for home use following surgery while on pain medications. Do not submerge incision under water. Please use good hand washing techniques while changing dressing each day. May shower starting three days after surgery. Please use a clean towel to pat the incision dry following showers. Continue to use ice for pain and swelling after surgery. Do not use any lotions or creams on the incision until instructed by your surgeon.   Do not sit on low chairs, stoools or toilet seats, as it may be difficult to get up from low surfaces   Complete by: As directed    Driving restrictions   Complete by: As directed    No driving for two weeks   TED hose   Complete by: As directed    Use stockings (TED hose) for three weeks on both leg(s).  You may remove them at night for sleeping.   Weight bearing as tolerated  Complete by: As directed      Allergies as of 04/17/2019      Reactions   Penicillins Rash   DID THE REACTION INVOLVE: Swelling of the face/tongue/throat, SOB, or low BP? No Sudden or severe rash/hives, skin peeling, or the inside of the mouth or nose? Yes Did it require medical treatment? Yes When did it last happen?childhood allergy If all above answers are "NO", may proceed with cephalosporin use.   Sulfa Antibiotics Rash   Codeine  Nausea Only   Tramadol Nausea Only   Severe dizziness      Medication List    TAKE these medications   albuterol 108 (90 Base) MCG/ACT inhaler Commonly known as: VENTOLIN HFA Inhale 2 puffs into the lungs every 4 (four) hours as needed for wheezing or shortness of breath.   aspirin 325 MG EC tablet Take 1 tablet (325 mg total) by mouth 2 (two) times daily for 20 days. Take one tablet (325 mg) Aspirin two times a day for three weeks following surgery.Then take one baby Aspirin (81 mg) once a day for three weeks.Then discontinue aspirin.   budesonide-formoterol 160-4.5 MCG/ACT inhaler Commonly known as: Symbicort Inhale 2 puffs into the lungs 2 (two) times a day. 1-2 puffs twice daily   HYDROmorphone 2 MG tablet Commonly known as: DILAUDID Take 1-2 tablets (2-4 mg total) by mouth every 6 (six) hours as needed for moderate pain.   metFORMIN 500 MG tablet Commonly known as: GLUCOPHAGE TAKE 1 TABLET BY MOUTH TWICE A DAY WITH FOOD   methocarbamol 500 MG tablet Commonly known as: ROBAXIN Take 1 tablet (500 mg total) by mouth every 6 (six) hours as needed for muscle spasms.   nitroGLYCERIN 0.4 MG SL tablet Commonly known as: NITROSTAT Place 1 tablet (0.4 mg total) under the tongue every 5 (five) minutes as needed for chest pain.     ASK your doctor about these medications   hydrochlorothiazide 25 MG tablet Commonly known as: HYDRODIURIL TAKE 1 TABLET BY MOUTH EVERY DAY *INS ALLOWS 30 DAYS Ask about: Which instructions should I use?            Discharge Care Instructions  (From admission, onward)         Start     Ordered   04/17/19 0000  Weight bearing as tolerated     04/17/19 0742   04/17/19 0000  Change dressing    Comments: You may change your dressing on Wednesday, then change the dressing daily with sterile 4 x 4 inch gauze dressing and paper tape.   04/17/19 1610         Follow-up Information    Ollen Gross, MD. Schedule an appointment as soon as  possible for a visit on 05/01/2019.   Specialty: Orthopedic Surgery Contact information: [address] [place] [place] [address] [place] [address] [phone]           Signed: Dennie Bible, PA-C Orthopedic Surgery 04/18/2019, 12:55 PM

## 2019-04-19 ENCOUNTER — Encounter: Payer: PRIVATE HEALTH INSURANCE | Admitting: Family Medicine

## 2019-05-29 ENCOUNTER — Ambulatory Visit (INDEPENDENT_AMBULATORY_CARE_PROVIDER_SITE_OTHER): Payer: PRIVATE HEALTH INSURANCE | Admitting: Family Medicine

## 2019-05-29 ENCOUNTER — Encounter: Payer: Self-pay | Admitting: Family Medicine

## 2019-05-29 ENCOUNTER — Other Ambulatory Visit: Payer: Self-pay

## 2019-05-29 VITALS — Temp 100.8°F

## 2019-05-29 DIAGNOSIS — N390 Urinary tract infection, site not specified: Secondary | ICD-10-CM | POA: Diagnosis not present

## 2019-05-29 MED ORDER — CIPROFLOXACIN HCL 500 MG PO TABS: 500.0000 mg | ORAL_TABLET | Freq: Two times a day (BID) | ORAL | 0 refills | Status: DC

## 2019-05-29 NOTE — Progress Notes (Signed)
Virtual Visit via Video   I connected with patient on 05/29/19 at  9:00 AM EST by a video enabled telemedicine application and verified that I am speaking with the correct person using two identifiers.  Location patient: Home Location provider: Acup[facility]urist, Office Persons participating in the virtual visit: Patient, Provider, [place] (Jess B)  I discussed the limitations of evaluation and management by telemedicine and the availability of in person appointments. The patient expressed understanding and agreed to proceed.  Subjective:   HPI:   Urinary symptoms- sxs started yesterday at work w/ urinary frequency but decreased output.  Developed back pain after work.  + burning w/ urination.  + bladder incontinence.  Tm 101 last night.  No pain w/ sitting.  Denies soreness of testicles.  No visible blood.  Denies nausea/vomiting.  No recent changes to bladder habits.  ROS:   See pertinent positives and negatives per HPI.  Patient Active Problem List   Diagnosis Date Noted  . OA (osteoarthritis) of hip 04/16/2019  . Dysphagia 09/18/2018  . Lung nodules 09/18/2018  . Chest pain 07/11/2018  . Dyspnea 06/21/2018  . Physical exam 07/22/2017  . Adjustment disorder 03/17/2017  . Diabetes mellitus, type II ([place]) 07/29/2016  . Chronic cough 07/07/2016  . 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 Gastroesophageal reflux disease 05/12/2016  . Back pain 08/06/2015  . OSA (obstructive sleep apnea)   . HTN (hypertension)   . Morbid obesity ([place])     Social History   Tobacco Use  . Smoking status: Never Smoker  . Smokeless tobacco: Never Used  Substance Use Topics  . Alcohol use: No    Current Outpatient Medications:  .  albuterol (PROVENTIL HFA;VENTOLIN HFA) 108 (90 Base) MCG/ACT inhaler, Inhale 2 puffs into the lungs every 4 (four) hours as needed for wheezing or shortness of breath., Disp: 1 Inhaler, Rfl: 6 .  budesonide-formoterol (SYMBICORT) 160-4.5 MCG/ACT  inhaler, Inhale 2 puffs into the lungs 2 (two) times a day. 1-2 puffs twice daily, Disp: 1 Inhaler, Rfl: 6 .  hydrochlorothiazide (HYDRODIURIL) 25 MG tablet, TAKE 1 TABLET BY MOUTH EVERY DAY *INS ALLOWS 30 DAYS, Disp: 30 tablet, Rfl: 3 .  HYDROmorphone (DILAUDID) 2 MG tablet, Take 1-2 tablets (2-4 mg total) by mouth every 6 (six) hours as needed for moderate pain., Disp: 56 tablet, Rfl: 0 .  metFORMIN (GLUCOPHAGE) 500 MG tablet, TAKE 1 TABLET BY MOUTH TWICE A DAY WITH FOOD (Patient taking differently: Take 500 mg by mouth 2 (two) times daily with a meal. ), Disp: 60 tablet, Rfl: 6 .  methocarbamol (ROBAXIN) 500 MG tablet, Take 1 tablet (500 mg total) by mouth every 6 (six) hours as needed for muscle spasms., Disp: 40 tablet, Rfl: 0 .  nitroGLYCERIN (NITROSTAT) 0.4 MG SL tablet, Place 1 tablet (0.4 mg total) under the tongue every 5 (five) minutes as needed for chest pain., Disp: 90 tablet, Rfl: 3  Allergies  Allergen Reactions  . Penicillins Rash    DID THE REACTION INVOLVE: Swelling of the face/tongue/throat, SOB, or low BP? No Sudden or severe rash/hives, skin peeling, or the inside of the mouth or nose? Yes Did it require medical treatment? Yes When did it last happen?childhood allergy If all above answers are "NO", may proceed with cephalosporin use.    . Sulfa Antibiotics Rash  . Codeine Nausea Only  . Tramadol Nausea Only    Severe dizziness    Objective:   Temp (!) 100.8 F (38.2 C) (Oral)  AAOx3, NAD NCAT, EOMI No obvious CN deficits Coloring WNL Pt is able to speak clearly, coherently without shortness of breath or increased work of breathing.  Thought process is linear.  Mood is appropriate.   Assessment and Plan:   UTI- new.  pt's sxs are consistent w/ UTI.  Given that he is male, this is automatically a complicated UTI and I will tx w/ 7 days of Cipro.  If no improvement, will need UA and cx.  Pt advised to reach out ASAP if sxs change or worsen.  Pt expressed  understanding and is in agreement w/ plan.    Neena Rhymes, MD 05/29/2019

## 2019-05-29 NOTE — Progress Notes (Signed)
I have discussed the procedure for the virtual visit with the patient who has given consent to proceed with assessment and treatment.   Pt unable to obtain vitals.   Jessica L Brodmerkel, CMA     

## 2019-06-22 ENCOUNTER — Encounter: Payer: Self-pay | Admitting: Family Medicine

## 2019-06-22 ENCOUNTER — Other Ambulatory Visit: Payer: Self-pay

## 2019-06-22 ENCOUNTER — Ambulatory Visit (INDEPENDENT_AMBULATORY_CARE_PROVIDER_SITE_OTHER): Payer: PRIVATE HEALTH INSURANCE | Admitting: Family Medicine

## 2019-06-22 VITALS — BP 128/82 | HR 80 | Temp 97.9°F | Resp 16 | Ht 71.0 in | Wt 274.4 lb

## 2019-06-22 DIAGNOSIS — R310 Gross hematuria: Secondary | ICD-10-CM

## 2019-06-22 DIAGNOSIS — R350 Frequency of micturition: Secondary | ICD-10-CM | POA: Diagnosis not present

## 2019-06-22 DIAGNOSIS — E119 Type 2 diabetes mellitus without complications: Secondary | ICD-10-CM | POA: Diagnosis not present

## 2019-06-22 LAB — POCT URINALYSIS DIPSTICK
Bilirubin, UA: NEGATIVE
Blood, UA: NEGATIVE
Glucose, UA: NEGATIVE
Ketones, UA: NEGATIVE
Leukocytes, UA: NEGATIVE
Nitrite, UA: NEGATIVE
Protein, UA: NEGATIVE
Spec Grav, UA: 1.025 (ref 1.010–1.025)
Urobilinogen, UA: 0.2 E.U./dL
pH, UA: 6 (ref 5.0–8.0)

## 2019-06-22 NOTE — Assessment & Plan Note (Signed)
Chronic problem.  On Metformin.  UTD on foot exam, eye exam.  Due for microalbumin.  Stressed need for healthy diet and regular exercise.  Check labs.  Adjust meds prn

## 2019-06-22 NOTE — Progress Notes (Signed)
   Subjective:    Patient ID: Jordan Mcconnell, male    DOB: 06-Jan-1956, 63 y.o.   MRN: [id number]  HPI DM- chronic problem, on Metformin 500mg  BID.  UTD on foot exam, eye exam.  Due for microalbumin.  Weight is stable.  Pt is under considerable stress w/ caring for step-dad.  Pt is feeling 'run down.  Very tired'.  No CP, SOB above baseline, HAs, visual changes, abd pain, N/V, edema.  Denies symptomatic lows.  No numbness/tingling of hands/feet.  Increased urinary frequency- doesn't occur when up and moving around but occurs when sitting.  Will get up 2-4x/night. Did see blood in urine a few days ago- 'maybe 3 drops'.  No recurrence.   Review of Systems For ROS see HPI   This visit occurred during the SARS-CoV-2 public health emergency.  Safety protocols were in place, including screening questions prior to the visit, additional usage of staff PPE, and extensive cleaning of exam room while observing appropriate contact time as indicated for disinfecting solutions.       Objective:   Physical Exam Vitals reviewed.  Constitutional:      General: He is not in acute distress.    Appearance: He is well-developed. He is obese.  HENT:     Head: Normocephalic and atraumatic.  Eyes:     Conjunctiva/sclera: Conjunctivae normal.     Pupils: Pupils are equal, round, and reactive to light.  Neck:     Thyroid: No thyromegaly.  Cardiovascular:     Rate and Rhythm: Normal rate and regular rhythm.     Heart sounds: Normal heart sounds. No murmur.  Pulmonary:     Effort: Pulmonary effort is normal. No respiratory distress.     Breath sounds: Normal breath sounds.  Abdominal:     General: Bowel sounds are normal. There is no distension.     Palpations: Abdomen is soft.  Musculoskeletal:     Cervical back: Normal range of motion and neck supple.  Lymphadenopathy:     Cervical: No cervical adenopathy.  Skin:    General: Skin is warm and dry.  Neurological:     Mental Status: He is alert and  oriented to person, place, and time.     Cranial Nerves: No cranial nerve deficit.  Psychiatric:        Behavior: Behavior normal.           Assessment & Plan:  Hematuria- pt reports gross hematuria 2-3 days ago x1 but nothing since.  Will get UA to assess and refer to urology for complete evaluation  Urinary frequency- new.  Pt reports since recent UTI he has had increased frequency.  No issues when up and about but the increased pressure when sitting causes increased, low volume urination.  Also having nocturia.  Will refer to urology for complete evaluation and tx.  Pt expressed understanding and is in agreement w/ plan.

## 2019-06-22 NOTE — Patient Instructions (Addendum)
Follow up in 3-4 months to recheck diabetes, BP, and cholesterol We'll notify you of your lab results and make any changes if needed We'll call you with your urology referral Continue to work on healthy diet and regular exercise- you can do it! Call with any questions or concerns Stay Safe!  Stay Healthy! Merry Christmas!!

## 2019-06-23 LAB — HEMOGLOBIN A1C
Hgb A1c MFr Bld: 6.8 % of total Hgb — ABNORMAL HIGH (ref <5.7)
Mean Plasma Glucose: 148 (calc)
eAG (mmol/L): 8.2 (calc)

## 2019-06-23 LAB — BASIC METABOLIC PANEL
BUN: 17 mg/dL (ref 7–25)
CO2: 26 mmol/L (ref 20–32)
Calcium: 10 mg/dL (ref 8.6–10.3)
Chloride: 98 mmol/L (ref 98–110)
Creat: 0.86 mg/dL (ref 0.70–1.25)
Glucose, Bld: 105 mg/dL — ABNORMAL HIGH (ref 65–99)
Potassium: 4.2 mmol/L (ref 3.5–5.3)
Sodium: 138 mmol/L (ref 135–146)

## 2019-06-23 LAB — MICROALBUMIN / CREATININE URINE RATIO
Creatinine, Urine: 85 mg/dL (ref 20–320)
Microalb Creat Ratio: 4 mcg/mg creat (ref <30)
Microalb, Ur: 0.3 mg/dL

## 2019-06-25 ENCOUNTER — Encounter: Payer: Self-pay | Admitting: General Practice

## 2019-07-06 ENCOUNTER — Other Ambulatory Visit: Payer: Self-pay

## 2019-07-06 ENCOUNTER — Encounter (HOSPITAL_COMMUNITY): Payer: Self-pay | Admitting: *Deleted

## 2019-07-06 ENCOUNTER — Emergency Department (HOSPITAL_COMMUNITY)
Admission: EM | Admit: 2019-07-06 | Discharge: 2019-07-06 | Disposition: A | Discharge status: Home / Self Care | Payer: PRIVATE HEALTH INSURANCE | Attending: Emergency Medicine | Admitting: Emergency Medicine

## 2019-07-06 ENCOUNTER — Emergency Department (HOSPITAL_COMMUNITY): Payer: PRIVATE HEALTH INSURANCE

## 2019-07-06 DIAGNOSIS — E119 Type 2 diabetes mellitus without complications: Secondary | ICD-10-CM | POA: Diagnosis not present

## 2019-07-06 DIAGNOSIS — Z96642 Presence of left artificial hip joint: Secondary | ICD-10-CM | POA: Insufficient documentation

## 2019-07-06 DIAGNOSIS — R0602 Shortness of breath: Secondary | ICD-10-CM | POA: Insufficient documentation

## 2019-07-06 DIAGNOSIS — Z79899 Other long term (current) drug therapy: Secondary | ICD-10-CM | POA: Diagnosis not present

## 2019-07-06 DIAGNOSIS — Z96651 Presence of right artificial knee joint: Secondary | ICD-10-CM | POA: Insufficient documentation

## 2019-07-06 DIAGNOSIS — R509 Fever, unspecified: Secondary | ICD-10-CM | POA: Insufficient documentation

## 2019-07-06 DIAGNOSIS — R5383 Other fatigue: Secondary | ICD-10-CM | POA: Insufficient documentation

## 2019-07-06 DIAGNOSIS — I10 Essential (primary) hypertension: Secondary | ICD-10-CM | POA: Diagnosis not present

## 2019-07-06 DIAGNOSIS — R630 Anorexia: Secondary | ICD-10-CM | POA: Diagnosis not present

## 2019-07-06 DIAGNOSIS — Z96652 Presence of left artificial knee joint: Secondary | ICD-10-CM | POA: Insufficient documentation

## 2019-07-06 DIAGNOSIS — Z7984 Long term (current) use of oral hypoglycemic drugs: Secondary | ICD-10-CM | POA: Insufficient documentation

## 2019-07-06 DIAGNOSIS — R0789 Other chest pain: Secondary | ICD-10-CM | POA: Diagnosis not present

## 2019-07-06 DIAGNOSIS — U071 COVID-19: Secondary | ICD-10-CM | POA: Diagnosis not present

## 2019-07-06 DIAGNOSIS — R05 Cough: Secondary | ICD-10-CM | POA: Diagnosis present

## 2019-07-06 LAB — CBC WITH DIFFERENTIAL/PLATELET
Abs Immature Granulocytes: 0.03 10*3/uL (ref 0.00–0.07)
Basophils Absolute: 0 10*3/uL (ref 0.0–0.1)
Basophils Relative: 0 %
Eosinophils Absolute: 0 10*3/uL (ref 0.0–0.5)
Eosinophils Relative: 0 %
HCT: 47.7 % (ref 39.0–52.0)
Hemoglobin: 15.6 g/dL (ref 13.0–17.0)
Immature Granulocytes: 1 %
Lymphocytes Relative: 30 %
Lymphs Abs: 1.4 10*3/uL (ref 0.7–4.0)
MCH: 28.4 pg (ref 26.0–34.0)
MCHC: 32.7 g/dL (ref 30.0–36.0)
MCV: 86.7 fL (ref 80.0–100.0)
Monocytes Absolute: 0.3 10*3/uL (ref 0.1–1.0)
Monocytes Relative: 7 %
Neutro Abs: 2.9 10*3/uL (ref 1.7–7.7)
Neutrophils Relative %: 62 %
Platelets: 116 10*3/uL — ABNORMAL LOW (ref 150–400)
RBC: 5.5 MIL/uL (ref 4.22–5.81)
RDW: 13.5 % (ref 11.5–15.5)
WBC: 4.7 10*3/uL (ref 4.0–10.5)
nRBC: 0 % (ref 0.0–0.2)

## 2019-07-06 LAB — COMPREHENSIVE METABOLIC PANEL
ALT: 50 U/L — ABNORMAL HIGH (ref 0–44)
AST: 36 U/L (ref 15–41)
Albumin: 3.9 g/dL (ref 3.5–5.0)
Alkaline Phosphatase: 71 U/L (ref 38–126)
Anion gap: 9 (ref 5–15)
BUN: 18 mg/dL (ref 8–23)
CO2: 25 mmol/L (ref 22–32)
Calcium: 8.7 mg/dL — ABNORMAL LOW (ref 8.9–10.3)
Chloride: 105 mmol/L (ref 98–111)
Creatinine, Ser: 0.69 mg/dL (ref 0.61–1.24)
GFR calc Af Amer: >60 mL/min (ref >60)
GFR calc non Af Amer: >60 mL/min (ref >60)
Glucose, Bld: 131 mg/dL — ABNORMAL HIGH (ref 70–99)
Potassium: 3.6 mmol/L (ref 3.5–5.1)
Sodium: 139 mmol/L (ref 135–145)
Total Bilirubin: 0.9 mg/dL (ref 0.3–1.2)
Total Protein: 7 g/dL (ref 6.5–8.1)

## 2019-07-06 LAB — POC SARS CORONAVIRUS 2 AG -  ED: SARS Coronavirus 2 Ag: POSITIVE — AB

## 2019-07-06 NOTE — Discharge Instructions (Signed)
Please read attachment on COVID-19 discharge instructions.  Maintain isolation precautions.  Please continue Tylenol or ibuprofen as needed for your fevers and chills.  Consider over-the-counter medications for symptomatic relief of your cough symptoms such as Delsym or Mucinex.  Recommend warm tea or throat lozenges for sore throat symptoms.  Encourage you to obtain pulse oximeter at pharmacy.  Please return to the ED or seek medical attention for any new or worsening symptoms.

## 2019-07-06 NOTE — ED Provider Notes (Signed)
[facility] DEPT Provider Note   CSN: [id number] Arrival date & time: 07/06/19  1029     History Chief Complaint  Patient presents with  . COVID +  . Shortness of Breath    Jordan Mcconnell is a 63 y.o. male with PMH significant for type II DM, HTN, and OSA who presents with a 2-day history of nonproductive cough, chest tightness, progressively worsening shortness of breath, and intermittent fever.  He also endorses diminished appetite and fatigue.  Patient reports that he had a COVID-19 exposure at his daughter's house recently.  He denies any headache or dizziness, chest pain, abdominal pain, nausea or vomiting, or changes in urinary or bowel habits.    HPI     Past Medical History:  Diagnosis Date  . Arthritis   . Complication of anesthesia    one surgery pt. states he was hard to wake up  . Diabetes mellitus without complication ([place])    type 2  . GERD (gastroesophageal reflux disease)   . HTN (hypertension)   . 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 Obesity   . OSA (obstructive sleep apnea)    uses CPAP  . PONV (postoperative nausea and vomiting)     Patient Active Problem List   Diagnosis Date Noted  . OA (osteoarthritis) of hip 04/16/2019  . Dysphagia 09/18/2018  . Lung nodules 09/18/2018  . Chest pain 07/11/2018  . Dyspnea 06/21/2018  . Physical exam 07/22/2017  . Adjustment disorder 03/17/2017  . Diabetes mellitus, type II ([place]) 07/29/2016  . Chronic cough 07/07/2016  . 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 Gastroesophageal reflux disease 05/12/2016  . Back pain 08/06/2015  . OSA (obstructive sleep apnea)   . HTN (hypertension)   . Morbid obesity ([place])     Past Surgical History:  Procedure Laterality Date  . BACK SURGERY  08/2015  . COLONOSCOPY    . ESOPHAGEAL DILATION     1 or 2 times  . ESOPHAGOGASTRODUODENOSCOPY N/A 10/30/2013   Procedure: ESOPHAGOGASTRODUODENOSCOPY (EGD);  Surgeon:  Garlan Fair, MD;  Location: [facility];  Service: Endoscopy;  Laterality: N/A;  . JOINT REPLACEMENT     bilateral knees  . LEFT HEART CATH AND CORONARY ANGIOGRAPHY N/A 07/13/2018   Procedure: LEFT HEART CATH AND CORONARY ANGIOGRAPHY;  Surgeon: Jettie Booze, MD;  Location: [facility];  Service: Cardiovascular;  Laterality: N/A;  . SHOULDER ARTHROSCOPY     Bilateral 3 times each shoulder  . TOTAL HIP ARTHROPLASTY Left 04/16/2019   Procedure: TOTAL HIP ARTHROPLASTY ANTERIOR APPROACH;  Surgeon: Gaynelle Arabian, MD;  Location: [facility];  Service: Orthopedics;  Laterality: Left;  169mn       Family History  Problem Relation Age of Onset  . Memory loss Mother   . Heart murmur Mother   . Colon cancer Father   . Stroke Father   . Cancer Father        colon  . Dementia Father   . Cancer Maternal Grandfather        lung and bone  . Memory loss Maternal Grandmother     Social History   Tobacco Use  . Smoking status: Never Smoker  . Smokeless tobacco: Never Used  Substance Use Topics  . Alcohol use: No  . Drug use: No    Home Medications Prior to Admission medications   Medication Sig Start Date End Date Taking? Authorizing Provider  acetaminophen (TYLENOL) 325 MG tablet Take 650  mg by mouth every 6 (six) hours as needed for fever.   Yes [provider]  albuterol (PROVENTIL HFA;VENTOLIN HFA) 108 (90 Base) MCG/ACT inhaler Inhale 2 puffs into the lungs every 4 (four) hours as needed for wheezing or shortness of breath. 08/04/18  Yes Lauraine Rinne, NP  budesonide-formoterol (SYMBICORT) 160-4.5 MCG/ACT inhaler Inhale 2 puffs into the lungs 2 (two) times a day. 1-2 puffs twice daily 01/11/19  Yes Icard, Bradley L, DO  hydrochlorothiazide (HYDRODIURIL) 25 MG tablet TAKE 1 TABLET BY MOUTH EVERY DAY *INS ALLOWS 30 DAYS Patient taking differently: Take 25 mg by mouth daily.  04/17/19  Yes Turner, Eber Hong, MD  metFORMIN (GLUCOPHAGE) 500 MG tablet TAKE 1 TABLET BY MOUTH  TWICE A DAY WITH FOOD Patient taking differently: Take 500 mg by mouth 2 (two) times daily with a meal.  02/19/19  Yes Midge Minium, MD  nitroGLYCERIN (NITROSTAT) 0.4 MG SL tablet Place 1 tablet (0.4 mg total) under the tongue every 5 (five) minutes as needed for chest pain. 07/11/18 07/06/19 Yes Imogene Burn, PA-C    Allergies    Penicillins, Sulfa antibiotics, Codeine, and Tramadol  Review of Systems   Review of Systems  All other systems reviewed and are negative.   Physical Exam Updated Vital Signs BP 121/75   Pulse 87   Temp 97.9 F (36.6 C) (Oral)   Resp 18   Ht _0  (1.803 m)   Wt 122.5 kg   SpO2 99%   BMI 37.66 kg/m   Physical Exam Vitals and nursing note reviewed. Exam conducted with a chaperone present.  Constitutional:      Appearance: Normal appearance.  HENT:     Head: Normocephalic and atraumatic.  Eyes:     General: No scleral icterus.    Conjunctiva/sclera: Conjunctivae normal.  Cardiovascular:     Rate and Rhythm: Normal rate and regular rhythm.     Pulses: Normal pulses.     Heart sounds: Normal heart sounds.  Pulmonary:     Comments: Shallow breathing, mildly increased effort.  Breath sounds intact bilaterally.  No respiratory distress, wheezes, rales, or other abnormal breath sounds. Abdominal:     General: Abdomen is flat. There is no distension.     Palpations: Abdomen is soft.     Tenderness: There is no abdominal tenderness. There is no guarding.  Musculoskeletal:     Cervical back: Normal range of motion and neck supple.  Skin:    General: Skin is dry.  Neurological:     General: No focal deficit present.     Mental Status: He is alert and oriented to person, place, and time.     GCS: GCS eye subscore is 4. GCS verbal subscore is 5. GCS motor subscore is 6.  Psychiatric:        Mood and Affect: Mood normal.        Behavior: Behavior normal.        Thought Content: Thought content normal.      ED Results / Procedures /  Treatments   Labs (all labs ordered are listed, but only abnormal results are displayed) Labs Reviewed  CBC WITH DIFFERENTIAL/PLATELET - Abnormal; Notable for the following components:      Result Value   Platelets 116 (*)    All other components within normal limits  COMPREHENSIVE METABOLIC PANEL - Abnormal; Notable for the following components:   Glucose, Bld 131 (*)    Calcium 8.7 (*)    ALT  50 (*)    All other components within normal limits  POC SARS CORONAVIRUS 2 AG -  ED - Abnormal; Notable for the following components:   SARS Coronavirus 2 Ag POSITIVE (*)    All other components within normal limits    EKG EKG Interpretation  Date/Time:  Friday July 06 2019 10:55:44 EST Ventricular Rate:  78 PR Interval:    QRS Duration: 99 QT Interval:  387 QTC Calculation: 441 R Axis:   52 Text Interpretation: Sinus rhythm Confirmed by Nat Christen [phone]) on 07/06/2019 2:35:56 PM   Radiology DG Chest Portable 1 View  Result Date: 07/06/2019 CLINICAL DATA:  Cough and shortness of breath EXAM: PORTABLE CHEST 1 VIEW COMPARISON:  06/21/2018 FINDINGS: Cardiac shadow is mildly enlarged but accentuated by the portable technique. The lungs are hypoinflated. No focal infiltrate or sizable effusion is seen. No bony abnormality is noted. IMPRESSION: No acute abnormality noted. Electronically Signed   By: Inez Catalina M.D.   On: 07/06/2019 11:41    Procedures Procedures (including critical care time)  Medications Ordered in ED Medications - No data to display  ED Course  I have reviewed the triage vital signs and the nursing notes.  Pertinent labs & imaging results that were available during my care of the patient were reviewed by me and considered in my medical decision making (see chart for details).    MDM Rules/Calculators/A&P                      DG chest demonstrates no acute cardiopulmonary abnormalities or concern for pneumonia.  Patient tested positive for COVID-19 which  likely explains his symptoms of shortness of breath, chest tightness, intermittent fever, and cough.  During his duration here in the ED, his vital signs were stable and normal limits and his oxygenation has not dipped below 98% while at rest on room air.  Patient was ambulated by Tai RN on pulse oximetry and he was able to maintain 96% oxygen saturation with no significant increased work of breathing.   At this time, Dr. Lacinda Axon and I feel he is safe for discharge.  Encouraging strict isolation precautions.  Over-the-counter medications for symptomatic relief of his COVID-19 symptoms.  Patient states that he has rescue inhaler already at home.  He is pleased that he does not need to be admitted at this time.  Strict return precautions discussed.   Patient voiced understanding and is agreeable to the plan.    Jordan Mcconnell was evaluated in Emergency Department on 07/06/2019 for the symptoms described in the history of present illness. He was evaluated in the context of the global COVID-19 pandemic, which necessitated consideration that the patient might be at risk for infection with the SARS-CoV-2 virus that causes COVID-19. Institutional protocols and algorithms that pertain to the evaluation of patients at risk for COVID-19 are in a state of rapid change based on information released by regulatory bodies including the CDC and federal and state organizations. These policies and algorithms were followed during the patient's care in the ED.   Final Clinical Impression(s) / ED Diagnoses Final diagnoses:  [id number]    Rx / DC Orders ED Discharge Orders    None       Corena Herter, PA-C 07/06/19 1501    Nat Christen, MD 07/06/19 1544

## 2019-07-06 NOTE — ED Triage Notes (Signed)
Pt states he has been feeling SOB with movement yesterday.  Pt states that he feels dizzy when he gets up when he starts coughing.  Pt states he has a chest pain rating a 6/7 out of 10 but increases with movement. Pt states he recently was supposed to get a CT scan with contrast for his urinary area due to an episode of blood upon using the bathroom. Hx UTI Pt states he's been using his CPAP machine even when he's not sleeping for the past two days.  Pt states that helps him breathe.

## 2019-07-06 NOTE — ED Notes (Signed)
Pt ambulated in the room and was able to maintain O2 above 96% RA.  PA made aware.

## 2019-07-07 ENCOUNTER — Other Ambulatory Visit: Payer: Self-pay | Admitting: Unknown Physician Specialty

## 2019-07-07 ENCOUNTER — Telehealth: Payer: Self-pay | Admitting: Unknown Physician Specialty

## 2019-07-07 DIAGNOSIS — U071 COVID-19: Secondary | ICD-10-CM

## 2019-07-07 NOTE — Telephone Encounter (Signed)
  I connected by phone with Jordan Mcconnell on 07/07/2019 at 11:59 AM to discuss the potential use of an new treatment for mild to moderate COVID-19 viral infection in non-hospitalized patients.  This patient is a 64 y.o. male that meets the FDA criteria for Emergency Use Authorization of bamlanivimab or casirivimab\imdevimab.  Has a (+) direct SARS-CoV-2 viral test result  Has mild or moderate COVID-19   Is ? 62 years of age and weighs ? 40 kg  Is NOT hospitalized due to COVID-19  Is NOT requiring oxygen therapy or requiring an increase in baseline oxygen flow rate due to COVID-19  Is within 10 days of symptom onset  Has at least one of the high risk factor(s) for progression to severe COVID-19 and/or hospitalization as defined in EUA.  Specific high risk criteria : Diabetes   I have spoken and communicated the following to the patient or parent/caregiver:  1. FDA has authorized the emergency use of bamlanivimab and casirivimab\imdevimab for the treatment of mild to moderate COVID-19 in adults and pediatric patients with positive results of direct SARS-CoV-2 viral testing who are 51 years of age and older weighing at least 40 kg, and who are at high risk for progressing to severe COVID-19 and/or hospitalization.  2. The significant known and potential risks and benefits of bamlanivimab and casirivimab\imdevimab, and the extent to which such potential risks and benefits are unknown.  3. Information on available alternative treatments and the risks and benefits of those alternatives, including clinical trials.  4. Patients treated with bamlanivimab and casirivimab\imdevimab should continue to self-isolate and use infection control measures (e.g., wear mask, isolate, social distance, avoid sharing personal items, clean and disinfect "high touch" surfaces, and frequent handwashing) according to CDC guidelines.   5. The patient or parent/caregiver has the option to accept or refuse  bamlanivimab or casirivimab\imdevimab .  After reviewing this information with the patient, The patient agreed to proceed with receiving the bamlanimivab infusion and will be provided a copy of the Fact sheet prior to receiving the infusion.Gabriel Cirri 07/07/2019 11:59 AM  Symptom onset 12/30 fever and cough

## 2019-07-07 NOTE — Telephone Encounter (Signed)
Called to discuss with patient about Covid symptoms and the use of bamlanivimab, a monoclonal antibody infusion for those with mild to moderate Covid symptoms and at a high risk of hospitalization.  Pt is qualified for this infusion at the [facility] due to Diabetes   Message left to call back  

## 2019-07-11 ENCOUNTER — Ambulatory Visit (HOSPITAL_COMMUNITY)
Admission: RE | Admit: 2019-07-11 | Discharge: 2019-07-11 | Disposition: A | Discharge status: Home / Self Care | Payer: PRIVATE HEALTH INSURANCE | Source: Ambulatory Visit | Attending: Pulmonary Disease | Admitting: Pulmonary Disease

## 2019-07-11 DIAGNOSIS — U071 COVID-19: Secondary | ICD-10-CM | POA: Insufficient documentation

## 2019-07-11 DIAGNOSIS — Z23 Encounter for immunization: Secondary | ICD-10-CM | POA: Diagnosis not present

## 2019-07-11 MED ORDER — SODIUM CHLORIDE 0.9 % IV SOLN
700.0000 mg | Freq: Once | INTRAVENOUS | Status: AC
  Administered 2019-07-11: 700 mg via INTRAVENOUS
  Filled 2019-07-11: qty 20

## 2019-07-11 MED ORDER — FAMOTIDINE IN NACL 20-0.9 MG/50ML-% IV SOLN: 20.0000 mg | Freq: Once | INTRAVENOUS | Status: DC | PRN

## 2019-07-11 MED ORDER — EPINEPHRINE 0.3 MG/0.3ML IJ SOAJ: 0.3000 mg | Freq: Once | INTRAMUSCULAR | Status: DC | PRN

## 2019-07-11 MED ORDER — DIPHENHYDRAMINE HCL 50 MG/ML IJ SOLN: 50.0000 mg | Freq: Once | INTRAMUSCULAR | Status: DC | PRN

## 2019-07-11 MED ORDER — ALBUTEROL SULFATE HFA 108 (90 BASE) MCG/ACT IN AERS: 2.0000 | INHALATION_SPRAY | Freq: Once | RESPIRATORY_TRACT | Status: DC | PRN

## 2019-07-11 MED ORDER — SODIUM CHLORIDE 0.9 % IV SOLN: INTRAVENOUS | Status: DC | PRN

## 2019-07-11 MED ORDER — METHYLPREDNISOLONE SODIUM SUCC 125 MG IJ SOLR: 125.0000 mg | Freq: Once | INTRAMUSCULAR | Status: DC | PRN

## 2019-07-11 NOTE — Progress Notes (Signed)
  Diagnosis: COVID-19  Physician: Dr. Wright  Procedure: [facility] Med: bamlanivimab infusion - Provided patient with bamlanimivab fact sheet for patients, parents and caregivers prior to infusion.  Complications: No immediate complications noted.  Discharge: Discharged home   Jordan Mcconnell, Jordan Mcconnell 07/11/2019  

## 2019-07-11 NOTE — Discharge Instructions (Signed)

## 2019-08-13 ENCOUNTER — Other Ambulatory Visit: Payer: Self-pay | Admitting: Cardiology

## 2019-08-28 ENCOUNTER — Ambulatory Visit (INDEPENDENT_AMBULATORY_CARE_PROVIDER_SITE_OTHER)
Admission: RE | Admit: 2019-08-28 | Discharge: 2019-08-28 | Disposition: A | Discharge status: Home / Self Care | Payer: PRIVATE HEALTH INSURANCE | Source: Ambulatory Visit | Attending: Pulmonary Disease | Admitting: Pulmonary Disease

## 2019-08-28 ENCOUNTER — Other Ambulatory Visit: Payer: Self-pay

## 2019-08-28 DIAGNOSIS — R918 Other nonspecific abnormal finding of lung field: Secondary | ICD-10-CM | POA: Diagnosis not present

## 2019-08-30 IMAGING — CT CT CHEST HIGH RESOLUTION W/O CM
2 of 7 series · 14 of 36 positions shown, 17 images · non-contrast
Comparison: No priors.

CLINICAL DATA: 63-year-old male with history of shortness of breath
and dyspnea on exertion, worsening over the past year.

EXAM:
CT CHEST WITHOUT CONTRAST
TECHNIQUE: Multidetector CT imaging of the chest was performed following the
standard protocol without intravenous contrast. High resolution
imaging of the lungs, as well as inspiratory and expiratory imaging,
was performed.

[Series 2: high resolution · axial · 0.83mm/px · z∈[-207,+19]mm · 11 of 127 slices shown, 14 images]
[im 7/127  mediastinal]
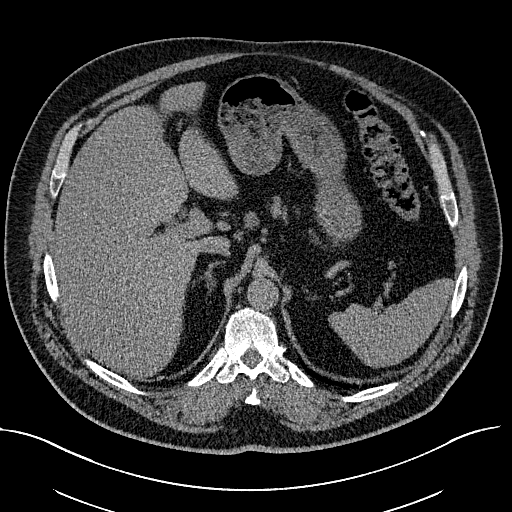
[im 7/127  lung]
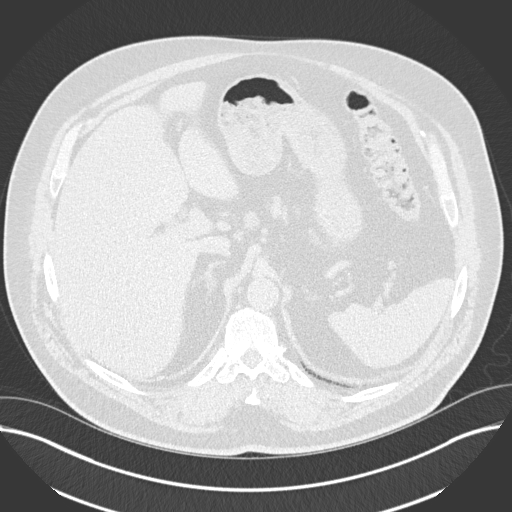
[im 20/127  lung]
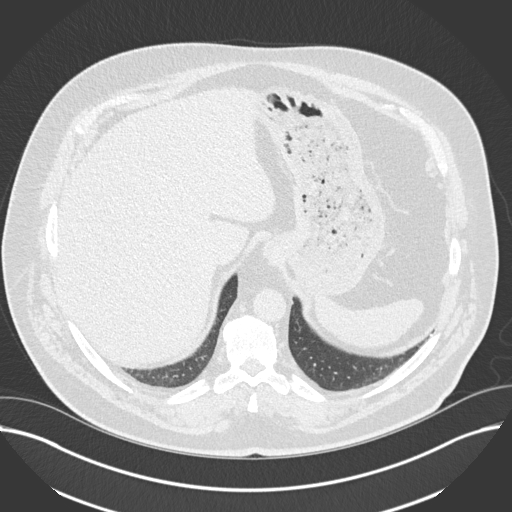
[im 34/127  lung]
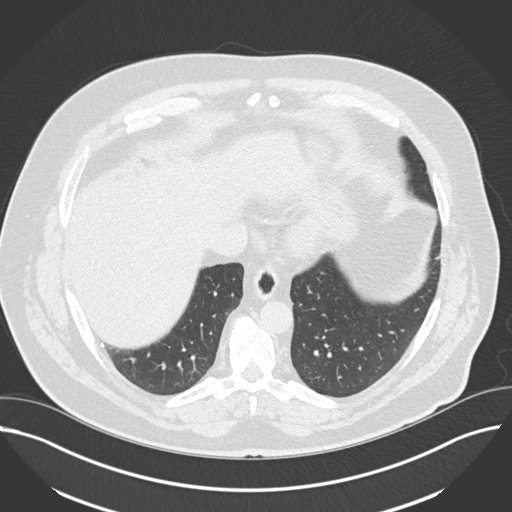
[im 40/127  lung]
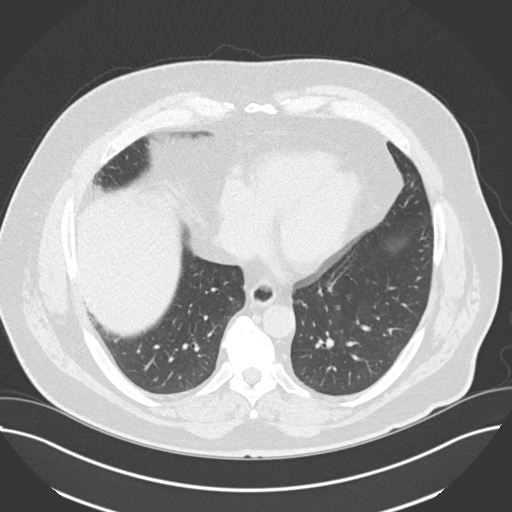
[im 54/127  mediastinal]
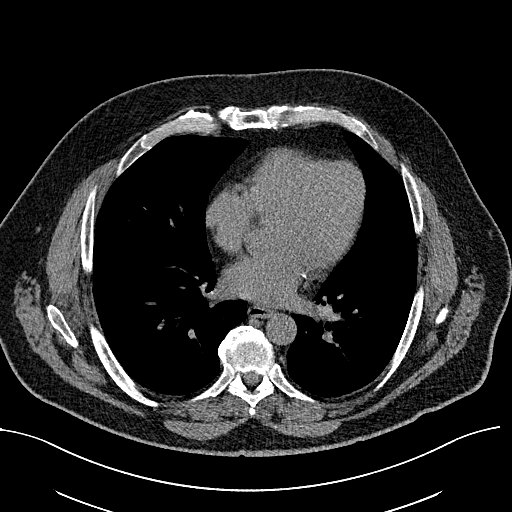
[im 54/127  lung]
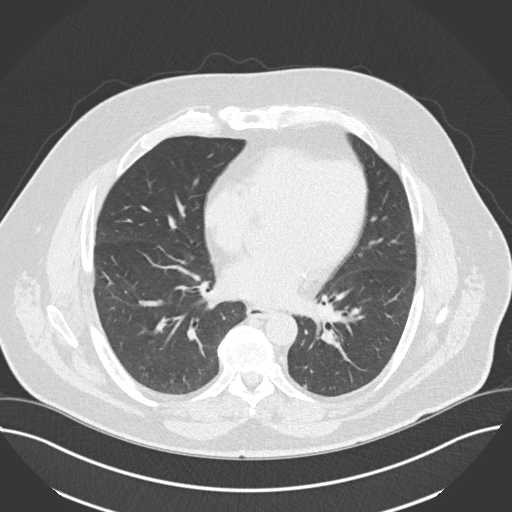
[im 67/127  lung]
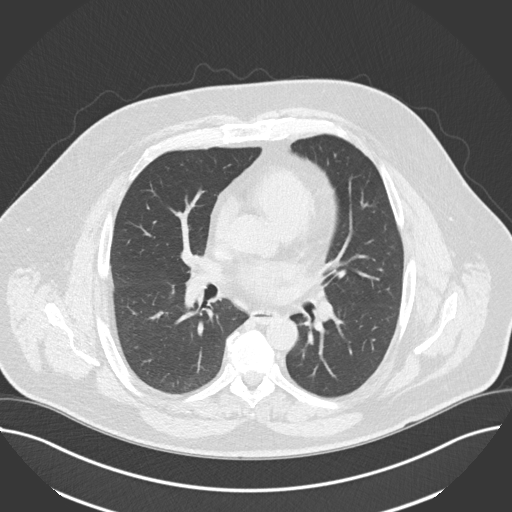
[im 73/127  lung]
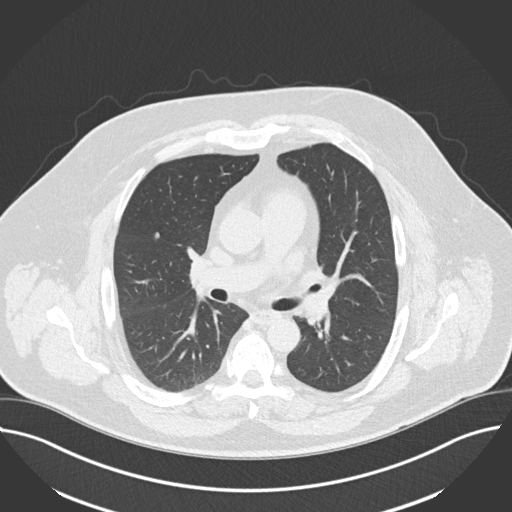
[im 87/127  lung]
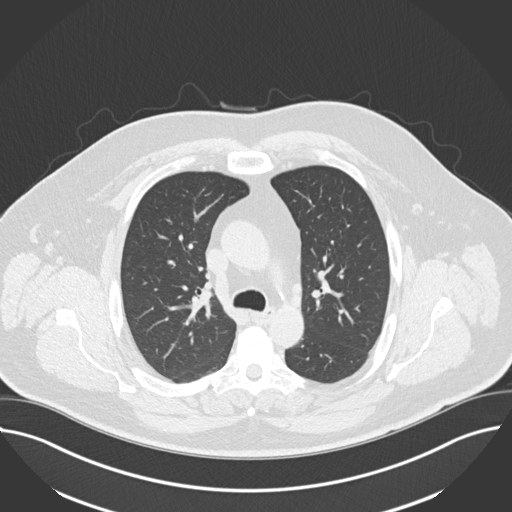
[im 93/127  mediastinal]
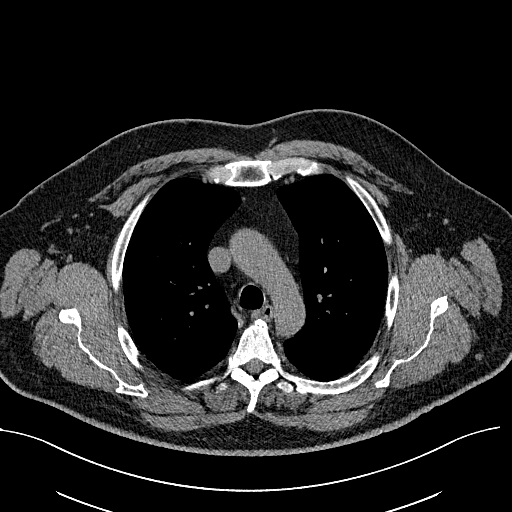
[im 93/127  lung]
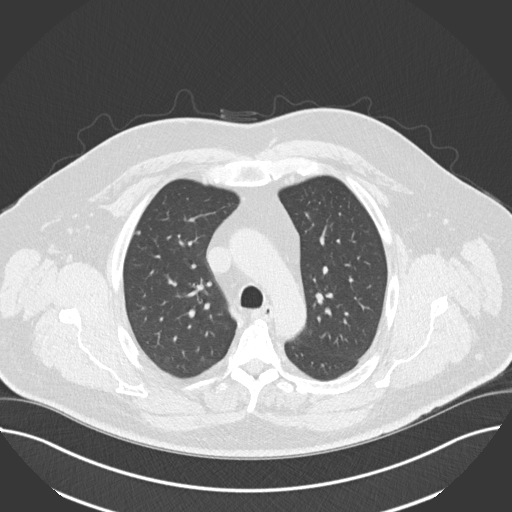
[im 107/127  lung]
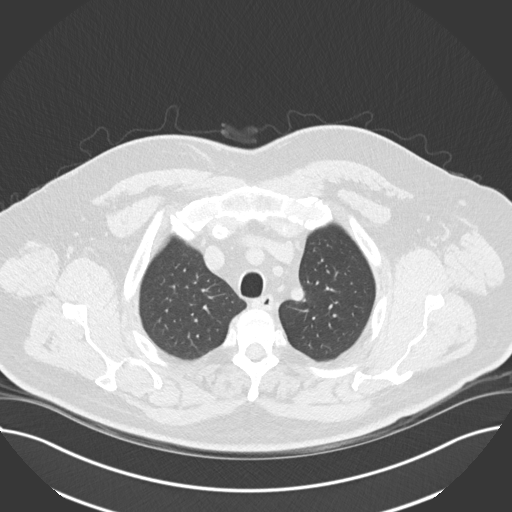
[im 120/127  lung]
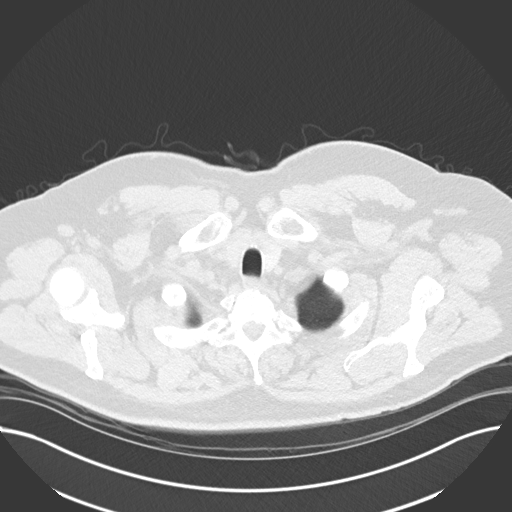

[Series 12: coronal · coronal · 0.50mm/px · 3 of 151 slices shown]
[im 31/151  lung]
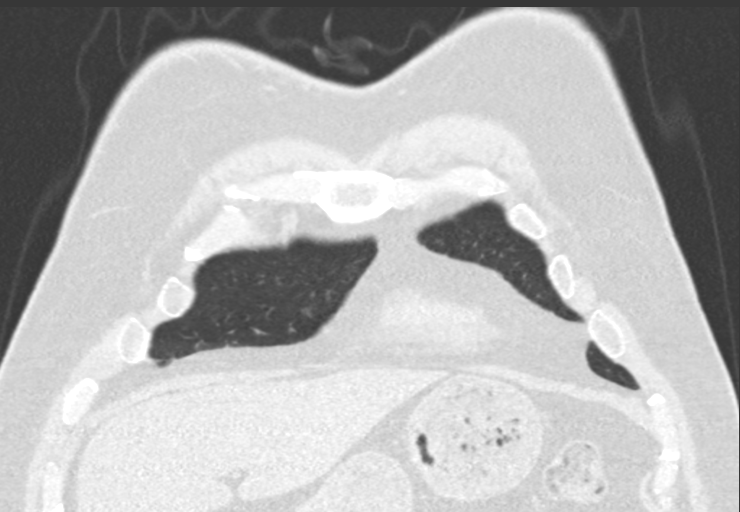
[im 61/151  lung]
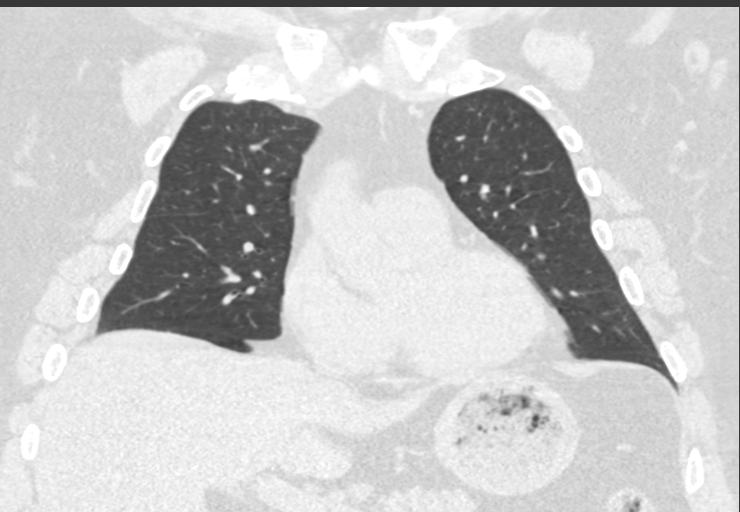
[im 91/151  lung]
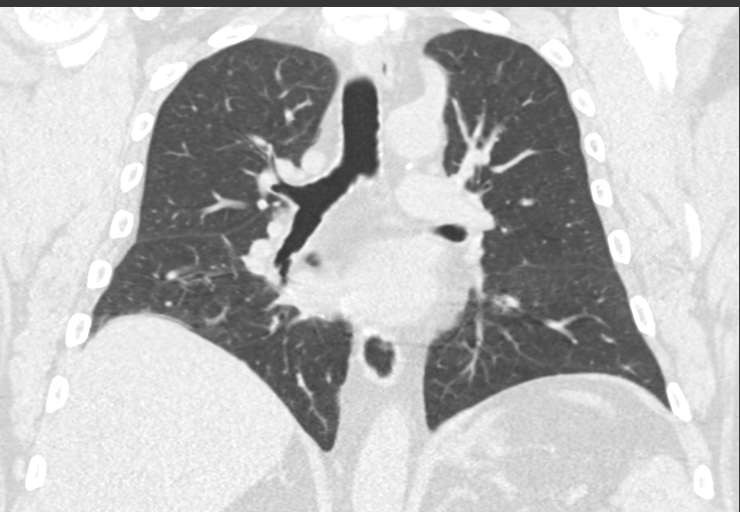

[14 of 36 positions shown; findings below may reference images not displayed]

FINDINGS: Cardiovascular: Heart size is normal. There is no significant
pericardial fluid, thickening or pericardial calcification. There is
aortic atherosclerosis, as well as atherosclerosis of the great
vessels of the mediastinum and the coronary arteries, including
calcified atherosclerotic plaque in the left anterior descending and
left circumflex coronary arteries.

Mediastinum/Nodes: No pathologically enlarged mediastinal or hilar
lymph nodes. Please note that accurate exclusion of hilar adenopathy
is limited on noncontrast CT scans. Esophagus is unremarkable in
appearance. No axillary lymphadenopathy.

Lungs/Pleura: High-resolution images demonstrate no significant
regions of ground-glass attenuation, subpleural reticulation,
parenchymal banding, traction bronchiectasis or frank honeycombing.
Inspiratory and expiratory imaging demonstrates very mild air
trapping, indicative of mild small airways disease. No acute
consolidative airspace disease. No pleural effusions. Several tiny
3-5 mm pulmonary nodules scattered throughout both lungs,
nonspecific.

Upper Abdomen: Diffuse low attenuation throughout the visualized
hepatic parenchyma, indicative of hepatic steatosis.

Musculoskeletal: There are no aggressive appearing lytic or blastic
lesions noted in the visualized portions of the skeleton.
IMPRESSION: 1. No findings to suggest interstitial lung disease.
2. Mild air trapping, indicative of mild small airways disease.
3. Aortic atherosclerosis, in addition to 2 vessel coronary artery
disease. Please note that although the presence of coronary artery
calcium documents the presence of coronary artery disease, the
severity of this disease and any potential stenosis cannot be
assessed on this non-gated CT examination. Assessment for potential
risk factor modification, dietary therapy or pharmacologic therapy
may be warranted, if clinically indicated.
4. Multiple small 3-5 mm pulmonary nodules in the lungs bilaterally,
nonspecific, but statistically likely benign. No follow-up needed if
patient is low-risk (and has no known or suspected primary
neoplasm). Non-contrast chest CT can be considered in 12 months if
patient is high-risk. This recommendation follows the consensus
statement: Guidelines for Management of Incidental Pulmonary Nodules
Detected on CT Images: From the [HOSPITAL] 6903; Radiology
6903; [DATE].
5. Hepatic steatosis.

Aortic Atherosclerosis ([id number]).

## 2019-09-04 ENCOUNTER — Telehealth: Payer: Self-pay | Admitting: Pulmonary Disease

## 2019-09-04 NOTE — Telephone Encounter (Signed)
Called and spoke with patient's wife, Meriam Sprague ([place]).  CT results and recommendations from Dr. Tonia Brooms given.  Understanding stated.  Patient scheduled follow up 10/17/19 at 0930.  Nothing further at this time.

## 2019-09-11 ENCOUNTER — Other Ambulatory Visit: Payer: Self-pay | Admitting: Cardiology

## 2019-09-28 ENCOUNTER — Other Ambulatory Visit: Payer: Self-pay | Admitting: Family Medicine

## 2019-10-04 ENCOUNTER — Ambulatory Visit: Payer: PRIVATE HEALTH INSURANCE | Admitting: Family Medicine

## 2019-10-09 ENCOUNTER — Ambulatory Visit: Payer: PRIVATE HEALTH INSURANCE | Admitting: Family Medicine

## 2019-10-15 ENCOUNTER — Ambulatory Visit: Payer: PRIVATE HEALTH INSURANCE | Admitting: Family Medicine

## 2019-10-17 ENCOUNTER — Ambulatory Visit: Payer: PRIVATE HEALTH INSURANCE | Admitting: Pulmonary Disease

## 2019-10-17 ENCOUNTER — Other Ambulatory Visit: Payer: Self-pay

## 2019-10-17 ENCOUNTER — Encounter: Payer: Self-pay | Admitting: Pulmonary Disease

## 2019-10-17 VITALS — BP 122/62 | HR 84 | Temp 97.2°F | Ht 71.0 in | Wt 274.2 lb

## 2019-10-17 DIAGNOSIS — R918 Other nonspecific abnormal finding of lung field: Secondary | ICD-10-CM

## 2019-10-17 DIAGNOSIS — G4733 Obstructive sleep apnea (adult) (pediatric): Secondary | ICD-10-CM

## 2019-10-17 DIAGNOSIS — R0600 Dyspnea, unspecified: Secondary | ICD-10-CM

## 2019-10-17 DIAGNOSIS — J849 Interstitial pulmonary disease, unspecified: Secondary | ICD-10-CM

## 2019-10-17 DIAGNOSIS — R0609 Other forms of dyspnea: Secondary | ICD-10-CM

## 2019-10-17 DIAGNOSIS — Z6837 Body mass index (BMI) 37.0-37.9, adult: Secondary | ICD-10-CM

## 2019-10-17 DIAGNOSIS — Z8616 Personal history of COVID-19: Secondary | ICD-10-CM

## 2019-10-17 NOTE — Progress Notes (Signed)
Synopsis: Referred in Jan 2020 for SOB by Sheliah Hatchabori, Katherine E, MD  Subjective:   PATIENT ID: Jordan Mcconnell GENDER: male DOB: 09-14-1955, MRN: [id number]  Chief Complaint  Patient presents with  . Follow-up    post covid, breathing has improved    PMH HTN. Obesity, OSA on CPAP. With exertion will get SOB. On occasion will have SOB and chest tightness. He feels like this has been going on for several months. Wife has noticed a significant change. And his endurance has definitely changed.  Overall he describes symptoms that have been progressively getting worse over the past year.  His job has somewhat changed.  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  He describes a couple of events in which he has exceeded his threshold of exercise tolerance causing significant dyspnea.  [place] was recently [place] in [place] and was swimming with his daughters and developed progressive shortness of breath with exertion.  He also describes events being outside and climbing hills.  He has had cardiac evaluation to include echocardiogram and heart catheterization which was normal.  He was told that he did not think there was anything wrong with his cardiac function because his dyspnea.  He does have sleep apnea and wears his CPAP religiously every night.  OV 12/12/2018: Patient doing well today.  He has had significant cardiac work-up.  Most of which has been negative.  He is doing well with his CPAP continues to do so.  Has been very very active the springtime and summer.  Regular bass fisherman.  Has been doing well.  Plans for some tournaments coming up soon.  Has no significant respiratory complaints today.  He does feel that hot weather and humidity will trigger his shortness of breath some.  Today we discussed the potentials for mild intermittent type reactive  airway disease versus mild intermittent asthma.  He has been using his Symbicort inhaler which she does feel as if has improved his respiratory symptoms.  He does need refills of this.  We also discussed potentially the need for increasing this if he is having ongoing symptoms right now is managed with 2 puffs twice daily of Symbicort 80.  10/17/2019: Patient here today for follow-up.  Patient was diagnosed with COVID-19 on July 06, 2019 patient had follow-up imaging on 08/28/2019 with small unchanged bilateral lung nodules however as bilateral persistent groundglass opacities consistent with his history of COVID-19 infection.  Here today for follow-up regarding abnormality seen in CT imaging.  Patient was not hospitalized for his COVID-19.  He was discharged and did receive antibody infusion on 07/11/2019 at [facility].  Patient states that the time of his Covid diagnosis he spent 3 days at home using his CPAP continuously to help him breathe.  At this point he has slowly been recovering he has noticed a significant decline in his overall functional status through the months of February and March.  However over the past couple of weeks he can see that he is slowly starting to improve to a closer baseline.  Today in the office we reviewed his CT imaging that was completed back in February with persistent infiltrates.     Past Medical History:  Diagnosis Date  . Arthritis   . Complication of anesthesia    one surgery pt.  states he was hard to wake up  . Diabetes mellitus without complication (HCC)    type 2  . GERD (gastroesophageal reflux disease)   . HTN (hypertension)   . 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 Obesity   . OSA (obstructive sleep apnea)    uses CPAP  . PONV (postoperative nausea and vomiting)      Family History  Problem Relation Age of Onset  . Memory loss Mother   . Heart murmur Mother   . Colon cancer Father   . Stroke Father   . Cancer  Father        colon  . Dementia Father   . Cancer Maternal Grandfather        lung and bone  . Memory loss Maternal Grandmother      Past Surgical History:  Procedure Laterality Date  . BACK SURGERY  08/2015  . COLONOSCOPY    . ESOPHAGEAL DILATION     1 or 2 times  . ESOPHAGOGASTRODUODENOSCOPY N/A 10/30/2013   Procedure: ESOPHAGOGASTRODUODENOSCOPY (EGD);  Surgeon: Charolett Bumpers, MD;  Location: [facility];  Service: Endoscopy;  Laterality: N/A;  . JOINT REPLACEMENT     bilateral knees  . LEFT HEART CATH AND CORONARY ANGIOGRAPHY N/A 07/13/2018   Procedure: LEFT HEART CATH AND CORONARY ANGIOGRAPHY;  Surgeon: Corky Crafts, MD;  Location: [facility] INVASIVE CV LAB;  Service: Cardiovascular;  Laterality: N/A;  . SHOULDER ARTHROSCOPY     Bilateral 3 times each shoulder  . TOTAL HIP ARTHROPLASTY Left 04/16/2019   Procedure: TOTAL HIP ARTHROPLASTY ANTERIOR APPROACH;  Surgeon: Ollen Gross, MD;  Location: [facility];  Service: Orthopedics;  Laterality: Left;     Social History   Socioeconomic History  . Marital status: Married    Spouse name: Not on file  . Number of children: Not on file  . Years of education: Not on file  . Highest education level: Not on file  Occupational History  . Not on file  Tobacco Use  . Smoking status: Never Smoker  . Smokeless tobacco: Never Used  Substance and Sexual Activity  . Alcohol use: No  . Drug use: No  . Sexual activity: Yes  Other Topics Concern  . Not on file  Social History Narrative  . Not on file   Social Determinants of Health   Financial Resource Strain:   . Difficulty of Paying Living Expenses:   Food Insecurity:   . Worried About Programme researcher, broadcasting/film/video in the Last Year:   . Barista in the Last Year:   Transportation Needs:   . Freight forwarder (Medical):   Marland Kitchen Lack of Transportation (Non-Medical):   Physical Activity:   . Days of Exercise per Week:   . Minutes of Exercise per Session:   Stress:   .  Feeling of Stress :   Social Connections:   . Frequency of Communication with Friends and Family:   . Frequency of Social Gatherings with Friends and Family:   . Attends Religious Services:   . Active Member of Clubs or Organizations:   . Attends Banker Meetings:   Marland Kitchen Marital Status:   Intimate Partner Violence:   . Fear of Current or Ex-Partner:   . Emotionally Abused:   Marland Kitchen Physically Abused:   . Sexually Abused:      Allergies  Allergen Reactions  . Penicillins Rash    DID THE REACTION INVOLVE: Swelling of the face/tongue/throat, SOB, or low  BP? No Sudden or severe rash/hives, skin peeling, or the inside of the mouth or nose? Yes Did it require medical treatment? Yes When did it last happen?childhood allergy If all above answers are "NO", may proceed with cephalosporin use.    . Sulfa Antibiotics Rash  . Codeine Nausea Only  . Tramadol Nausea Only    Severe dizziness     Outpatient Medications Prior to Visit  Medication Sig Dispense Refill  . acetaminophen (TYLENOL) 325 MG tablet Take 650 mg by mouth every 6 (six) hours as needed for fever.    Marland Kitchen albuterol (PROVENTIL HFA;VENTOLIN HFA) 108 (90 Base) MCG/ACT inhaler Inhale 2 puffs into the lungs every 4 (four) hours as needed for wheezing or shortness of breath. 1 Inhaler 6  . budesonide-formoterol (SYMBICORT) 160-4.5 MCG/ACT inhaler Inhale 2 puffs into the lungs 2 (two) times a day. 1-2 puffs twice daily 1 Inhaler 6  . hydrochlorothiazide (HYDRODIURIL) 25 MG tablet Take 1 tablet (25 mg total) by mouth daily. Please schedule annual appt with Dr. Radford Pax for refills. [phone]. 1st attempt. 30 tablet 0  . metFORMIN (GLUCOPHAGE) 500 MG tablet TAKE 1 TABLET BY MOUTH TWICE A DAY WITH FOOD 60 tablet 6  . nitroGLYCERIN (NITROSTAT) 0.4 MG SL tablet Place 1 tablet (0.4 mg total) under the tongue every 5 (five) minutes as needed for chest pain. 90 tablet 3   No facility-administered medications prior to visit.     Review of Systems  Constitutional: Negative for chills, fever, malaise/fatigue and weight loss.  HENT: Negative for hearing loss, sore throat and tinnitus.   Eyes: Negative for blurred vision and double vision.  Respiratory: Positive for shortness of breath. Negative for cough, hemoptysis, sputum production, wheezing and stridor.   Cardiovascular: Negative for chest pain, palpitations, orthopnea, leg swelling and PND.  Gastrointestinal: Negative for abdominal pain, constipation, diarrhea, heartburn, nausea and vomiting.  Genitourinary: Negative for dysuria, hematuria and urgency.  Musculoskeletal: Negative for joint pain and myalgias.  Skin: Negative for itching and rash.  Neurological: Negative for dizziness, tingling, weakness and headaches.  Endo/Heme/Allergies: Negative for environmental allergies. Does not bruise/bleed easily.  Psychiatric/Behavioral: Negative for depression. The patient is not nervous/anxious and does not have insomnia.   All other systems reviewed and are negative.    Objective:  Physical Exam Vitals reviewed.  Constitutional:      General: He is not in acute distress.    Appearance: He is well-developed. He is obese.  HENT:     Head: Normocephalic and atraumatic.  Eyes:     General: No scleral icterus.    Conjunctiva/sclera: Conjunctivae normal.     Pupils: Pupils are equal, round, and reactive to light.  Neck:     Vascular: No JVD.     Trachea: No tracheal deviation.  Cardiovascular:     Rate and Rhythm: Normal rate and regular rhythm.     Heart sounds: Normal heart sounds. No murmur.  Pulmonary:     Effort: Pulmonary effort is normal. No tachypnea, accessory muscle usage or respiratory distress.     Breath sounds: No stridor. No wheezing, rhonchi or rales.  Abdominal:     General: Bowel sounds are normal. There is no distension.     Palpations: Abdomen is soft.     Tenderness: There is no abdominal tenderness.  Musculoskeletal:         General: No tenderness.     Cervical back: Neck supple.  Lymphadenopathy:     Cervical: No cervical adenopathy.  Skin:  General: Skin is warm and dry.     Capillary Refill: Capillary refill takes less than 2 seconds.     Findings: No rash.  Neurological:     Mental Status: He is alert and oriented to person, place, and time.  Psychiatric:        Behavior: Behavior normal.      Vitals:   10/17/19 0937  BP: 122/62  Pulse: 84  Temp: (!) 97.2 F (36.2 C)  TempSrc: Temporal  SpO2: 97%  Weight: 274 lb 3.2 oz (124.4 kg)  Height: 5\' 11"  (1.803 m)   97% on RA BMI Readings from Last 3 Encounters:  10/17/19 38.24 kg/m  07/06/19 37.66 kg/m  06/22/19 38.27 kg/m   Wt Readings from Last 3 Encounters:  10/17/19 274 lb 3.2 oz (124.4 kg)  07/06/19 270 lb (122.5 kg)  06/22/19 274 lb 6 oz (124.5 kg)     CBC    Component Value Date/Time   WBC 4.7 07/06/2019 1238   RBC 5.50 07/06/2019 1238   HGB 15.6 07/06/2019 1238   HCT 47.7 07/06/2019 1238   PLT 116 (L) 07/06/2019 1238   MCV 86.7 07/06/2019 1238   MCH 28.4 07/06/2019 1238   MCHC 32.7 07/06/2019 1238   RDW 13.5 07/06/2019 1238   LYMPHSABS 1.4 07/06/2019 1238   MONOABS 0.3 07/06/2019 1238   EOSABS 0.0 07/06/2019 1238   BASOSABS 0.0 07/06/2019 1238    Chest Imaging: Chest x-ray 06/21/2018: Poor inspiratory effort, no infiltrate no acute abnormality. The patient's images have been independently reviewed by me.    High-resolution CT imaging of the chest.: Small subcentimeter nodules which will recommend for follow-up.  No evidence of ILD. The patient's images have been independently reviewed by me.    08/28/2019 CT chest: Stable bilateral pulmonary nodules, persistent groundglass opacities infiltrates within both lungs consistent with his previous diagnosis of COVID-19.  This is approximately 2 months from his initial diagnosis in May remain persistent.  When I compared to previous imaging. The patient's images have  been independently reviewed by me.    Pulmonary Functions Testing Results: PFT Results Latest Ref Rng & Units 08/04/2018  FVC-Pre L 3.32  FVC-Predicted Pre % 68  FVC-Post L 3.38  FVC-Predicted Post % 69  Pre FEV1/FVC % % 82  Post FEV1/FCV % % 87  FEV1-Pre L 2.73  FEV1-Predicted Pre % 74  FEV1-Post L 2.94  DLCO UNC% % 34  DLCO COR %Predicted % 146  TLC L 5.59  TLC % Predicted % 77  RV % Predicted % 110    Polysomnogram 11/03/2008: AHI 32.9, during REM sleep 37.9 Epworth baseline 16  FeNO: None   Pathology: None   Echocardiogram:  2010 - Normal ejection fraction  Heart Catheterization:  07/13/2018  Prox LAD lesion is 25% stenosed.  Mid LAD lesion is 25% stenosed.  The left ventricular systolic function is normal.  The left ventricular ejection fraction is 55-65% by visual estimate.  LV end diastolic pressure is mildly elevated.  There is no aortic valve stenosis.  Tortuous right subclavian makes catheter torquing difficult. If emergency cath was needed in the future, would consider different approach rather than right radial artery.       Assessment & Plan:     ICD-10-CM   1. DOE (dyspnea on exertion)  R06.00 Pulmonary Function Test  2. OSA (obstructive sleep apnea)  G47.33   3. Morbid obesity (HCC)  E66.01   4. Lung nodules  R91.8   5. BMI 37.0-37.9,  adult  Z68.37   6. History of COVID-19  Z86.16 Pulmonary Function Test  7. Ground glass opacity present on imaging of lung  R91.8   8. ILD (interstitial lung disease) (HCC)  J84.9      Assessment:   64 year old gentleman obese with dyspnea on exertion was his initial consultation.  He had significant work-up to include normal cardiac evaluation, normal heart catheterization normal pulmonary function does test except for low DLCO, HRCT completed at that time with no significant interstitial changes we discussed lifestyle modifications and exercise to help with weight loss to see if this improves his dyspnea.  At  the time also thought to have may be mild asthma symptoms and was treated with inhalers.  He also has small subcentimeter pulmonary nodule at the 11-month noncontrasted CT follow-up this was completed in February 2021 however patient was diagnosed with COVID-19 in January.  He still had persistent interstitial markings and changes within the lung parenchyma on CT imaging.  Plan Following Extensive Data Review & Interpretation:  . I reviewed prior external note(s) from 07/06/2019 emergency department visit, 07/11/2019 [facility] infusion . I reviewed the result(s) of positive COVID-19 test 07/06/2019 . I have ordered pulmonary function test  Independent interpretation of tests . Review of patient's 08/28/2019 images of CT chest revealed stable bilateral pulmonary nodules, persistent bilateral groundglass opacities consistent with previous COVID-19 infection. The patient's images have been independently reviewed by me.    Patient return to clinic in 3 months following PFTs.   Current Outpatient Medications:  .  acetaminophen (TYLENOL) 325 MG tablet, Take 650 mg by mouth every 6 (six) hours as needed for fever., Disp: , Rfl:  .  albuterol (PROVENTIL HFA;VENTOLIN HFA) 108 (90 Base) MCG/ACT inhaler, Inhale 2 puffs into the lungs every 4 (four) hours as needed for wheezing or shortness of breath., Disp: 1 Inhaler, Rfl: 6 .  budesonide-formoterol (SYMBICORT) 160-4.5 MCG/ACT inhaler, Inhale 2 puffs into the lungs 2 (two) times a day. 1-2 puffs twice daily, Disp: 1 Inhaler, Rfl: 6 .  hydrochlorothiazide (HYDRODIURIL) 25 MG tablet, Take 1 tablet (25 mg total) by mouth daily. Please schedule annual appt with Dr. Mayford Knife for refills. [phone]. 1st attempt., Disp: 30 tablet, Rfl: 0 .  metFORMIN (GLUCOPHAGE) 500 MG tablet, TAKE 1 TABLET BY MOUTH TWICE A DAY WITH FOOD, Disp: 60 tablet, Rfl: 6   Josephine Igo, DO [facility] 10/17/2019 9:51 AM

## 2019-10-17 NOTE — Patient Instructions (Signed)
Thank you for visiting Dr. Tonia Brooms at [facility]. Today we recommend the following:  Orders Placed This Encounter  Procedures  . Pulmonary Function Test   Keep up the exercise and walking PFTs prior to next office visit   Return in about 3 months (around 01/16/2020).    Please do your part to reduce the spread of COVID-19.

## 2019-10-23 ENCOUNTER — Ambulatory Visit: Payer: PRIVATE HEALTH INSURANCE | Admitting: Family Medicine

## 2019-10-23 ENCOUNTER — Encounter: Payer: Self-pay | Admitting: Family Medicine

## 2019-10-23 ENCOUNTER — Other Ambulatory Visit: Payer: Self-pay

## 2019-10-23 VITALS — BP 122/83 | HR 69 | Temp 97.9°F | Resp 16 | Ht 71.0 in | Wt 280.1 lb

## 2019-10-23 DIAGNOSIS — I10 Essential (primary) hypertension: Secondary | ICD-10-CM

## 2019-10-23 DIAGNOSIS — E119 Type 2 diabetes mellitus without complications: Secondary | ICD-10-CM | POA: Diagnosis not present

## 2019-10-23 LAB — BASIC METABOLIC PANEL
BUN: 16 mg/dL (ref 6–23)
CO2: 25 mEq/L (ref 19–32)
Calcium: 8.9 mg/dL (ref 8.4–10.5)
Chloride: 104 mEq/L (ref 96–112)
Creatinine, Ser: 0.69 mg/dL (ref 0.40–1.50)
GFR: 115.73 mL/min (ref >60.00)
Glucose, Bld: 109 mg/dL — ABNORMAL HIGH (ref 70–99)
Potassium: 4 mEq/L (ref 3.5–5.1)
Sodium: 136 mEq/L (ref 135–145)

## 2019-10-23 LAB — CBC WITH DIFFERENTIAL/PLATELET
Basophils Absolute: 0 10*3/uL (ref 0.0–0.1)
Basophils Relative: 0.6 % (ref 0.0–3.0)
Eosinophils Absolute: 0.2 10*3/uL (ref 0.0–0.7)
Eosinophils Relative: 2.6 % (ref 0.0–5.0)
HCT: 44.1 % (ref 39.0–52.0)
Hemoglobin: 14.9 g/dL (ref 13.0–17.0)
Lymphocytes Relative: 35.4 % (ref 12.0–46.0)
Lymphs Abs: 2.7 10*3/uL (ref 0.7–4.0)
MCHC: 33.7 g/dL (ref 30.0–36.0)
MCV: 85.4 fl (ref 78.0–100.0)
Monocytes Absolute: 0.5 10*3/uL (ref 0.1–1.0)
Monocytes Relative: 7.2 % (ref 3.0–12.0)
Neutro Abs: 4.1 10*3/uL (ref 1.4–7.7)
Neutrophils Relative %: 54.2 % (ref 43.0–77.0)
Platelets: 207 10*3/uL (ref 150.0–400.0)
RBC: 5.16 Mil/uL (ref 4.22–5.81)
RDW: 14.2 % (ref 11.5–15.5)
WBC: 7.6 10*3/uL (ref 4.0–10.5)

## 2019-10-23 LAB — LIPID PANEL
Cholesterol: 128 mg/dL (ref 0–200)
HDL: 29 mg/dL — ABNORMAL LOW (ref >39.00)
LDL Cholesterol: 67 mg/dL (ref 0–99)
NonHDL: 99.28
Total CHOL/HDL Ratio: 4
Triglycerides: 160 mg/dL — ABNORMAL HIGH (ref 0.0–149.0)
VLDL: 32 mg/dL (ref 0.0–40.0)

## 2019-10-23 LAB — HEPATIC FUNCTION PANEL
ALT: 49 U/L (ref 0–53)
AST: 26 U/L (ref 0–37)
Albumin: 4 g/dL (ref 3.5–5.2)
Alkaline Phosphatase: 69 U/L (ref 39–117)
Bilirubin, Direct: 0.1 mg/dL (ref 0.0–0.3)
Total Bilirubin: 0.3 mg/dL (ref 0.2–1.2)
Total Protein: 6.5 g/dL (ref 6.0–8.3)

## 2019-10-23 LAB — TSH: TSH: 2.64 u[IU]/mL (ref 0.35–4.50)

## 2019-10-23 LAB — HEMOGLOBIN A1C: Hgb A1c MFr Bld: 7.1 % — ABNORMAL HIGH (ref 4.6–6.5)

## 2019-10-23 NOTE — Patient Instructions (Signed)
Follow up in 3-4 months to recheck diabetes We'll notify you of your lab results and make any changes if needed Continue to work on healthy diet and regular exercise- you can do it!!! Call with any questions or concerns Stay Safe!  Stay Healthy!! 

## 2019-10-23 NOTE — Progress Notes (Signed)
   Subjective:    Patient ID: Jordan Mcconnell, male    DOB: 04/20/1956, 64 y.o.   MRN: [id number]  HPI HTN- chronic problem, on HCTZ 25mg  daily.  BP is adequately controlled today.  Denies CP, SOB above baseline (ongoing issue for pt), HAs, visual changes, edema.  DM- chronic problem, on Metformin 500mg  BID.  Last A1C 6.8.  UTD on microalbumin, eye exam, foot exam.  Denies symptomatic lows.  No numbness/tingling of hands/feet.  No abd pain, N/V.  Obesity- ongoing issue, pt is up 6 lbs since last visit.  No regular exercise due to ongoing SOB.   Review of Systems For ROS see HPI   This visit occurred during the SARS-CoV-2 public health emergency.  Safety protocols were in place, including screening questions prior to the visit, additional usage of staff PPE, and extensive cleaning of exam room while observing appropriate contact time as indicated for disinfecting solutions.       Objective:   Physical Exam Vitals reviewed.  Constitutional:      General: He is not in acute distress.    Appearance: He is well-developed. He is obese.  HENT:     Head: Normocephalic and atraumatic.  Eyes:     Conjunctiva/sclera: Conjunctivae normal.     Pupils: Pupils are equal, round, and reactive to light.  Neck:     Thyroid: No thyromegaly.  Cardiovascular:     Rate and Rhythm: Normal rate and regular rhythm.     Heart sounds: Normal heart sounds. No murmur.  Pulmonary:     Effort: Pulmonary effort is normal. No respiratory distress.     Comments: Decreased breath sounds throughout Abdominal:     General: Bowel sounds are normal. There is no distension.     Palpations: Abdomen is soft.  Musculoskeletal:     Cervical back: Normal range of motion and neck supple.  Lymphadenopathy:     Cervical: No cervical adenopathy.  Skin:    General: Skin is warm and dry.  Neurological:     Mental Status: He is alert and oriented to person, place, and time.     Cranial Nerves: No cranial nerve deficit.    Psychiatric:        Behavior: Behavior normal.           Assessment & Plan:

## 2019-10-23 NOTE — Assessment & Plan Note (Addendum)
Ongoing issue for pt.  Tolerating Metformin w/o difficulty.  Hx of good control.  Currently asymptomatic w/ exception of ongoing SOB.  UTD on foot exam, eye exam, microalbumin.  Check labs.  Adjust meds prn

## 2019-10-23 NOTE — Assessment & Plan Note (Addendum)
Chronic problem.  Well controlled.  Asymptomatic w/ exception of ongoing shortness of breath.  Check labs.  No anticipated med changes.

## 2019-10-23 NOTE — Assessment & Plan Note (Signed)
Deteriorated.  Pt has gained weight since last visit.  Again stressed need for healthy diet and regular exercise.  Will follow.

## 2019-10-23 NOTE — Addendum Note (Signed)
Addended by: Sheliah Hatch on: 10/23/2019 11:21 AM   Modules accepted: Orders

## 2019-10-24 ENCOUNTER — Encounter: Payer: Self-pay | Admitting: General Practice

## 2019-11-03 ENCOUNTER — Other Ambulatory Visit (HOSPITAL_COMMUNITY)
Admission: RE | Admit: 2019-11-03 | Discharge: 2019-11-03 | Disposition: A | Discharge status: Home / Self Care | Payer: Managed Care, Other (non HMO) | Source: Ambulatory Visit | Attending: Pulmonary Disease | Admitting: Pulmonary Disease

## 2019-11-03 DIAGNOSIS — Z01812 Encounter for preprocedural laboratory examination: Secondary | ICD-10-CM | POA: Diagnosis not present

## 2019-11-03 DIAGNOSIS — Z20822 Contact with and (suspected) exposure to covid-19: Secondary | ICD-10-CM | POA: Diagnosis not present

## 2019-11-03 LAB — SARS CORONAVIRUS 2 (TAT 6-24 HRS): SARS Coronavirus 2: NEGATIVE

## 2019-11-06 ENCOUNTER — Ambulatory Visit: Payer: 59 | Admitting: Pulmonary Disease

## 2019-11-06 ENCOUNTER — Other Ambulatory Visit: Payer: Self-pay

## 2019-11-06 DIAGNOSIS — R0600 Dyspnea, unspecified: Secondary | ICD-10-CM

## 2019-11-06 DIAGNOSIS — J849 Interstitial pulmonary disease, unspecified: Secondary | ICD-10-CM

## 2019-11-06 DIAGNOSIS — Z8616 Personal history of COVID-19: Secondary | ICD-10-CM

## 2019-11-06 DIAGNOSIS — R0609 Other forms of dyspnea: Secondary | ICD-10-CM

## 2019-11-06 LAB — PULMONARY FUNCTION TEST
DL/VA % pred: 102 %
DL/VA: 4.3 ml/min/mmHg/L
DLCO cor % pred: 91 %
DLCO cor: 24.03 ml/min/mmHg
DLCO unc % pred: 92 %
DLCO unc: 24.23 ml/min/mmHg
FEF 25-75 Post: 3.88 L/sec
FEF 25-75 Pre: 3.2 L/sec
FEF2575-%Change-Post: 21 %
FEF2575-%Pred-Post: 142 %
FEF2575-%Pred-Pre: 117 %
FEV1-%Change-Post: 3 %
FEV1-%Pred-Post: 103 %
FEV1-%Pred-Pre: 100 %
FEV1-Post: 3.5 L
FEV1-Pre: 3.39 L
FEV1FVC-%Change-Post: 5 %
FEV1FVC-%Pred-Pre: 106 %
FEV6-%Change-Post: -1 %
FEV6-%Pred-Post: 97 %
FEV6-%Pred-Pre: 99 %
FEV6-Post: 4.18 L
FEV6-Pre: 4.25 L
FEV6FVC-%Pred-Post: 105 %
FEV6FVC-%Pred-Pre: 105 %
FVC-%Change-Post: -1 %
FVC-%Pred-Post: 92 %
FVC-%Pred-Pre: 94 %
FVC-Post: 4.19 L
FVC-Pre: 4.26 L
Post FEV1/FVC ratio: 84 %
Post FEV6/FVC ratio: 100 %
Pre FEV1/FVC ratio: 80 %
Pre FEV6/FVC Ratio: 100 %
RV % pred: 91 %
RV: 2.06 L
TLC % pred: 114 %
TLC: 7.77 L

## 2019-11-06 NOTE — Progress Notes (Signed)
Full PFT performed today. °

## 2019-11-10 ENCOUNTER — Other Ambulatory Visit: Payer: Self-pay | Admitting: Cardiology

## 2019-11-26 ENCOUNTER — Telehealth: Payer: Self-pay | Admitting: Pulmonary Disease

## 2019-11-26 NOTE — Telephone Encounter (Signed)
Left a voicemail for patient to call our office back regarding prior message for PFT result's

## 2019-11-26 NOTE — Telephone Encounter (Signed)
Patient is returning phone call. Patient phone number is [phone].

## 2019-11-26 NOTE — Telephone Encounter (Signed)
Dr. Tonia Brooms please advise on PFT results.

## 2019-11-27 NOTE — Telephone Encounter (Signed)
Called patient's wife and informed her of normal PFTs

## 2020-01-05 ENCOUNTER — Other Ambulatory Visit: Payer: Self-pay | Admitting: Cardiology

## 2020-01-28 ENCOUNTER — Ambulatory Visit: Payer: PRIVATE HEALTH INSURANCE | Admitting: Family Medicine

## 2020-02-06 ENCOUNTER — Other Ambulatory Visit: Payer: Self-pay

## 2020-02-06 ENCOUNTER — Ambulatory Visit: Payer: Managed Care, Other (non HMO) | Admitting: Family Medicine

## 2020-02-06 ENCOUNTER — Encounter: Payer: Self-pay | Admitting: Family Medicine

## 2020-02-06 VITALS — BP 124/82 | HR 76 | Temp 98.4°F | Resp 16 | Ht 71.0 in | Wt 268.4 lb

## 2020-02-06 DIAGNOSIS — E119 Type 2 diabetes mellitus without complications: Secondary | ICD-10-CM

## 2020-02-06 DIAGNOSIS — I10 Essential (primary) hypertension: Secondary | ICD-10-CM | POA: Diagnosis not present

## 2020-02-06 MED ORDER — HYDROCHLOROTHIAZIDE 25 MG PO TABS: 25.0000 mg | ORAL_TABLET | Freq: Every day | ORAL | 1 refills | Status: DC

## 2020-02-06 NOTE — Patient Instructions (Signed)
Schedule your complete physical in 3-4 months We'll notify you of your lab results and make any changes if needed Continue to work on healthy diet and regular exercise- you're doing great! Call with any questions or concerns Stay Safe!  Stay Healthy!

## 2020-02-06 NOTE — Assessment & Plan Note (Signed)
Pt is down 12 lbs since last visit.  Applauded his efforts.

## 2020-02-06 NOTE — Assessment & Plan Note (Signed)
Chronic problem.  Tolerating Metformin w/o difficulty.  UTD on eye exam, microalbumin.  Foot exam done today.  Check labs.  Adjust meds prn  ?

## 2020-02-06 NOTE — Progress Notes (Signed)
   Subjective:    Patient ID: Jordan Mcconnell, male    DOB: 1956-02-02, 65 y.o.   MRN: [id number]  HPI DM- chronic problem, on Metformin 500mg  BID.  UTD on microalbumin, eye exam.  Due for foot exam.  Pt is down 12 lbs since last visit.  Job is much more physically demanding at this point.  Denies CP, SOB above baseline since COVID, HAs, visual changes, edema.  Denies symptomatic lows.  No numbness/tingling of hands/feet.   HTN- chronic problem, on HCTZ 25mg  daily w/ good control.  Morbid Obesity- pt is down 12 lbs since last visit.  He reports this is due to a change in job responsibilities- now walking 18-28,000 steps daily.  States he is trying to follow a low carb diet.   Review of Systems For ROS see HPI   This visit occurred during the SARS-CoV-2 public health emergency.  Safety protocols were in place, including screening questions prior to the visit, additional usage of staff PPE, and extensive cleaning of exam room while observing appropriate contact time as indicated for disinfecting solutions.       Objective:   Physical Exam Vitals reviewed.  Constitutional:      General: He is not in acute distress.    Appearance: He is well-developed. He is obese.  HENT:     Head: Normocephalic and atraumatic.  Eyes:     Conjunctiva/sclera: Conjunctivae normal.     Pupils: Pupils are equal, round, and reactive to light.  Neck:     Thyroid: No thyromegaly.  Cardiovascular:     Rate and Rhythm: Normal rate and regular rhythm.     Heart sounds: Normal heart sounds. No murmur heard.   Pulmonary:     Effort: Pulmonary effort is normal. No respiratory distress.     Breath sounds: Normal breath sounds.  Abdominal:     General: Bowel sounds are normal. There is no distension.     Palpations: Abdomen is soft.  Musculoskeletal:     Cervical back: Normal range of motion and neck supple.  Lymphadenopathy:     Cervical: No cervical adenopathy.  Skin:    General: Skin is warm and dry.    Neurological:     Mental Status: He is alert and oriented to person, place, and time.     Cranial Nerves: No cranial nerve deficit.  Psychiatric:        Behavior: Behavior normal.           Assessment & Plan:

## 2020-02-06 NOTE — Assessment & Plan Note (Signed)
Chronic problem.  Well controlled.  Currently asymptomatic.  Will assume prescribing responsibility at pt's request.  Will follow.

## 2020-02-07 ENCOUNTER — Encounter: Payer: Self-pay | Admitting: General Practice

## 2020-02-07 LAB — HEMOGLOBIN A1C: Hgb A1c MFr Bld: 7 % — ABNORMAL HIGH (ref 4.6–6.5)

## 2020-02-07 LAB — BASIC METABOLIC PANEL
BUN: 17 mg/dL (ref 6–23)
CO2: 27 mEq/L (ref 19–32)
Calcium: 9.5 mg/dL (ref 8.4–10.5)
Chloride: 101 mEq/L (ref 96–112)
Creatinine, Ser: 0.73 mg/dL (ref 0.40–1.50)
GFR: 108.34 mL/min (ref >60.00)
Glucose, Bld: 90 mg/dL (ref 70–99)
Potassium: 4.1 mEq/L (ref 3.5–5.1)
Sodium: 135 mEq/L (ref 135–145)

## 2020-04-21 IMAGING — RF DG HIP (WITH PELVIS) OPERATIVE*L*
1 series · 7 of 7 positions shown · non-contrast
Comparison: None.

CLINICAL DATA: Left hip replacement

FLUOROSCOPY TIME:  9 seconds
EXAM:
OPERATIVE LEFT HIP (WITH PELVIS IF PERFORMED) 5 VIEWS
TECHNIQUE: Fluoroscopic spot image(s) were submitted for interpretation
post-operatively.

[Series 1: unknown protocol · 0.20mm/px · 7 of 7 slices shown]
[im 1/7]
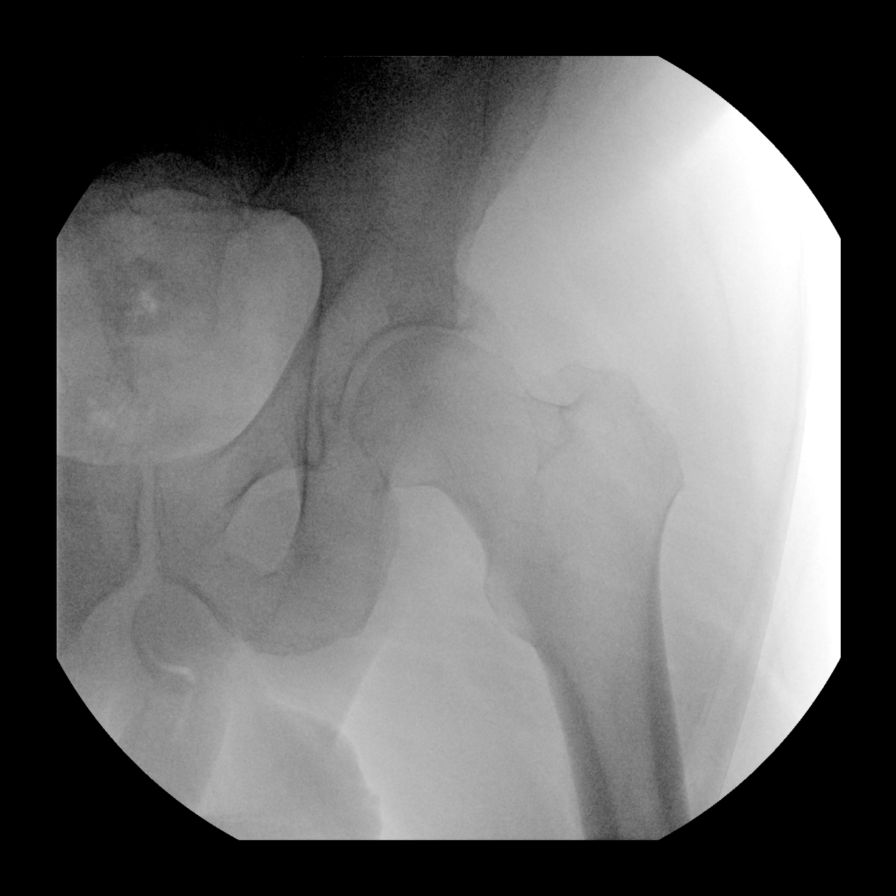
[im 2/7]
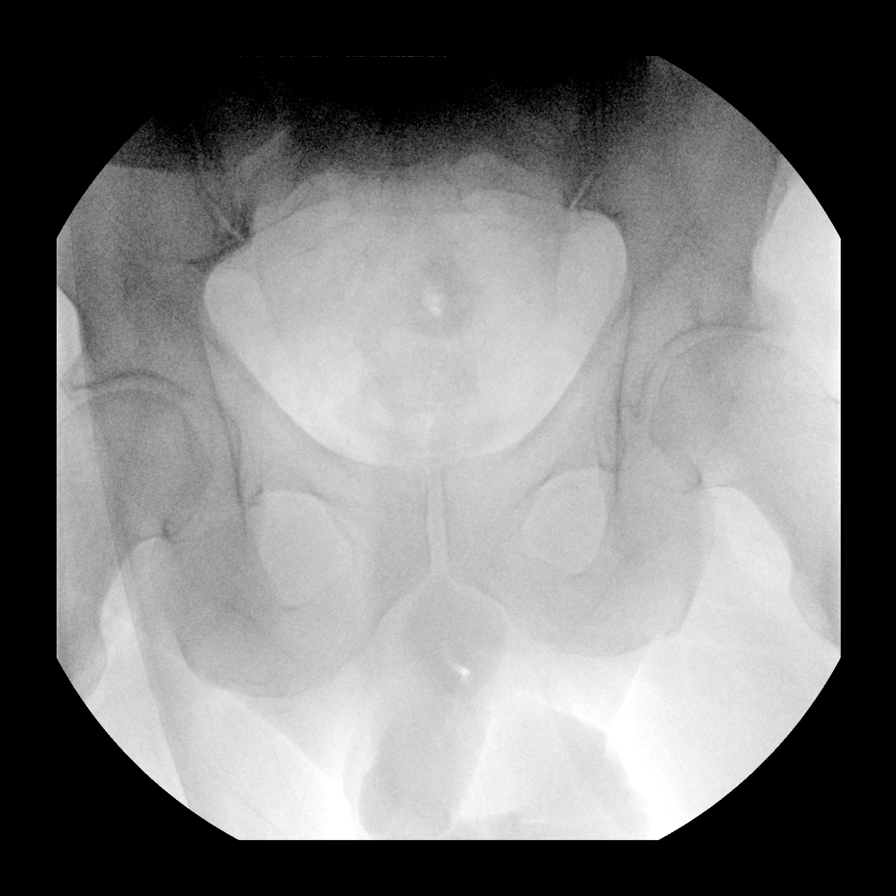
[im 3/7]
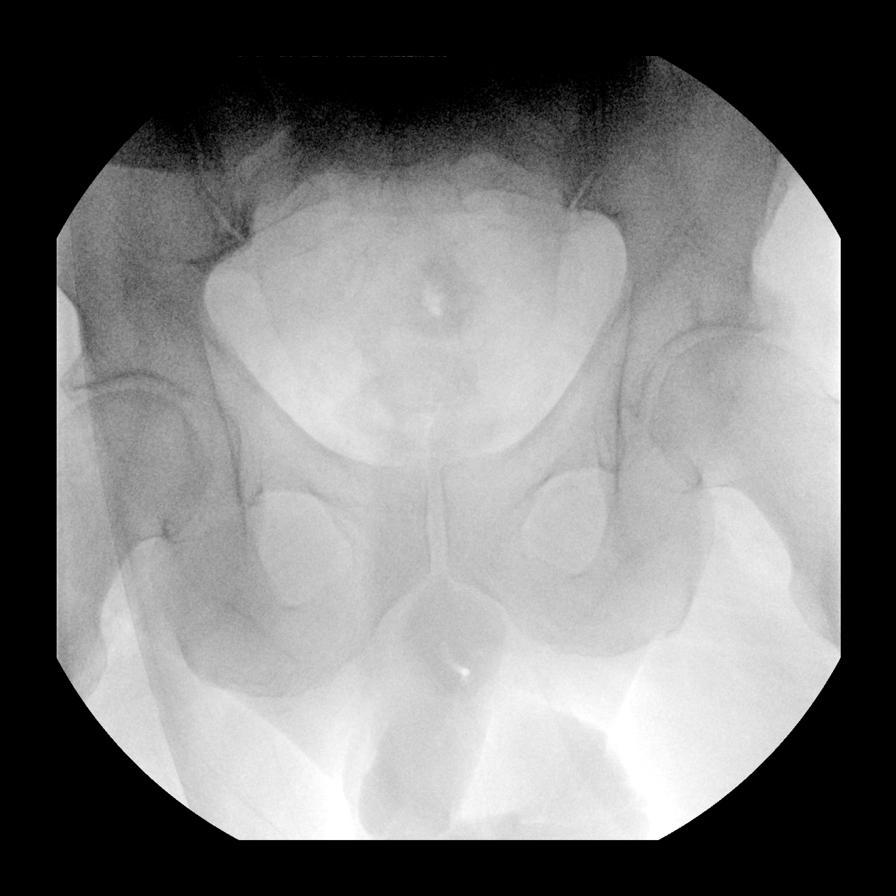
[im 4/7]
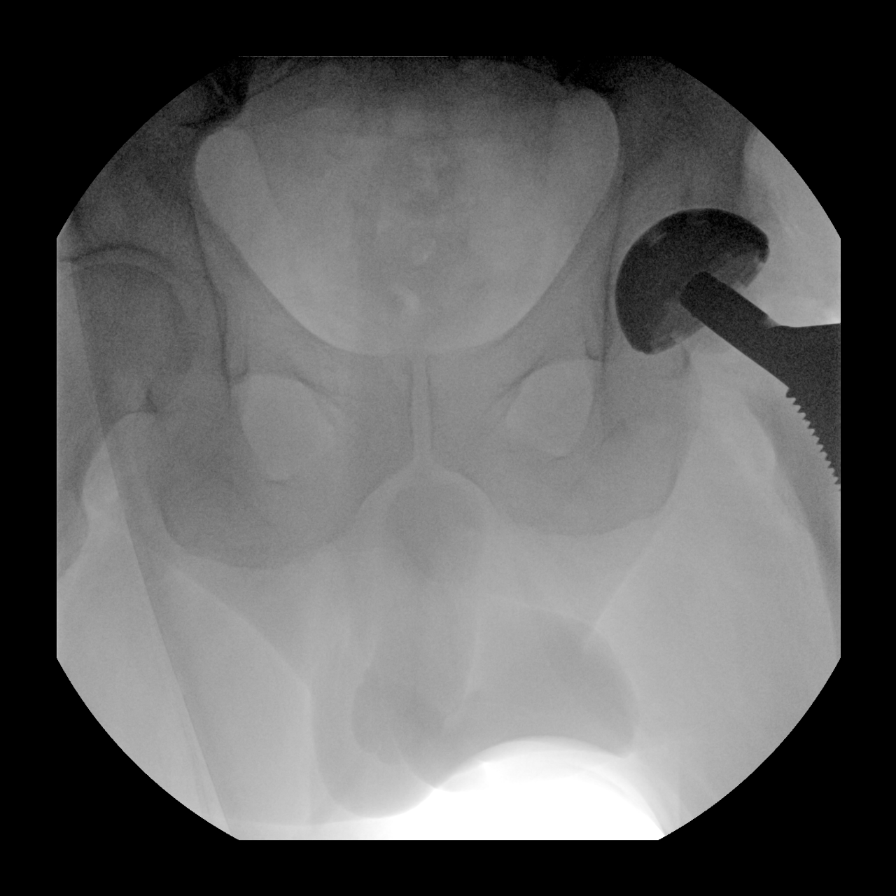
[im 5/7]
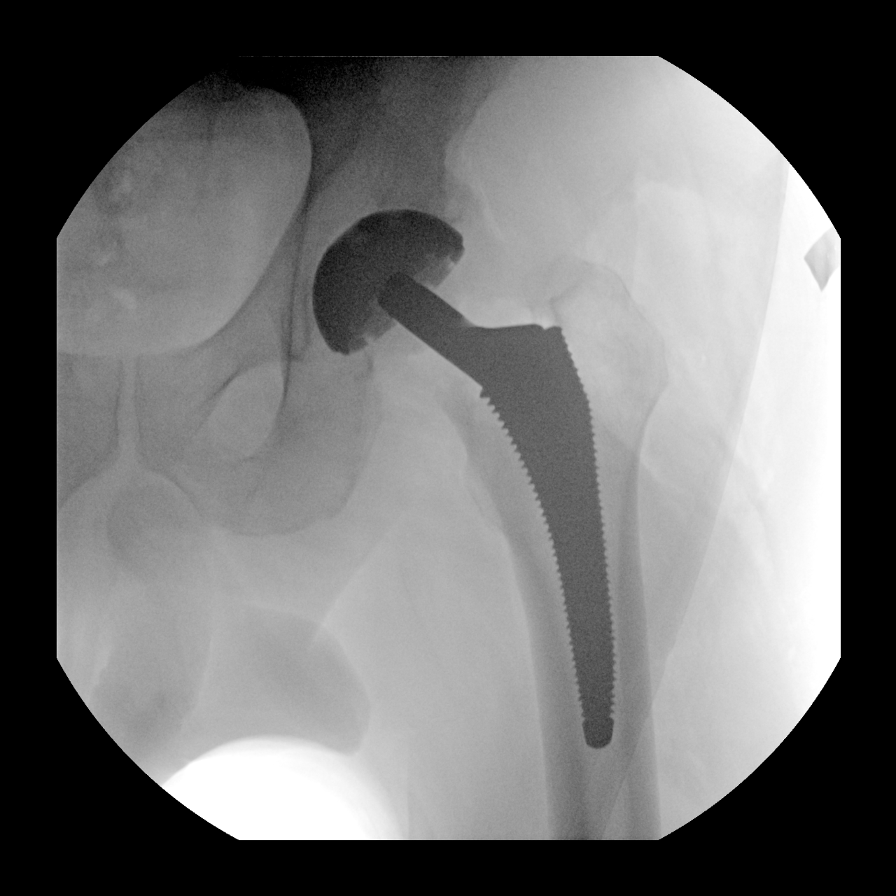
[im 6/7]
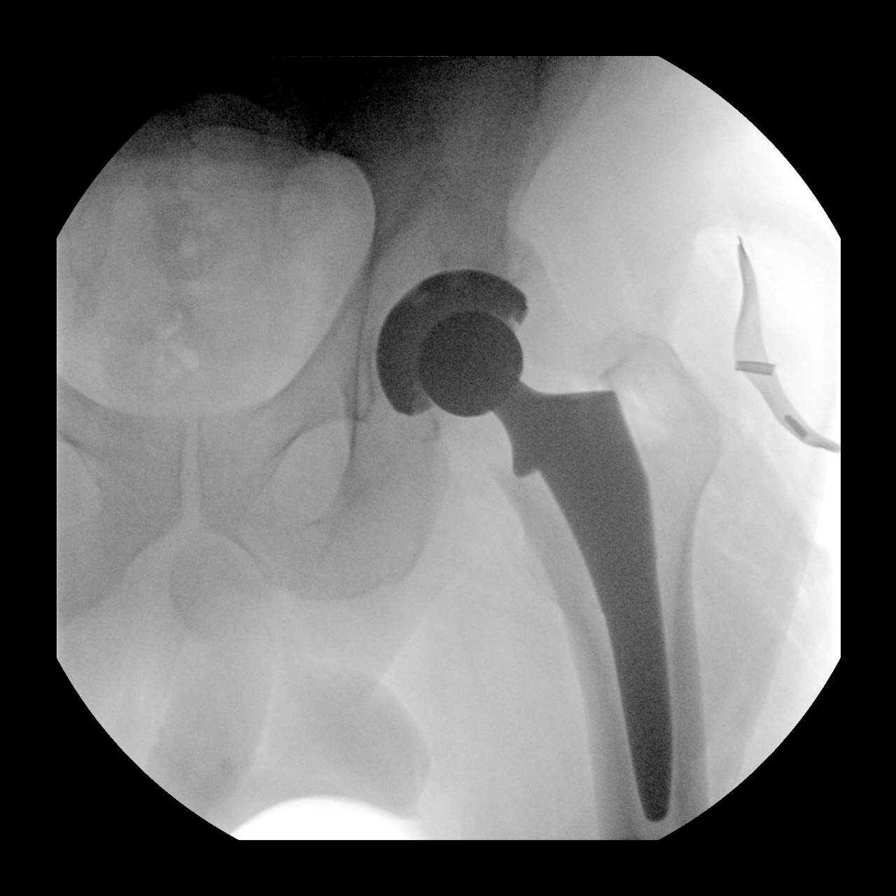
[im 7/7]
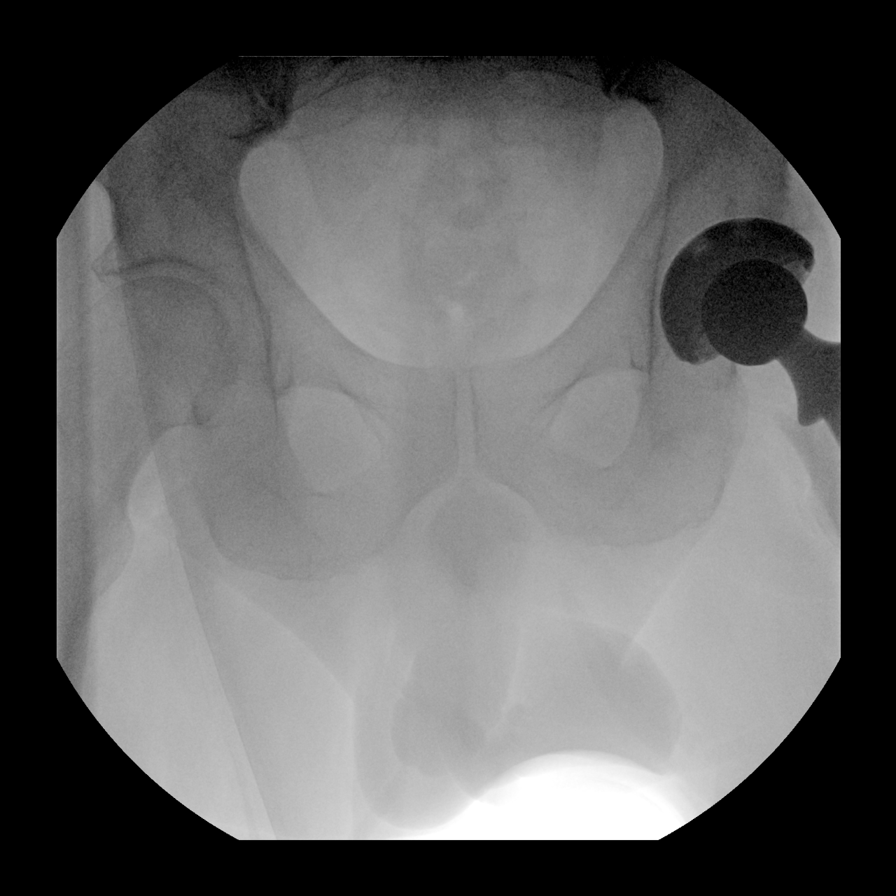

[7 of 7 positions shown; findings below may reference images not displayed]

FINDINGS: The patient is status post left hip replacement by the end of the
study. Hardware is in good position.
IMPRESSION: Left hip replacement as above.

## 2020-04-29 ENCOUNTER — Other Ambulatory Visit: Payer: Self-pay | Admitting: Family Medicine

## 2020-05-15 ENCOUNTER — Other Ambulatory Visit: Payer: Self-pay

## 2020-05-15 ENCOUNTER — Ambulatory Visit (INDEPENDENT_AMBULATORY_CARE_PROVIDER_SITE_OTHER): Payer: Managed Care, Other (non HMO) | Admitting: Family Medicine

## 2020-05-15 ENCOUNTER — Encounter: Payer: Self-pay | Admitting: Family Medicine

## 2020-05-15 VITALS — BP 132/78 | HR 74 | Temp 98.1°F | Resp 20 | Ht 68.0 in | Wt 262.0 lb

## 2020-05-15 DIAGNOSIS — Z125 Encounter for screening for malignant neoplasm of prostate: Secondary | ICD-10-CM | POA: Diagnosis not present

## 2020-05-15 DIAGNOSIS — Z1159 Encounter for screening for other viral diseases: Secondary | ICD-10-CM | POA: Diagnosis not present

## 2020-05-15 DIAGNOSIS — E119 Type 2 diabetes mellitus without complications: Secondary | ICD-10-CM

## 2020-05-15 DIAGNOSIS — Z114 Encounter for screening for human immunodeficiency virus [HIV]: Secondary | ICD-10-CM

## 2020-05-15 DIAGNOSIS — Z0000 Encounter for general adult medical examination without abnormal findings: Secondary | ICD-10-CM | POA: Diagnosis not present

## 2020-05-15 LAB — CBC WITH DIFFERENTIAL/PLATELET
Basophils Absolute: 0.1 10*3/uL (ref 0.0–0.1)
Basophils Relative: 0.9 % (ref 0.0–3.0)
Eosinophils Absolute: 0.2 10*3/uL (ref 0.0–0.7)
Eosinophils Relative: 2.9 % (ref 0.0–5.0)
HCT: 48.9 % (ref 39.0–52.0)
Hemoglobin: 16.4 g/dL (ref 13.0–17.0)
Lymphocytes Relative: 36.9 % (ref 12.0–46.0)
Lymphs Abs: 2.7 10*3/uL (ref 0.7–4.0)
MCHC: 33.5 g/dL (ref 30.0–36.0)
MCV: 86.5 fl (ref 78.0–100.0)
Monocytes Absolute: 0.4 10*3/uL (ref 0.1–1.0)
Monocytes Relative: 5.5 % (ref 3.0–12.0)
Neutro Abs: 3.9 10*3/uL (ref 1.4–7.7)
Neutrophils Relative %: 53.8 % (ref 43.0–77.0)
Platelets: 210 10*3/uL (ref 150.0–400.0)
RBC: 5.65 Mil/uL (ref 4.22–5.81)
RDW: 14 % (ref 11.5–15.5)
WBC: 7.3 10*3/uL (ref 4.0–10.5)

## 2020-05-15 LAB — LIPID PANEL
Cholesterol: 138 mg/dL (ref 0–200)
HDL: 35.6 mg/dL — ABNORMAL LOW (ref >39.00)
LDL Cholesterol: 65 mg/dL (ref 0–99)
NonHDL: 102.86
Total CHOL/HDL Ratio: 4
Triglycerides: 189 mg/dL — ABNORMAL HIGH (ref 0.0–149.0)
VLDL: 37.8 mg/dL (ref 0.0–40.0)

## 2020-05-15 LAB — MICROALBUMIN / CREATININE URINE RATIO
Creatinine,U: 95.1 mg/dL
Microalb Creat Ratio: 1 mg/g (ref 0.0–30.0)
Microalb, Ur: 0.9 mg/dL (ref 0.0–1.9)

## 2020-05-15 LAB — HEPATIC FUNCTION PANEL
ALT: 59 U/L — ABNORMAL HIGH (ref 0–53)
AST: 28 U/L (ref 0–37)
Albumin: 4.5 g/dL (ref 3.5–5.2)
Alkaline Phosphatase: 82 U/L (ref 39–117)
Bilirubin, Direct: 0.1 mg/dL (ref 0.0–0.3)
Total Bilirubin: 0.7 mg/dL (ref 0.2–1.2)
Total Protein: 7.3 g/dL (ref 6.0–8.3)

## 2020-05-15 LAB — HEMOGLOBIN A1C: Hgb A1c MFr Bld: 7 % — ABNORMAL HIGH (ref 4.6–6.5)

## 2020-05-15 LAB — BASIC METABOLIC PANEL
BUN: 21 mg/dL (ref 6–23)
CO2: 26 mEq/L (ref 19–32)
Calcium: 9.7 mg/dL (ref 8.4–10.5)
Chloride: 98 mEq/L (ref 96–112)
Creatinine, Ser: 0.75 mg/dL (ref 0.40–1.50)
GFR: 95.86 mL/min (ref >60.00)
Glucose, Bld: 150 mg/dL — ABNORMAL HIGH (ref 70–99)
Potassium: 3.7 mEq/L (ref 3.5–5.1)
Sodium: 134 mEq/L — ABNORMAL LOW (ref 135–145)

## 2020-05-15 LAB — TSH: TSH: 1.47 u[IU]/mL (ref 0.35–4.50)

## 2020-05-15 LAB — PSA: PSA: 1.13 ng/mL (ref 0.10–4.00)

## 2020-05-15 NOTE — Progress Notes (Signed)
   Subjective:    Patient ID: Jordan Mcconnell, male    DOB: [birth year], 64 y.o.   MRN: [id number]  HPI CPE- UTD on colonoscopy, eye exam, foot exam.  UTD on Tdap.  UTD on COVID vaccines.  Reviewed past medical, surgical, family and social histories.   Patient Care Team    Relationship Specialty Notifications Start End  Sheliah Hatch, MD PCP - General Family Medicine  07/07/16   Quintella Reichert, MD PCP - Cardiology Cardiology Admissions 07/11/18   Quintella Reichert, MD Consulting Physician Cardiology  02/16/18      Health Maintenance  Topic Date Due  . Hepatitis C Screening  Never done  . HIV Screening  Never done  . COVID-19 Vaccine (2 - Pfizer 2-dose series) 10/30/2019  . URINE MICROALBUMIN  06/21/2020  . PNEUMOCOCCAL POLYSACCHARIDE VACCINE AGE 51-64 HIGH RISK  10/22/2020 (Originally 08/03/1957)  . HEMOGLOBIN A1C  08/08/2020  . FOOT EXAM  02/05/2021  . OPHTHALMOLOGY EXAM  02/11/2021  . COLONOSCOPY  02/26/2026  . TETANUS/TDAP  07/07/2026  . INFLUENZA VACCINE  Completed      Review of Systems Patient reports no vision/hearing changes, anorexia, fever ,adenopathy, persistant/recurrent hoarseness, chest pain, palpitations, edema, persistant/recurrent cough, hemoptysis, gastrointestinal  bleeding (melena, rectal bleeding), abdominal pain, excessive heart burn, GU symptoms (dysuria, hematuria, voiding/incontinence issues) syncope, focal weakness, memory loss, numbness & tingling, skin/hair/nail changes, abnormal bruising/bleeding, musculoskeletal symptoms/signs.   + dysphagia- not interested in GI + SOB w/ exertion- following w/ Pulm + stress reaction- wife w/ metastatic cancer (just recently dx'd) and prognosis is not good  This visit occurred during the SARS-CoV-2 public health emergency.  Safety protocols were in place, including screening questions prior to the visit, additional usage of staff PPE, and extensive cleaning of exam room while observing appropriate contact time as  indicated for disinfecting solutions.       Objective:   Physical Exam General Appearance:    Alert, cooperative, no distress, appears stated age  Head:    Normocephalic, without obvious abnormality, atraumatic  Eyes:    PERRL, conjunctiva/corneas clear, EOM's intact, fundi    benign, both eyes       Ears:    Normal TM's and external ear canals, both ears  Nose:   Deferred due to COVID  Throat:   Neck:   Supple, symmetrical, trachea midline, no adenopathy;       thyroid:  No enlargement/tenderness/nodules  Back:     Symmetric, no curvature, ROM normal, no CVA tenderness  Lungs:     Clear to auscultation bilaterally, respirations unlabored  Chest wall:    No tenderness or deformity  Heart:    Regular rate and rhythm, S1 and S2 normal, no murmur, rub   or gallop  Abdomen:     Soft, non-tender, bowel sounds active all four quadrants,    no masses, no organomegaly  Genitalia:    deferred  Rectal:    Extremities:   Extremities normal, atraumatic, no cyanosis or edema  Pulses:   2+ and symmetric all extremities  Skin:   Skin color, texture, turgor normal, no rashes or lesions  Lymph nodes:   Cervical, supraclavicular, and axillary nodes normal  Neurologic:   CNII-XII intact. Normal strength, sensation and reflexes      throughout          Assessment & Plan:

## 2020-05-15 NOTE — Assessment & Plan Note (Signed)
Pt's PE WNL w/ exception of obesity.  UTD on colonoscopy, immunizations.  Check labs.  Anticipatory guidance provided.  

## 2020-05-15 NOTE — Patient Instructions (Signed)
Follow up in 3-4 months to recheck diabetes We'll notify you of your lab results and make any changes if needed Continue to work on healthy diet and regular exercise- you're down 6 lbs Make sure you are taking care of yourself!!! Call with any questions or concerns Hang in there!!

## 2020-05-15 NOTE — Assessment & Plan Note (Signed)
Chronic problem.  UTD on foot exam, eye exam.  Will get microalbumin today.  Check labs.  Adjust meds prn

## 2020-05-16 LAB — HEPATITIS C ANTIBODY
Hepatitis C Ab: NONREACTIVE
SIGNAL TO CUT-OFF: 0.01 (ref <1.00)

## 2020-05-16 LAB — HIV ANTIBODY (ROUTINE TESTING W REFLEX): HIV 1&2 Ab, 4th Generation: NONREACTIVE

## 2020-05-17 ENCOUNTER — Ambulatory Visit: Payer: Managed Care, Other (non HMO) | Attending: Internal Medicine

## 2020-05-17 DIAGNOSIS — Z23 Encounter for immunization: Secondary | ICD-10-CM

## 2020-05-17 NOTE — Progress Notes (Signed)
   Covid-19 Vaccination Clinic  Name:  Jordan Mcconnell    MRN: [id number] DOB: 1955-12-03  05/17/2020  [name] was observed post Covid-19 immunization for 15 minutes without incident. He was provided with Vaccine Information Sheet and instruction to access the V-Safe system.   [name] was instructed to call 911 with any severe reactions post vaccine: Marland Kitchen Difficulty breathing  . Swelling of face and throat  . A fast heartbeat  . A bad rash all over body  . Dizziness and weakness   Immunizations Administered    Name Date Dose VIS Date Route   Pfizer COVID-19 Vaccine 05/17/2020  4:38 PM 0.3 mL 04/23/2020 Intramuscular   Manufacturer: [employer], [employer]   Lot: [id number]   NDC: [address]-3

## 2020-06-25 ENCOUNTER — Other Ambulatory Visit: Payer: Self-pay | Admitting: *Deleted

## 2020-06-25 MED ORDER — BUDESONIDE-FORMOTEROL FUMARATE 160-4.5 MCG/ACT IN AERO
2.0000 | INHALATION_SPRAY | Freq: Two times a day (BID) | RESPIRATORY_TRACT | 2 refills | Status: DC
Start: 2020-06-25 — End: 2020-09-05

## 2020-08-09 ENCOUNTER — Other Ambulatory Visit: Payer: Self-pay | Admitting: Family Medicine

## 2020-09-05 ENCOUNTER — Other Ambulatory Visit: Payer: Self-pay

## 2020-09-05 ENCOUNTER — Ambulatory Visit: Payer: Managed Care, Other (non HMO) | Admitting: Family Medicine

## 2020-09-05 ENCOUNTER — Encounter: Payer: Self-pay | Admitting: Family Medicine

## 2020-09-05 VITALS — BP 124/80 | HR 79 | Temp 98.3°F | Resp 18 | Ht 68.0 in | Wt 269.2 lb

## 2020-09-05 DIAGNOSIS — E119 Type 2 diabetes mellitus without complications: Secondary | ICD-10-CM

## 2020-09-05 DIAGNOSIS — Z636 Dependent relative needing care at home: Secondary | ICD-10-CM | POA: Diagnosis not present

## 2020-09-05 NOTE — Progress Notes (Signed)
   Subjective:    Patient ID: Jordan Mcconnell, male    DOB: 02-May-1956, 65 y.o.   MRN: [id number]  HPI DM- chronic problem, UTD on eye exam, foot exam, and microalbumin.  Hx of good control on Metformin 500mg  BID.  Pt has gained 7 lbs since last visit.  Wife has metastatic cancer and is not doing well.  Pt is working 50-60 hrs/week.  Often eating on the run.  'tired all the time'.  Denies CP.  No SOB above baseline.  Denies HA, visual changes, abd pain, N/V.  No symptomatic lows.  Denies numbness/tingling of hands/feet.  Morbid obesity- pt has gained 7 lbs and BMI is now 40.93  Not able to exercise between work and wife's appts.  Often eating on the run.  Caregiver stress- wife was told she has 1-3 yrs even with treatment.  She has completed numerous radiation treatments and chemo.  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   Review of Systems For ROS see HPI   This visit occurred during the SARS-CoV-2 public health emergency.  Safety protocols were in place, including screening questions prior to the visit, additional usage of staff PPE, and extensive cleaning of exam room while observing appropriate contact time as indicated for disinfecting solutions.       Objective:   Physical Exam Vitals reviewed.  Constitutional:      General: He is not in acute distress.    Appearance: Normal appearance. He is well-developed and well-nourished. He is obese.  HENT:     Head: Normocephalic and atraumatic.  Eyes:     Extraocular Movements: EOM normal.     Conjunctiva/sclera: Conjunctivae normal.     Pupils: Pupils are equal, round, and reactive to light.  Neck:     Thyroid: No thyromegaly.  Cardiovascular:     Rate and Rhythm: Normal rate and regular rhythm.     Pulses: Normal pulses and intact distal pulses.     Heart sounds: Normal heart sounds. No murmur heard.   Pulmonary:     Effort: Pulmonary effort is normal. No respiratory distress.     Breath sounds:  Normal breath sounds.  Abdominal:     General: Bowel sounds are normal. There is no distension.     Palpations: Abdomen is soft.  Musculoskeletal:        General: No edema.     Cervical back: Normal range of motion and neck supple.     Right lower leg: No edema.     Left lower leg: No edema.  Lymphadenopathy:     Cervical: No cervical adenopathy.  Skin:    General: Skin is warm and dry.  Neurological:     Mental Status: He is alert and oriented to person, place, and time.     Cranial Nerves: No cranial nerve deficit.  Psychiatric:        Mood and Affect: Mood and affect normal.        Behavior: Behavior normal.           Assessment & Plan:

## 2020-09-05 NOTE — Patient Instructions (Signed)
Follow up in 3-4 months to recheck diabetes, BP We'll notify you of your lab results and make any changes if needed Try and make healthy food choices and get regular physical activity Call with any questions or concerns- or if you need anything! Hang in there!!!

## 2020-09-05 NOTE — Assessment & Plan Note (Signed)
Chronic problem.  Tolerating Metformin w/o difficulty.  UTD on foot exam, eye exam, microalbumin.  Check labs.  Adjust meds prn

## 2020-09-05 NOTE — Assessment & Plan Note (Signed)
Ongoing issue since wife was dx'd w/ advanced metastatic cancer.  They were told even with treatment she likely has only 1-3 yrs.  Pt has a strong faith and is leaning on that during these difficult times.  He is not interested in a support group or counseling or medication at this time.  Will continue to follow and offer my support.

## 2020-09-05 NOTE — Assessment & Plan Note (Signed)
Deteriorated.  Pt has gained 7 lbs and BMI is now 40.93.  He is not able to exercise due to work and wife's appts an he is frequently eating on the run.  Stressed the need for him to take care of himself so that he can help her.  Will continue to follow.

## 2020-09-06 LAB — BASIC METABOLIC PANEL
BUN: 18 mg/dL (ref 7–25)
CO2: 25 mmol/L (ref 20–32)
Calcium: 9.3 mg/dL (ref 8.6–10.3)
Chloride: 102 mmol/L (ref 98–110)
Creat: 0.83 mg/dL (ref 0.70–1.25)
Glucose, Bld: 102 mg/dL — ABNORMAL HIGH (ref 65–99)
Potassium: 3.8 mmol/L (ref 3.5–5.3)
Sodium: 140 mmol/L (ref 135–146)

## 2020-09-06 LAB — HEMOGLOBIN A1C
Hgb A1c MFr Bld: 7.8 % of total Hgb — ABNORMAL HIGH (ref <5.7)
Mean Plasma Glucose: 177 mg/dL
eAG (mmol/L): 9.8 mmol/L

## 2020-09-09 ENCOUNTER — Other Ambulatory Visit: Payer: Self-pay

## 2020-09-09 DIAGNOSIS — E119 Type 2 diabetes mellitus without complications: Secondary | ICD-10-CM

## 2020-09-09 MED ORDER — METFORMIN HCL 1000 MG PO TABS: 1000.0000 mg | ORAL_TABLET | Freq: Two times a day (BID) | ORAL | 3 refills | Status: DC

## 2020-12-24 ENCOUNTER — Encounter: Payer: Self-pay | Admitting: Family Medicine

## 2020-12-24 ENCOUNTER — Other Ambulatory Visit: Payer: Self-pay

## 2020-12-24 ENCOUNTER — Ambulatory Visit: Payer: Managed Care, Other (non HMO) | Admitting: Family Medicine

## 2020-12-24 VITALS — BP 130/76 | HR 89 | Temp 97.4°F | Resp 18 | Ht 68.0 in | Wt 263.2 lb

## 2020-12-24 DIAGNOSIS — I10 Essential (primary) hypertension: Secondary | ICD-10-CM | POA: Diagnosis not present

## 2020-12-24 DIAGNOSIS — F4323 Adjustment disorder with mixed anxiety and depressed mood: Secondary | ICD-10-CM | POA: Diagnosis not present

## 2020-12-24 DIAGNOSIS — E119 Type 2 diabetes mellitus without complications: Secondary | ICD-10-CM

## 2020-12-24 LAB — LIPID PANEL
Cholesterol: 148 mg/dL (ref 0–200)
HDL: 35.4 mg/dL — ABNORMAL LOW (ref >39.00)
NonHDL: 112.13
Total CHOL/HDL Ratio: 4
Triglycerides: 203 mg/dL — ABNORMAL HIGH (ref 0.0–149.0)
VLDL: 40.6 mg/dL — ABNORMAL HIGH (ref 0.0–40.0)

## 2020-12-24 LAB — BASIC METABOLIC PANEL
BUN: 18 mg/dL (ref 6–23)
CO2: 26 mEq/L (ref 19–32)
Calcium: 9.5 mg/dL (ref 8.4–10.5)
Chloride: 99 mEq/L (ref 96–112)
Creatinine, Ser: 0.85 mg/dL (ref 0.40–1.50)
GFR: 91.91 mL/min (ref >60.00)
Glucose, Bld: 191 mg/dL — ABNORMAL HIGH (ref 70–99)
Potassium: 3.9 mEq/L (ref 3.5–5.1)
Sodium: 136 mEq/L (ref 135–145)

## 2020-12-24 LAB — LDL CHOLESTEROL, DIRECT: Direct LDL: 97 mg/dL

## 2020-12-24 LAB — CBC WITH DIFFERENTIAL/PLATELET
Basophils Absolute: 0 10*3/uL (ref 0.0–0.1)
Basophils Relative: 0.6 % (ref 0.0–3.0)
Eosinophils Absolute: 0.5 10*3/uL (ref 0.0–0.7)
Eosinophils Relative: 6.3 % — ABNORMAL HIGH (ref 0.0–5.0)
HCT: 44.6 % (ref 39.0–52.0)
Hemoglobin: 15.1 g/dL (ref 13.0–17.0)
Lymphocytes Relative: 35.9 % (ref 12.0–46.0)
Lymphs Abs: 2.8 10*3/uL (ref 0.7–4.0)
MCHC: 34 g/dL (ref 30.0–36.0)
MCV: 86.3 fl (ref 78.0–100.0)
Monocytes Absolute: 0.4 10*3/uL (ref 0.1–1.0)
Monocytes Relative: 5.3 % (ref 3.0–12.0)
Neutro Abs: 4 10*3/uL (ref 1.4–7.7)
Neutrophils Relative %: 51.9 % (ref 43.0–77.0)
Platelets: 196 10*3/uL (ref 150.0–400.0)
RBC: 5.17 Mil/uL (ref 4.22–5.81)
RDW: 13.8 % (ref 11.5–15.5)
WBC: 7.8 10*3/uL (ref 4.0–10.5)

## 2020-12-24 LAB — TSH: TSH: 0.96 u[IU]/mL (ref 0.35–4.50)

## 2020-12-24 LAB — HEPATIC FUNCTION PANEL
ALT: 49 U/L (ref 0–53)
AST: 23 U/L (ref 0–37)
Albumin: 4.4 g/dL (ref 3.5–5.2)
Alkaline Phosphatase: 82 U/L (ref 39–117)
Bilirubin, Direct: 0.1 mg/dL (ref 0.0–0.3)
Total Bilirubin: 0.4 mg/dL (ref 0.2–1.2)
Total Protein: 7.1 g/dL (ref 6.0–8.3)

## 2020-12-24 LAB — HEMOGLOBIN A1C: Hgb A1c MFr Bld: 6.8 % — ABNORMAL HIGH (ref 4.6–6.5)

## 2020-12-24 NOTE — Progress Notes (Signed)
   Subjective:    Patient ID: Jordan Mcconnell, male    DOB: 05-14-1956, 65 y.o.   MRN: [id number]  HPI HTN- chronic problem, on HCTZ 25mg  daily w/ good control.  Denies CP, SOB, HAs, visual changes, edema.  DM- chronic problem, on Metformin 1000mg  BID.  UTD on eye exam, foot exam, microalbumin.  Denies symptomatic lows, numbness/tingling of hands/feet.  No abd pain, N/V.  Obesity- pt is down 6 lbs since last visit.  BMI 40.02.  16000-28000 steps/day  Adjustment disorder- wife just recently passed.  Pt is not interested in grief counseling and definitely not interested in medication.  Has gone back to work which is a good distraction.  Pt is not sleeping.   Review of Systems For ROS see HPI   This visit occurred during the SARS-CoV-2 public health emergency.  Safety protocols were in place, including screening questions prior to the visit, additional usage of staff PPE, and extensive cleaning of exam room while observing appropriate contact time as indicated for disinfecting solutions.      Objective:   Physical Exam Vitals reviewed.  Constitutional:      General: He is not in acute distress.    Appearance: He is well-developed. He is obese.  HENT:     Head: Normocephalic and atraumatic.  Eyes:     Extraocular Movements: Extraocular movements intact.     Conjunctiva/sclera: Conjunctivae normal.     Pupils: Pupils are equal, round, and reactive to light.  Neck:     Thyroid: No thyromegaly.  Cardiovascular:     Rate and Rhythm: Normal rate and regular rhythm.     Pulses: Normal pulses.     Heart sounds: Normal heart sounds. No murmur heard. Pulmonary:     Effort: Pulmonary effort is normal. No respiratory distress.     Breath sounds: Normal breath sounds.  Abdominal:     General: Bowel sounds are normal. There is no distension.     Palpations: Abdomen is soft.  Musculoskeletal:     Cervical back: Normal range of motion and neck supple.     Right lower leg: No edema.      Left lower leg: No edema.  Lymphadenopathy:     Cervical: No cervical adenopathy.  Skin:    General: Skin is warm and dry.  Neurological:     General: No focal deficit present.     Mental Status: He is alert and oriented to person, place, and time.     Cranial Nerves: No cranial nerve deficit.  Psychiatric:        Mood and Affect: Mood normal.        Behavior: Behavior normal.          Assessment & Plan:

## 2020-12-24 NOTE — Patient Instructions (Signed)
Follow up in 3-4 months to recheck sugar We'll notify you of your lab results and make any changes if needed Continue to work on healthy food choices and get your regular exercise Call and schedule your eye exam for August Call with any questions or concerns You are in my thoughts and prayers!

## 2020-12-24 NOTE — Assessment & Plan Note (Signed)
Chronic problem.  UTD on eye exam, foot exam, and microalbumin.  Tolerating Metformin w/o difficulty.  Check labs.  Adjust meds prn

## 2020-12-24 NOTE — Assessment & Plan Note (Signed)
Chronic problem.  Well controlled today.  Currently asymptomatic.  Check labs.  No anticipated med changes.  Will follow. 

## 2020-12-24 NOTE — Assessment & Plan Note (Signed)
Wife passed away 2 weeks ago.  They had been together since 9th grade.  She was on Hospice at the end and he and his daughters were able to be there.  He is not interested in counseling or medication.  [place]  Will continue to follow as he goes through the grieving process.

## 2020-12-24 NOTE — Assessment & Plan Note (Signed)
BMI is 40.  Again discussed low carb diet and regular exercise.  Will continue to follow.

## 2020-12-31 ENCOUNTER — Encounter: Payer: Self-pay | Admitting: *Deleted

## 2021-02-04 ENCOUNTER — Other Ambulatory Visit: Payer: Self-pay | Admitting: Family Medicine

## 2021-02-27 ENCOUNTER — Telehealth: Payer: Self-pay | Admitting: Family Medicine

## 2021-02-27 NOTE — Telephone Encounter (Signed)
Patient would like you to call him when you come back in September - he said it would not take but a few minutes on the phone - Patient did not say what this is concerning but just wanted you to know that he hopes your son feels better and that he will talk to you when you come back

## 2021-04-01 ENCOUNTER — Other Ambulatory Visit: Payer: Self-pay

## 2021-04-01 ENCOUNTER — Encounter: Payer: Self-pay | Admitting: Family Medicine

## 2021-04-01 ENCOUNTER — Ambulatory Visit: Payer: Managed Care, Other (non HMO) | Admitting: Family Medicine

## 2021-04-01 VITALS — BP 120/78 | HR 74 | Temp 98.1°F | Resp 16 | Ht 68.0 in | Wt 262.8 lb

## 2021-04-01 DIAGNOSIS — N529 Male erectile dysfunction, unspecified: Secondary | ICD-10-CM | POA: Insufficient documentation

## 2021-04-01 DIAGNOSIS — E119 Type 2 diabetes mellitus without complications: Secondary | ICD-10-CM | POA: Diagnosis not present

## 2021-04-01 DIAGNOSIS — F4321 Adjustment disorder with depressed mood: Secondary | ICD-10-CM | POA: Diagnosis not present

## 2021-04-01 LAB — BASIC METABOLIC PANEL
BUN: 16 mg/dL (ref 6–23)
CO2: 25 mEq/L (ref 19–32)
Calcium: 9.4 mg/dL (ref 8.4–10.5)
Chloride: 101 mEq/L (ref 96–112)
Creatinine, Ser: 0.77 mg/dL (ref 0.40–1.50)
GFR: 94.51 mL/min (ref >60.00)
Glucose, Bld: 95 mg/dL (ref 70–99)
Potassium: 3.9 mEq/L (ref 3.5–5.1)
Sodium: 137 mEq/L (ref 135–145)

## 2021-04-01 LAB — HEMOGLOBIN A1C: Hgb A1c MFr Bld: 7.2 % — ABNORMAL HIGH (ref 4.6–6.5)

## 2021-04-01 MED ORDER — SILDENAFIL CITRATE 100 MG PO TABS: 50.0000 mg | ORAL_TABLET | Freq: Every day | ORAL | 11 refills | Status: DC | PRN

## 2021-04-01 NOTE — Progress Notes (Signed)
   Subjective:    Patient ID: Jordan Mcconnell, male    DOB: 12/01/1955, 65 y.o.   MRN: [id number]  HPI DM- chronic problem, on Metformin 1000mg  BID.  UTD on foot exam.  UTD on eye exam (per report at [facility]).  Due for microalbumin.  Weight is stable.  No CP, SOB above baseline, HAs, visual changes, abd pain, N/V, edema.  Denies numbness/tingling of hands/feet.  No symptomatic lows.  Depression- pt is overwhelmed right now b/c both daughters have basically cut him off.  They did this when he began seeing a woman from church who is 12 yrs younger than he is shortly after wife passed.  He feels he is managing his grief well, but is struggling w/ his daughters' behavior.  Fearful he won't be able to see his current grandchildren and the one that is arriving next month.  States new woman has been very supportive and he feels he would not get through this without her.  ED- pt is asking to restart Viagra   Review of Systems For ROS see HPI   This visit occurred during the SARS-CoV-2 public health emergency.  Safety protocols were in place, including screening questions prior to the visit, additional usage of staff PPE, and extensive cleaning of exam room while observing appropriate contact time as indicated for disinfecting solutions.      Objective:   Physical Exam Vitals reviewed.  Constitutional:      General: He is not in acute distress.    Appearance: Normal appearance. He is well-developed. He is obese.  HENT:     Head: Normocephalic and atraumatic.  Eyes:     Extraocular Movements: Extraocular movements intact.     Conjunctiva/sclera: Conjunctivae normal.     Pupils: Pupils are equal, round, and reactive to light.  Neck:     Thyroid: No thyromegaly.  Cardiovascular:     Rate and Rhythm: Normal rate and regular rhythm.     Pulses: Normal pulses.     Heart sounds: Normal heart sounds. No murmur heard. Pulmonary:     Effort: Pulmonary effort is normal. No respiratory distress.      Breath sounds: Normal breath sounds.  Abdominal:     General: Bowel sounds are normal. There is no distension.     Palpations: Abdomen is soft.  Musculoskeletal:     Cervical back: Normal range of motion and neck supple.     Right lower leg: No edema.     Left lower leg: No edema.  Lymphadenopathy:     Cervical: No cervical adenopathy.  Skin:    General: Skin is warm and dry.  Neurological:     General: No focal deficit present.     Mental Status: He is alert and oriented to person, place, and time.     Cranial Nerves: No cranial nerve deficit.  Psychiatric:        Mood and Affect: Mood normal.        Behavior: Behavior normal.          Assessment & Plan:

## 2021-04-01 NOTE — Patient Instructions (Signed)
Schedule your complete physical in 3-4 months We'll notify you of your lab results and make any changes if needed Continue to work on healthy diet and regular exercise- you can do it! Let me know when you get your flu shot and we'll update the chart If the Viagra is too expensive let me know and we'll switch it If you change your mind about medication for the mood, let me know Call with any questions or concerns Stay safe!  Stay healthy! Happy Fall!!!

## 2021-04-02 LAB — MICROALBUMIN / CREATININE URINE RATIO
Creatinine,U: 161.8 mg/dL
Microalb Creat Ratio: 0.7 mg/g (ref 0.0–30.0)
Microalb, Ur: 1.2 mg/dL (ref 0.0–1.9)

## 2021-04-03 NOTE — Assessment & Plan Note (Signed)
Chronic problem.  Doing well on Metformin.  UTD on foot exan and eye exam.  Will get microalbumin today.  Check labs.  Adjust meds prn

## 2021-04-03 NOTE — Assessment & Plan Note (Signed)
Pt is dealing w/ the loss of his wife and his grief as well as can be expected.  Right now the more difficult issue is the relationship with his daughters.  They are not happy that he is seeing someone new and have been very vocal and hurtful in what they say.  Pt is not interested in medication at this time.  Feels his new relationship is his saving grace right now and medication is not needed.  I am hopeful his daughters will reconnect once their anger and hurt has subsided.  Will follow.

## 2021-04-03 NOTE — Assessment & Plan Note (Signed)
Pt has hx of similar.  Was previously on Viagra w/o difficulty and would like to restart in case his new relationship takes this turn.  Prescription provided.

## 2021-06-01 ENCOUNTER — Telehealth: Payer: Self-pay | Admitting: Family Medicine

## 2021-06-01 NOTE — Telephone Encounter (Signed)
Pt called in stating that he has a cough, congestion and lower back pain. He states that he has had a fever that comes in goes. He thinks it could be bronchitis. He was offered an appt at [facility] since we don't have any open appts today. He declined to schedule. He wanted to know if anything can be called in for him.  Please advise

## 2021-06-02 NOTE — Progress Notes (Signed)
New Patient Note  RE: Jordan Mcconnell MRN: [id number] DOB: 09-07-1955 Date of Office Visit: 06/03/2021  Consult requested by: Sheliah Hatch, MD Primary care provider: Sheliah Hatch, MD  Chief Complaint: Breathing Problem (Pt states he been having trouble breathing issues, he would get very winded, not being able to get a full breathe, and he often would get lightheaded, coughing, stuffy nose and runny nose with green mucus and occasional chest tightness, wheezing. All symptoms are worsen with movement and seasonal changes.) and Nasal Congestion  History of Present Illness: I had the pleasure of seeing Jordan Mcconnell for initial evaluation at the [facility] on 06/03/2021. He is a 65 y.o. male, who is self-referred here for the evaluation of breathing issues.  Breathing:  He reports symptoms of chest tightness, shortness of breath, coughing, wheezing for 5-10 years. Current medications include albuterol prn which help. He reports not using aerochamber with inhalers. He tried the following inhalers: Dulera in the past but did not use it regularly. Main triggers are exertion. In the last month, frequency of symptoms: daily. Frequency of nocturnal symptoms: 0x/month. Frequency of SABA use: only started using albuterol recently. Interference with physical activity: yes. Sleep is undisturbed. In the last 12 months, emergency room visits/urgent care visits/doctor office visits or hospitalizations due to respiratory issues: one. In the last 12 months, oral steroids courses: one. Lifetime history of hospitalization for respiratory issues:no. Prior intubations: no. History of pneumonia: no. He was evaluated by pulmonologist in the past - patient did not follow up as recommended. Smoking exposure: denies. Up to date with flu vaccine: yes. Up to date with pneumonia vaccine: no. Up to date with COVID-19 vaccine: yes. Prior Covid-19 infection: 2 years ago. History of reflux: yes  but does not take medications for this. Patient states he had his throat "stretched". Patient has CPAP for OSA.  Currently taking azithromycin and prednisone for an infection. He had negative covid-19 and flu testing recently.   07/06/2019 CXR: "IMPRESSION: No acute abnormality noted."  08/28/2019 CT chest: "1. New bilateral peripheral ground-glass lung opacities, nonspecific but consistent with the history of recent COVID-19 infection. 2. Unchanged small bilateral lung nodules. 3. Hepatic steatosis."  Rhinitis: He reports symptoms of nasal congestion, rhinorrhea, coughing. Symptoms have been going on for many years. The symptoms are present mainly in the fall months. Anosmia: diminished sense of smell. Headache: yes. He has used zyrtec, Flonase with some improvement in symptoms. Sinus infections: one. Previous work up includes: no. Previous ENT evaluation: not recently, no sinus surgery. Previous sinus imaging: no. History of nasal polyps: no.  10/17/2019 pulmonology visit: "65 year old gentleman obese with dyspnea on exertion was his initial consultation.  He had significant work-up to include normal cardiac evaluation, normal heart catheterization normal pulmonary function does test except for low DLCO, HRCT completed at that time with no significant interstitial changes we discussed lifestyle modifications and exercise to help with weight loss to see if this improves his dyspnea.  At the time also thought to have may be mild asthma symptoms and was treated with inhalers.   He also has small subcentimeter pulmonary nodule at the 52-month noncontrasted CT follow-up this was completed in February 2021 however patient was diagnosed with COVID-19 in January.  He still had persistent interstitial markings and changes within the lung parenchyma on CT imaging.   Plan Following Extensive Data Review & Interpretation:   I reviewed prior external note(s) from 07/06/2019 emergency department visit,  07/11/2019 [facility] infusion  I reviewed the result(s) of positive COVID-19 test 07/06/2019  I have ordered pulmonary function test   Independent interpretation of tests  Review of patient's 08/28/2019 images of CT chest revealed stable bilateral pulmonary nodules, persistent bilateral groundglass opacities consistent with previous COVID-19 infection. The patient's images have been independently reviewed by me.  "  Assessment and Plan: Jordan Mcconnell is a 65 y.o. male with: Dyspnea Dyspnea with coughing and wheezing for 10 years.  Lately has been having daily symptoms and recently started using albuterol with good benefit.  Patient had COVID-19 and was followed by pulmonology in the past.  2021 CT chest showed groundglass opacities and small unchanged bilateral lung nodules. Wears CPAP and has reflux. Today's spirometry showed no improvement in FEV1 post bronchodilator treatment.  Clinically feeling unchanged. Given clinical symptoms will do a trial of daily controller inhaler.  Daily controller medication(s): START Symbicort 2 puffs twice a day with spacer and rinse mouth afterwards. Spacer given and demonstrated proper use with inhaler. Patient understood technique and all questions/concerned were addressed.  May use albuterol rescue inhaler 2 puffs every 4 to 6 hours as needed for shortness of breath, chest tightness, coughing, and wheezing. May use albuterol rescue inhaler 2 puffs 5 to 15 minutes prior to strenuous physical activities. Monitor frequency of use.   Upper respiratory tract infection Currently has an upper respiratory infection and is taking azithromycin and prednisone.  Negative COVID-19 and flu testing. Finish antibiotics and prednisone as prescribed by the urgent care. See below for symptomatic control. May use mucinex twice a day with plenty of water to loosen up the mucous.   Chronic rhinitis Rhinitis symptoms mainly in the fall.  Using Zyrtec and Flonase some benefit.  No  prior allergy/ENT evaluation. Use Nasacort (triamcinolone) nasal spray 1 spray per nostril twice a day as needed for nasal congestion. Sample given. Nasal saline spray (i.e., Simply Saline) or nasal saline lavage (i.e., NeilMed) is recommended as needed and prior to medicated nasal sprays. Use over the counter antihistamines such as Zyrtec (cetirizine), Claritin (loratadine), Allegra (fexofenadine), or Xyzal (levocetirizine) daily as needed. May take twice a day during allergy flares. May switch antihistamines every few months. Plan on allergy skin testing at next visit when you are feeling better.  No antihistamines for 3 days before.  Heartburn Does not take any daily meds for this. Will give handout on lifestyle and dietary modifications at next visit.   Return in about 4 weeks (around 07/01/2021) for Skin testing.  Meds ordered this encounter  Medications   budesonide-formoterol (SYMBICORT) 80-4.5 MCG/ACT inhaler    Sig: Inhale 2 puffs into the lungs in the morning and at bedtime. with spacer and rinse mouth afterwards.    Dispense:  1 each    Refill:  3    Lab Orders  No laboratory test(s) ordered today    Other allergy screening: Food allergy: no Medication allergy: yes Amoxicillin - caused some itchy skin recently?  Hymenoptera allergy: no Urticaria: no Eczema:no History of recurrent infections suggestive of immunodeficency: no  Diagnostics: Spirometry:  Tracings reviewed. His effort: It was hard to get consistent efforts and there is a question as to whether this reflects a maximal maneuver. FVC: 3.59L FEV1: 2.58L, 76% predicted FEV1/FVC ratio: 76% Interpretation: No overt abnormalities noted given today's efforts with no improvement in FEV1 post bronchodilator treatment. Clinically feeling unchanged.   Please see scanned spirometry results for details.  Past Medical History: Patient Active Problem List  Diagnosis Date Noted   Multiple drug allergies 06/03/2021    Chronic rhinitis 06/03/2021   Heartburn 06/03/2021   Upper respiratory tract infection 06/03/2021   Erectile dysfunction 04/01/2021   OA (osteoarthritis) of hip 04/16/2019   Dysphagia 09/18/2018   Lung nodules 09/18/2018   Chest pain 07/11/2018   Dyspnea 06/21/2018   Physical exam 07/22/2017   Adjustment disorder 03/17/2017   Diabetes mellitus, type II (HCC) 07/29/2016   Chronic cough 07/07/2016   Irritable bowel syndrome with diarrhea 05/12/2016   Gastroesophageal reflux disease 05/12/2016   Back pain 08/06/2015   OSA (obstructive sleep apnea)    HTN (hypertension)    Morbid obesity (HCC)    Past Medical History:  Diagnosis Date   Arthritis    Complication of anesthesia    one surgery pt. states he was hard to wake up   Diabetes mellitus without complication (HCC)    type 2   GERD (gastroesophageal reflux disease)    HTN (hypertension)    Irritable bowel syndrome with diarrhea 05/12/2016   Obesity    OSA (obstructive sleep apnea)    uses CPAP   PONV (postoperative nausea and vomiting)    Past Surgical History: Past Surgical History:  Procedure Laterality Date   BACK SURGERY  08/2015   COLONOSCOPY     ESOPHAGEAL DILATION     1 or 2 times   ESOPHAGOGASTRODUODENOSCOPY N/A 10/30/2013   Procedure: ESOPHAGOGASTRODUODENOSCOPY (EGD);  Surgeon: Charolett Bumpers, MD;  Location: [facility];  Service: Endoscopy;  Laterality: N/A;   JOINT REPLACEMENT     bilateral knees   LEFT HEART CATH AND CORONARY ANGIOGRAPHY N/A 07/13/2018   Procedure: LEFT HEART CATH AND CORONARY ANGIOGRAPHY;  Surgeon: Corky Crafts, MD;  Location: [facility] INVASIVE CV LAB;  Service: Cardiovascular;  Laterality: N/A;   SHOULDER ARTHROSCOPY     Bilateral 3 times each shoulder   TOTAL HIP ARTHROPLASTY Left 04/16/2019   Procedure: TOTAL HIP ARTHROPLASTY ANTERIOR APPROACH;  Surgeon: Ollen Gross, MD;  Location: [facility];  Service: Orthopedics;  Laterality: Left;    Medication List:  Current  Outpatient Medications  Medication Sig Dispense Refill   albuterol (VENTOLIN HFA) 108 (90 Base) MCG/ACT inhaler Inhale into the lungs.     azithromycin (ZITHROMAX) 250 MG tablet Take 250 mg by mouth as directed.     budesonide-formoterol (SYMBICORT) 80-4.5 MCG/ACT inhaler Inhale 2 puffs into the lungs in the morning and at bedtime. with spacer and rinse mouth afterwards. 1 each 3   clindamycin (CLEOCIN) 150 MG capsule Take 150 mg by mouth 4 (four) times daily.     hydrochlorothiazide (HYDRODIURIL) 25 MG tablet TAKE 1 TABLET BY MOUTH EVERY DAY 90 tablet 1   metFORMIN (GLUCOPHAGE) 1000 MG tablet Take 1 tablet (1,000 mg total) by mouth 2 (two) times daily with a meal. 180 tablet 3   predniSONE (STERAPRED UNI-PAK 21 TAB) 10 MG (21) TBPK tablet Take by mouth.     sildenafil (VIAGRA) 100 MG tablet Take 0.5-1 tablets (50-100 mg total) by mouth daily as needed for erectile dysfunction. 10 tablet 11   No current facility-administered medications for this visit.   Allergies: Allergies  Allergen Reactions   Penicillins Rash    DID THE REACTION INVOLVE: Swelling of the face/tongue/throat, SOB, or low BP? No Sudden or severe rash/hives, skin peeling, or the inside of the mouth or nose? Yes Did it require medical treatment? Yes When did it last happen?      childhood allergy If all  above answers are "NO", may proceed with cephalosporin use.     Sulfa Antibiotics Rash   Codeine Nausea Only   Tramadol Nausea Only    Severe dizziness   Social History: Social History   Socioeconomic History   Marital status: Married    Spouse name: Not on file   Number of children: Not on file   Years of education: Not on file   Highest education level: Not on file  Occupational History   Not on file  Tobacco Use   Smoking status: Never   Smokeless tobacco: Never  Vaping Use   Vaping Use: Never used  Substance and Sexual Activity   Alcohol use: No   Drug use: No   Sexual activity: Yes  Other Topics  Concern   Not on file  Social History Narrative   Not on file   Social Determinants of Health   Financial Resource Strain: Not on file  Food Insecurity: Not on file  Transportation Needs: Not on file  Physical Activity: Not on file  Stress: Not on file  Social Connections: Not on file   Lives in a 64 year old house. Smoking: denies Occupation: Adult nurse HistorySurveyor, minerals in the house: no Carpet in the family room: no Carpet in the bedroom: no Heating: heat pump Cooling: heat pump Pet: yes 1 dog x 5 yrs and 1 cat x 8-9 yrs  Family History: Family History  Problem Relation Age of Onset   Memory loss Mother    Heart murmur Mother    Colon cancer Father    Stroke Father    Cancer Father        colon   Dementia Father    Asthma Maternal Grandmother    Memory loss Maternal Grandmother    Cancer Maternal Grandfather        lung and bone   Asthma Paternal Grandmother    Review of Systems  Constitutional:  Positive for fever. Negative for appetite change, chills and unexpected weight change.  HENT:  Positive for congestion, postnasal drip and rhinorrhea.   Eyes:  Negative for itching.  Respiratory:  Positive for cough, chest tightness, shortness of breath and wheezing.   Cardiovascular:  Negative for chest pain.  Gastrointestinal:  Negative for abdominal pain.  Genitourinary:  Negative for difficulty urinating.  Skin:  Negative for rash.  Neurological:  Positive for headaches.   Objective: BP 138/82   Pulse 71   Temp 98 F (36.7 C) (Temporal)   Resp 20   Ht  (1.778 m)   Wt 262 lb 12.8 oz (119.2 kg)   SpO2 98%   BMI 37.71 kg/m  Body mass index is 37.71 kg/m. Physical Exam Vitals and nursing note reviewed.  Constitutional:      Appearance: Normal appearance. He is well-developed.  HENT:     Head: Normocephalic and atraumatic.     Right Ear: Tympanic membrane and external ear normal.     Left Ear: Tympanic membrane and  external ear normal.     Nose: Nose normal.     Mouth/Throat:     Mouth: Mucous membranes are moist.     Pharynx: Oropharynx is clear.  Eyes:     Conjunctiva/sclera: Conjunctivae normal.  Cardiovascular:     Rate and Rhythm: Normal rate and regular rhythm.     Heart sounds: Normal heart sounds. No murmur heard.   No friction rub. No gallop.  Pulmonary:     Effort: Pulmonary effort  is normal.     Breath sounds: Normal breath sounds. No wheezing, rhonchi or rales.  Musculoskeletal:     Cervical back: Neck supple.  Skin:    General: Skin is warm.     Findings: No rash.  Neurological:     Mental Status: He is alert and oriented to person, place, and time.  Psychiatric:        Behavior: Behavior normal.  The plan was reviewed with the patient/family, and all questions/concerned were addressed.  It was my pleasure to see Jordan Mcconnell today and participate in his care. Please feel free to contact me with any questions or concerns.  Sincerely,  Wyline Mood, DO Allergy & Immunology  [facility] office: [phone] [facility] office: [phone]

## 2021-06-03 ENCOUNTER — Ambulatory Visit: Payer: Managed Care, Other (non HMO) | Admitting: Allergy

## 2021-06-03 ENCOUNTER — Encounter: Payer: Self-pay | Admitting: Allergy

## 2021-06-03 ENCOUNTER — Other Ambulatory Visit: Payer: Self-pay

## 2021-06-03 VITALS — BP 138/82 | HR 71 | Temp 98.0°F | Resp 20 | Ht 70.0 in | Wt 262.8 lb

## 2021-06-03 DIAGNOSIS — Z889 Allergy status to unspecified drugs, medicaments and biological substances status: Secondary | ICD-10-CM | POA: Diagnosis not present

## 2021-06-03 DIAGNOSIS — J069 Acute upper respiratory infection, unspecified: Secondary | ICD-10-CM | POA: Insufficient documentation

## 2021-06-03 DIAGNOSIS — J310 Chronic rhinitis: Secondary | ICD-10-CM | POA: Insufficient documentation

## 2021-06-03 DIAGNOSIS — R12 Heartburn: Secondary | ICD-10-CM | POA: Diagnosis not present

## 2021-06-03 DIAGNOSIS — R0602 Shortness of breath: Secondary | ICD-10-CM

## 2021-06-03 MED ORDER — BUDESONIDE-FORMOTEROL FUMARATE 80-4.5 MCG/ACT IN AERO: 2.0000 | INHALATION_SPRAY | Freq: Two times a day (BID) | RESPIRATORY_TRACT | 3 refills | Status: DC

## 2021-06-03 NOTE — Assessment & Plan Note (Signed)
Rhinitis symptoms mainly in the fall.  Using Zyrtec and Flonase some benefit.  No prior allergy/ENT evaluation. . Use Nasacort (triamcinolone) nasal spray 1 spray per nostril twice a day as needed for nasal congestion. Sample given. . Nasal saline spray (i.e., Simply Saline) or nasal saline lavage (i.e., NeilMed) is recommended as needed and prior to medicated nasal sprays. . Use over the counter antihistamines such as Zyrtec (cetirizine), Claritin (loratadine), Allegra (fexofenadine), or Xyzal (levocetirizine) daily as needed. May take twice a day during allergy flares. May switch antihistamines every few months. . Plan on allergy skin testing at next visit when you are feeling better.  o No antihistamines for 3 days before.

## 2021-06-03 NOTE — Assessment & Plan Note (Signed)
Currently has an upper respiratory infection and is taking azithromycin and prednisone.  Negative COVID-19 and flu testing. Jordan Mcconnell antibiotics and prednisone as prescribed by the urgent care. . See below for symptomatic control. . May use mucinex twice a day with plenty of water to loosen up the mucous.

## 2021-06-03 NOTE — Assessment & Plan Note (Signed)
Dyspnea with coughing and wheezing for 10 years.  Lately has been having daily symptoms and recently started using albuterol with good benefit.  Patient had COVID-19 and was followed by pulmonology in the past.  2021 CT chest showed groundglass opacities and small unchanged bilateral lung nodules. Wears CPAP and has reflux.  Today's spirometry showed no improvement in FEV1 post bronchodilator treatment.  Clinically feeling unchanged.  Given clinical symptoms will do a trial of daily controller inhaler.  . Daily controller medication(s): START Symbicort 2 puffs twice a day with spacer and rinse mouth afterwards. Marland Kitchen Spacer given and demonstrated proper use with inhaler. Patient understood technique and all questions/concerned were addressed.  . May use albuterol rescue inhaler 2 puffs every 4 to 6 hours as needed for shortness of breath, chest tightness, coughing, and wheezing. May use albuterol rescue inhaler 2 puffs 5 to 15 minutes prior to strenuous physical activities. Monitor frequency of use.

## 2021-06-03 NOTE — Patient Instructions (Addendum)
Upper respiratory infection Finish antibiotics and prednisone as prescribed by the urgent care. See below for symptomatic control. May use mucinex twice a day with plenty of water to loosen up the mucous.   Breathing:  Daily controller medication(s): START Symbicort 2 puffs twice a day with spacer and rinse mouth afterwards. Spacer given and demonstrated proper use with inhaler. Patient understood technique and all questions/concerned were addressed.  May use albuterol rescue inhaler 2 puffs every 4 to 6 hours as needed for shortness of breath, chest tightness, coughing, and wheezing. May use albuterol rescue inhaler 2 puffs 5 to 15 minutes prior to strenuous physical activities. Monitor frequency of use.  Breathing control goals:  Full participation in all desired activities (may need albuterol before activity) Albuterol use two times or less a week on average (not counting use with activity) Cough interfering with sleep two times or less a month Oral steroids no more than once a year No hospitalizations   Rhinitis: Use Nasacort (triamcinolone) nasal spray 1 spray per nostril twice a day as needed for nasal congestion. Sample given. Nasal saline spray (i.e., Simply Saline) or nasal saline lavage (i.e., NeilMed) is recommended as needed and prior to medicated nasal sprays. Use over the counter antihistamines such as Zyrtec (cetirizine), Claritin (loratadine), Allegra (fexofenadine), or Xyzal (levocetirizine) daily as needed. May take twice a day during allergy flares. May switch antihistamines every few months.  Plan on allergy skin testing at next visit when you are feeling better.  No antihistamines for 3 days before.  Follow up in 1 months or sooner if needed for follow up on your breathing and allergy skin testing.  Buffered Isotonic Saline Irrigations:  Goal: When you irrigate with the isotonic saline (salt water) it washes mucous and other debris from your nose that could be  contributing to your nasal symptoms.   Recipe: Obtain 1 quart jar that is clean Fill with clean (bottled, boiled or distilled) water Add 1-2 heaping teaspoons of salt without iodine If the solution with 2 teaspoons of salt is too strong, adjust the amount down until better tolerated Add 1 teaspoon of [employer] baking soda (pure bicarbonate) Mix ingredients together and store at room temperature and discard after 1 week * Alternatively you can buy pre made salt packets for the NeilMed bottle or there          are other over the counter brands available  Instructions: Warm  cup of the solution in the microwave if desired but be careful not to overheat as this will burn the inside of your nose Stand over a sink (or do it while you shower) and squirt the solution into one side of your nose aiming towards the back of your head Sometimes saying "coca cola" while irrigating can be helpful to prevent fluid from going down your throat  The solution will travel to the back of your nose and then come out the other side Perform this again on the other side Try to do this twice a day If you are using a nasal spray in addition to the irrigation, irrigate first and then use the topical nasal spray otherwise you will wash the nasal spray out of your nose  Drink plenty of fluids. Water, juice, clear broth or warm lemon water are good choices. Avoid caffeine and alcohol, which can dehydrate you. Eat chicken soup. Chicken soup and other warm fluids can be soothing and loosen congestion. Rest. Adjust your room's temperature and humidity. Keep your  room warm but not overheated. If the air is dry, a cool-mist humidifier or vaporizer can moisten the air and help ease congestion and coughing. Keep the humidifier clean to prevent the growth of bacteria and molds. Soothe your throat. Perform a saltwater gargle. Dissolve one-quarter to a half teaspoon of salt in a 4- to 8-ounce glass of warm water. This can  relieve a sore or scratchy throat temporarily. Use saline nasal drops. To help relieve nasal congestion, try saline nasal drops. You can buy these drops over the counter, and they can help relieve symptoms ? even in children. Take over-the-counter cold and cough medications. For adults and children older than 5, over-the-counter decongestants, antihistamines and pain relievers might offer some symptom relief. However, they won't prevent a cold or shorten its duration.

## 2021-06-03 NOTE — Assessment & Plan Note (Signed)
Does not take any daily meds for this. Will give handout on lifestyle and dietary modifications at next visit.

## 2021-06-11 ENCOUNTER — Ambulatory Visit: Payer: Managed Care, Other (non HMO) | Admitting: Family Medicine

## 2021-06-17 NOTE — Progress Notes (Signed)
[address] [place] [address] Dept: [phone]  FOLLOW UP NOTE  Patient ID: Jordan Mcconnell, male    DOB: 1956/05/11  Age: 65 y.o. MRN: [id number] Date of Office Visit: 06/18/2021  Assessment  Chief Complaint: Allergic Rhinitis  (Allergies are worse in the fall/dear season. Constant coughing ), Allergy Testing, and Asthma  HPI Jordan Mcconnell is a 65 year old male who presents to the clinic for a follow-up visit.  He was last seen in this clinic on 06/03/2021 by Dr. Selena Batten for evaluation of dyspnea, chronic rhinitis, and reflux.  He reports past medical history including COVID infection in 2020 as well as obstructive sleep apnea with CPAP use.  At his previous visit to this clinic, he had an upper respiratory infection for which he had taken an antibiotic and a prednisone taper.  At today's visit, he reports his asthma has been moderately well controlled with symptoms including shortness of breath, dry cough, and wheeze while talking, singing, or with activity. He continues Symbicort 80-2 puffs twice a day with a spacer and uses albuterol about once a day on a regular schedule. He reports a moderate amount of improvement of his asthma symptoms since beginning Symbicort 80.  He continues to use CPAP at night for obstructive sleep apnea management.  Allergic rhinitis is reported as poorly controlled with symptoms including clear rhinorrhea, nasal congestion, sneezing, and postnasal drainage with frequent throat clearing.  He reports the symptoms occur year-round, however, are worst during the fall season. He continues cetirizine 10 mg once a day, Flonase 2 sprays in each nostril once a day, and saline nasal rinses daily.  Reflux is reported as poorly controlled with heartburn occurring several days a week mostly after eating meals and occasional dry heaving with coughing spells. He reports that he tries to avoid eating greasy foods.  He has not previously taken a medication for reflux control.   His current medications are listed in the chart. Of note, absolute eosinophils were 0.5 K/ul on lab work from 12/24/20.  Drug Allergies:  Allergies  Allergen Reactions   Penicillins Rash    DID THE REACTION INVOLVE: Swelling of the face/tongue/throat, SOB, or low BP? No Sudden or severe rash/hives, skin peeling, or the inside of the mouth or nose? Yes Did it require medical treatment? Yes When did it last happen?      childhood allergy If all above answers are "NO", may proceed with cephalosporin use.     Sulfa Antibiotics Rash   Codeine Nausea Only   Tramadol Nausea Only    Severe dizziness    Physical Exam: BP (!) 142/82    Temp (!) 97 F (36.1 C)    Resp 16    Ht 5\' 11"  (1.803 m)    Wt 268 lb 12.8 oz (121.9 kg)    BMI 37.49 kg/m    Physical Exam Vitals reviewed.  Constitutional:      Appearance: Normal appearance.  HENT:     Head: Normocephalic and atraumatic.     Right Ear: Tympanic membrane normal.     Left Ear: Tympanic membrane normal.     Nose:     Comments: Bilateral nares slightly erythematous with clear nasal drainage noted. Pharynx normal. Ears normal. Eyes normal.    Mouth/Throat:     Pharynx: Oropharynx is clear.  Eyes:     Conjunctiva/sclera: Conjunctivae normal.  Cardiovascular:     Rate and Rhythm: Normal rate and regular rhythm.     Heart  sounds: Normal heart sounds. No murmur heard. Pulmonary:     Effort: Pulmonary effort is normal.     Breath sounds: Normal breath sounds.     Comments: Lungs clear to auscultation Musculoskeletal:        General: Normal range of motion.     Cervical back: Normal range of motion and neck supple.  Skin:    General: Skin is warm and dry.  Neurological:     Mental Status: He is alert and oriented to person, place, and time.  Psychiatric:        Mood and Affect: Mood normal.        Behavior: Behavior normal.        Thought Content: Thought content normal.        Judgment: Judgment normal.    Diagnostics: FVC  2.83, FEV1 2.28.  Predicted FVC 4.45, predicted FEV1 3.40.  Spirometry indicates possible restriction.  This is consistent with previous spirometry readings.  Percutaneous environmental testing positive to weed pollen, mold, dust mite, and cockroach with adequate controls  Intradermal skin testing was positive to grass pollen, ragweed pollen, seasonal and perennial molds with an adequate control.   Assessment and Plan: 1. Shortness of breath   2. Seasonal and perennial allergic rhinitis   3. Gastroesophageal reflux disease, unspecified whether esophagitis present   4. Obstructive sleep apnea     Meds ordered this encounter  Medications   montelukast (SINGULAIR) 10 MG tablet    Sig: Take 1 tablet (10 mg total) by mouth at bedtime.    Dispense:  30 tablet    Refill:  5   omeprazole (PRILOSEC) 40 MG capsule    Sig: Take 1 capsule (40 mg total) by mouth daily.    Dispense:  30 capsule    Refill:  5     Patient Instructions  Dyspnea Continue Symbicort 80-2 puffs twice a day with a spacer to prevent cough or wheeze Continue albuterol 2 puffs once every 4 hours as needed for cough or wheeze You may use albuterol 2 puffs 5 to 15 minutes before activity to decrease cough or wheeze  Allergic rhinitis Your skin testing was positive to grass pollen, ragweed pollen, weed pollen, mold, dust mites, and cockroach.  Avoidance measures are listed below Begin montelukast 10 mg once a day for better control of allergy symptoms. This may also help your breathing. Patient cautioned that rarely some children/adults can experience behavioral changes after beginning montelukast. These side effects are rare, however, if you notice any change, notify the clinic and discontinue montelukast. Continue an over-the-counter antihistamine once a day as needed for runny nose or itch. Remember to rotate to a different antihistamine about every 3 months. Some examples of over the counter antihistamines include Zyrtec  (cetirizine), Xyzal (levocetirizine), Allegra (fexofenadine), and Claritin (loratidine).  Continue Flonase 2 sprays in each nostril once a day as needed for stuffy nose. In the right nostril, point the applicator out toward the right ear. In the left nostril, point the applicator out toward the left ear Consider saline nasal rinses as needed for nasal symptoms. Use this before any medicated nasal sprays for best result  Reflux Continue dietary and lifestyle modifications as listed below Begin omeprazole 40 mg once a day to prevent reflux. This may help to decrease your cough  Call the clinic if this treatment plan is not working well for you.  Follow up in 1 month or sooner if needed.   Return in about 4 weeks (around  07/16/2021), or if symptoms worsen or fail to improve.    Thank you for the opportunity to care for this patient.  Please do not hesitate to contact me with questions.  Thermon Leyland, FNP [facility]

## 2021-06-17 NOTE — Patient Instructions (Addendum)
Dyspnea Continue Symbicort 80-2 puffs twice a day with a spacer to prevent cough or wheeze Continue albuterol 2 puffs once every 4 hours as needed for cough or wheeze You may use albuterol 2 puffs 5 to 15 minutes before activity to decrease cough or wheeze  Allergic rhinitis Your skin testing was positive to grass pollen, ragweed pollen, weed pollen, mold, dust mites, and cockroach.  Avoidance measures are listed below Begin montelukast 10 mg once a day for better control of allergy symptoms. This may also help your breathing. Patient cautioned that rarely some children/adults can experience behavioral changes after beginning montelukast. These side effects are rare, however, if you notice any change, notify the clinic and discontinue montelukast. Continue an over-the-counter antihistamine once a day as needed for runny nose or itch. Remember to rotate to a different antihistamine about every 3 months. Some examples of over the counter antihistamines include Zyrtec (cetirizine), Xyzal (levocetirizine), Allegra (fexofenadine), and Claritin (loratidine).  Continue Flonase 2 sprays in each nostril once a day as needed for stuffy nose. In the right nostril, point the applicator out toward the right ear. In the left nostril, point the applicator out toward the left ear Consider saline nasal rinses as needed for nasal symptoms. Use this before any medicated nasal sprays for best result  Reflux Continue dietary and lifestyle modifications as listed below Begin omeprazole 40 mg once a day to prevent reflux. This may help to decrease your cough  Call the clinic if this treatment plan is not working well for you.  Follow up in 1 month or sooner if needed.  Reducing Pollen Exposure The American Academy of Allergy, Asthma and Immunology suggests the following steps to reduce your exposure to pollen during allergy seasons. Do not hang sheets or clothing out to dry; pollen may collect on these items. Do  not mow lawns or spend time around freshly cut grass; mowing stirs up pollen. Keep windows closed at night.  Keep car windows closed while driving. Minimize morning activities outdoors, a time when pollen counts are usually at their highest. Stay indoors as much as possible when pollen counts or humidity is high and on windy days when pollen tends to remain in the air longer. Use air conditioning when possible.  Many air conditioners have filters that trap the pollen spores. Use a HEPA room air filter to remove pollen form the indoor air you breathe.  Control of Mold Allergen Mold and fungi can grow on a variety of surfaces provided certain temperature and moisture conditions exist.  Outdoor molds grow on plants, decaying vegetation and soil.  The major outdoor mold, Alternaria and Cladosporium, are found in very high numbers during hot and dry conditions.  Generally, a late Summer - Fall peak is seen for common outdoor fungal spores.  Rain will temporarily lower outdoor mold spore count, but counts rise rapidly when the rainy period ends.  The most important indoor molds are Aspergillus and Penicillium.  Dark, humid and poorly ventilated basements are ideal sites for mold growth.  The next most common sites of mold growth are the bathroom and the kitchen.  Outdoor [employer] Use air conditioning and keep windows closed Avoid exposure to decaying vegetation. Avoid leaf raking. Avoid grain handling. Consider wearing a face mask if working in moldy areas.  Indoor Mold Control Maintain humidity below 50%. Clean washable surfaces with 5% bleach solution. Remove sources e.g. Contaminated carpets.   Control of Dust Mite Allergen Dust mites play a major role  in allergic asthma and rhinitis. They occur in environments with high humidity wherever human skin is found. Dust mites absorb humidity from the atmosphere (ie, they do not drink) and feed on organic matter (including shed human and animal  skin). Dust mites are a microscopic type of insect that you cannot see with the naked eye. High levels of dust mites have been detected from mattresses, pillows, carpets, upholstered furniture, bed covers, clothes, soft toys and any woven material. The principal allergen of the dust mite is found in its feces. A gram of dust may contain 1,000 mites and 250,000 fecal particles. Mite antigen is easily measured in the air during house cleaning activities. Dust mites do not bite and do not cause harm to humans, other than by triggering allergies/asthma.  Ways to decrease your exposure to dust mites in your home:  1. Encase mattresses, box springs and pillows with a mite-impermeable barrier or cover  2. Wash sheets, blankets and drapes weekly in hot water (130 F) with detergent and dry them in a dryer on the hot setting.  3. Have the room cleaned frequently with a vacuum cleaner and a damp dust-mop. For carpeting or rugs, vacuuming with a vacuum cleaner equipped with a high-efficiency particulate air (HEPA) filter. The dust mite allergic individual should not be in a room which is being cleaned and should wait 1 hour after cleaning before going into the room.  4. Do not sleep on upholstered furniture (eg, couches).  5. If possible removing carpeting, upholstered furniture and drapery from the home is ideal. Horizontal blinds should be eliminated in the rooms where the person spends the most time (bedroom, study, television room). Washable vinyl, roller-type shades are optimal.  6. Remove all non-washable stuffed toys from the bedroom. Wash stuffed toys weekly like sheets and blankets above.  7. Reduce indoor humidity to less than 50%. Inexpensive humidity monitors can be purchased at most hardware stores. Do not use a humidifier as can make the problem worse and are not recommended.  Control of Cockroach Allergen Cockroach allergen has been identified as an important cause of acute attacks of asthma,  especially in urban settings.  There are fifty-five species of cockroach that exist in the [place], however only three, the [place], [place] species produce allergen that can affect patients with Asthma.  Allergens can be obtained from fecal particles, egg casings and secretions from cockroaches.    Remove food sources. Reduce access to water. Seal access and entry points. Spray runways with 0.5-1% Diazinon or Chlorpyrifos Blow boric acid power under stoves and refrigerator. Place bait stations (hydramethylnon) at feeding sites.   Lifestyle Changes for Controlling GERD When you have GERD, stomach acid feels as if its backing up toward your mouth. Whether or not you take medication to control your GERD, your symptoms can often be improved with lifestyle changes.   Raise Your Head Reflux is more likely to strike when youre lying down flat, because stomach fluid can flow backward more easily. Raising the head of your bed 4-6 inches can help. To do this: Slide blocks or books under the legs at the head of your bed. Or, place a wedge under the mattress. Many foam stores can make a suitable wedge for you. The wedge should run from your waist to the top of your head. Dont just prop your head on several pillows. This increases pressure on your stomach. It can make GERD worse.  Watch Your Eating Habits Certain foods may increase  the acid in your stomach or relax the lower esophageal sphincter, making GERD more likely. Its best to avoid the following: Coffee, tea, and carbonated drinks (with and without caffeine) Fatty, fried, or spicy food Mint, chocolate, onions, and tomatoes Any other foods that seem to irritate your stomach or cause you pain  Relieve the Pressure Eat smaller meals, even if you have to eat more often. Dont lie down right after you eat. Wait a few hours for your stomach to empty. Avoid tight belts and tight-fitting clothes. Lose excess  weight.  Tobacco and Alcohol Avoid smoking tobacco and drinking alcohol. They can make GERD symptoms worse.

## 2021-06-18 ENCOUNTER — Encounter: Payer: Self-pay | Admitting: Family Medicine

## 2021-06-18 ENCOUNTER — Other Ambulatory Visit: Payer: Self-pay

## 2021-06-18 ENCOUNTER — Ambulatory Visit: Payer: Managed Care, Other (non HMO) | Admitting: Family Medicine

## 2021-06-18 VITALS — BP 142/82 | Temp 97.0°F | Resp 16 | Ht 71.0 in | Wt 268.8 lb

## 2021-06-18 DIAGNOSIS — J3089 Other allergic rhinitis: Secondary | ICD-10-CM

## 2021-06-18 DIAGNOSIS — G4733 Obstructive sleep apnea (adult) (pediatric): Secondary | ICD-10-CM | POA: Diagnosis not present

## 2021-06-18 DIAGNOSIS — R0602 Shortness of breath: Secondary | ICD-10-CM | POA: Diagnosis not present

## 2021-06-18 DIAGNOSIS — K219 Gastro-esophageal reflux disease without esophagitis: Secondary | ICD-10-CM | POA: Diagnosis not present

## 2021-06-18 DIAGNOSIS — J302 Other seasonal allergic rhinitis: Secondary | ICD-10-CM

## 2021-06-18 MED ORDER — OMEPRAZOLE 40 MG PO CPDR: 40.0000 mg | DELAYED_RELEASE_CAPSULE | Freq: Every day | ORAL | 5 refills | Status: DC

## 2021-06-18 MED ORDER — MONTELUKAST SODIUM 10 MG PO TABS: 10.0000 mg | ORAL_TABLET | Freq: Every day | ORAL | 5 refills | Status: DC

## 2021-07-15 ENCOUNTER — Ambulatory Visit (INDEPENDENT_AMBULATORY_CARE_PROVIDER_SITE_OTHER): Payer: PPO | Admitting: Family Medicine

## 2021-07-15 ENCOUNTER — Encounter: Payer: Self-pay | Admitting: Family Medicine

## 2021-07-15 VITALS — BP 126/80 | HR 74 | Temp 98.3°F | Resp 16 | Ht 71.0 in | Wt 266.8 lb

## 2021-07-15 DIAGNOSIS — R109 Unspecified abdominal pain: Secondary | ICD-10-CM | POA: Diagnosis not present

## 2021-07-15 DIAGNOSIS — E119 Type 2 diabetes mellitus without complications: Secondary | ICD-10-CM

## 2021-07-15 DIAGNOSIS — Z125 Encounter for screening for malignant neoplasm of prostate: Secondary | ICD-10-CM | POA: Diagnosis not present

## 2021-07-15 DIAGNOSIS — Z0000 Encounter for general adult medical examination without abnormal findings: Secondary | ICD-10-CM

## 2021-07-15 DIAGNOSIS — R1011 Right upper quadrant pain: Secondary | ICD-10-CM

## 2021-07-15 LAB — BASIC METABOLIC PANEL
BUN: 14 mg/dL (ref 6–23)
CO2: 24 mEq/L (ref 19–32)
Calcium: 9.5 mg/dL (ref 8.4–10.5)
Chloride: 103 mEq/L (ref 96–112)
Creatinine, Ser: 0.73 mg/dL (ref 0.40–1.50)
GFR: 95.85 mL/min (ref >60.00)
Glucose, Bld: 74 mg/dL (ref 70–99)
Potassium: 3.9 mEq/L (ref 3.5–5.1)
Sodium: 137 mEq/L (ref 135–145)

## 2021-07-15 LAB — HEPATIC FUNCTION PANEL
ALT: 57 U/L — ABNORMAL HIGH (ref 0–53)
AST: 32 U/L (ref 0–37)
Albumin: 4.4 g/dL (ref 3.5–5.2)
Alkaline Phosphatase: 75 U/L (ref 39–117)
Bilirubin, Direct: 0.1 mg/dL (ref 0.0–0.3)
Total Bilirubin: 0.6 mg/dL (ref 0.2–1.2)
Total Protein: 7.1 g/dL (ref 6.0–8.3)

## 2021-07-15 LAB — CBC WITH DIFFERENTIAL/PLATELET
Basophils Absolute: 0 10*3/uL (ref 0.0–0.1)
Basophils Relative: 0.6 % (ref 0.0–3.0)
Eosinophils Absolute: 0.2 10*3/uL (ref 0.0–0.7)
Eosinophils Relative: 2.5 % (ref 0.0–5.0)
HCT: 45.8 % (ref 39.0–52.0)
Hemoglobin: 15.3 g/dL (ref 13.0–17.0)
Lymphocytes Relative: 33.7 % (ref 12.0–46.0)
Lymphs Abs: 2.3 10*3/uL (ref 0.7–4.0)
MCHC: 33.3 g/dL (ref 30.0–36.0)
MCV: 86.6 fl (ref 78.0–100.0)
Monocytes Absolute: 0.5 10*3/uL (ref 0.1–1.0)
Monocytes Relative: 7.5 % (ref 3.0–12.0)
Neutro Abs: 3.9 10*3/uL (ref 1.4–7.7)
Neutrophils Relative %: 55.7 % (ref 43.0–77.0)
Platelets: 208 10*3/uL (ref 150.0–400.0)
RBC: 5.3 Mil/uL (ref 4.22–5.81)
RDW: 13.9 % (ref 11.5–15.5)
WBC: 6.9 10*3/uL (ref 4.0–10.5)

## 2021-07-15 LAB — POCT URINALYSIS DIPSTICK
Bilirubin, UA: NEGATIVE
Blood, UA: NEGATIVE
Glucose, UA: NEGATIVE
Ketones, UA: NEGATIVE
Leukocytes, UA: NEGATIVE
Nitrite, UA: NEGATIVE
Protein, UA: NEGATIVE
Spec Grav, UA: 1.015 (ref 1.010–1.025)
Urobilinogen, UA: 0.2 E.U./dL
pH, UA: 6 (ref 5.0–8.0)

## 2021-07-15 LAB — LIPID PANEL
Cholesterol: 166 mg/dL (ref 0–200)
HDL: 33.4 mg/dL — ABNORMAL LOW (ref >39.00)
LDL Cholesterol: 93 mg/dL (ref 0–99)
NonHDL: 132.42
Total CHOL/HDL Ratio: 5
Triglycerides: 198 mg/dL — ABNORMAL HIGH (ref 0.0–149.0)
VLDL: 39.6 mg/dL (ref 0.0–40.0)

## 2021-07-15 LAB — HEMOGLOBIN A1C: Hgb A1c MFr Bld: 7.4 % — ABNORMAL HIGH (ref 4.6–6.5)

## 2021-07-15 LAB — TSH: TSH: 1.84 u[IU]/mL (ref 0.35–5.50)

## 2021-07-15 LAB — PSA: PSA: 1.19 ng/mL (ref 0.10–4.00)

## 2021-07-15 NOTE — Assessment & Plan Note (Signed)
Ongoing issue.  BMI of 37.21 coupled w/ his other medical issues qualifies as morbidly obese.  Encouraged healthy diet and regular exercise.  Will follow.

## 2021-07-15 NOTE — Assessment & Plan Note (Signed)
PE WNL w/ exception of obesity and new R CVA tenderness.  UTD on colonoscopy, Tdap.  Check labs.  Anticipatory guidance provided.

## 2021-07-15 NOTE — Progress Notes (Signed)
Subjective:    Patient ID: Jordan Mcconnell, male    DOB: Apr 11, 1956, 66 y.o.   MRN: [id number]  HPI CPE- UTD on colonoscopy, eye exam, foot exam.  UTD on Tdap.  Patient Care Team    Relationship Specialty Notifications Start End  Midge Minium, MD PCP - General Family Medicine  07/07/16   Sueanne Margarita, MD PCP - Cardiology Cardiology Admissions 07/11/18   Sueanne Margarita, MD Consulting Physician Cardiology  02/16/18     Health Maintenance  Topic Date Due   Pneumococcal Vaccine 37-49 Years old (1 - PCV) Never done   Zoster Vaccines- Shingrix (1 of 2) Never done   COVID-19 Vaccine (4 - Booster for Pfizer series) 07/12/2020   OPHTHALMOLOGY EXAM  02/11/2021   HEMOGLOBIN A1C  09/29/2021   FOOT EXAM  04/01/2022   URINE MICROALBUMIN  04/01/2022   COLONOSCOPY (Pts 45-20yrs Insurance coverage will need to be confirmed)  02/26/2026   TETANUS/TDAP  07/07/2026   INFLUENZA VACCINE  Completed   Hepatitis C Screening  Completed   HIV Screening  Completed   HPV VACCINES  Aged Out      Review of Systems Patient reports no vision/hearing changes, anorexia, fever ,adenopathy, persistant/recurrent hoarseness, chest pain, palpitations, edema, hemoptysis, gastrointestinal  bleeding (melena, rectal bleeding), abdominal pain, excessive heart burn, GU symptoms (dysuria, hematuria, voiding/incontinence issues) syncope, focal weakness, memory loss, numbness & tingling, skin/hair/nail changes, depression, anxiety, abnormal bruising/bleeding.   + R lumbar back pain- sxs started 2-3 months ago.  Has been worsening.  Worse w/ twisting, bending, certain movements.  Improves w/ lying down.  Pt doesn't think this feels like a pulled muscle.  Sxs will occur randomly.  Had severe pain this AM but is now ok.  No radiation of pain.  No hx of kidney stones.  + dysphagia- has to be careful to chew thoroughly. + chronic cough + SOB  This visit occurred during the SARS-CoV-2 public health emergency.  Safety  protocols were in place, including screening questions prior to the visit, additional usage of staff PPE, and extensive cleaning of exam room while observing appropriate contact time as indicated for disinfecting solutions.      Objective:   Physical Exam General Appearance:    Alert, cooperative, no distress, appears stated age, obese  Head:    Normocephalic, without obvious abnormality, atraumatic  Eyes:    PERRL, conjunctiva/corneas clear, EOM's intact, fundi    benign, both eyes       Ears:    Normal TM's and external ear canals, both ears  Nose:   Nares normal, septum midline, mucosa normal, no drainage   or sinus tenderness  Throat:   Lips, mucosa, and tongue normal; teeth and gums normal  Neck:   Supple, symmetrical, trachea midline, no adenopathy;       thyroid:  No enlargement/tenderness/nodules  Back:     Symmetric, no curvature, ROM normal, + R sided CVA tenderness  Lungs:     Clear to auscultation bilaterally, respirations unlabored  Chest wall:    No tenderness or deformity  Heart:    Regular rate and rhythm, S1 and S2 normal, no murmur, rub   or gallop  Abdomen:     Soft, non-tender, bowel sounds active all four quadrants,    no masses, no organomegaly  Genitalia:    Normal male without lesion, masses,discharge or tenderness  Rectal:    Deferred due to young age  Extremities:   Extremities normal, atraumatic, no  cyanosis or edema  Pulses:   2+ and symmetric all extremities  Skin:   Skin color, texture, turgor normal, no rashes or lesions  Lymph nodes:   Cervical, supraclavicular, and axillary nodes normal  Neurologic:   CNII-XII intact. Normal strength, sensation and reflexes      throughout          Assessment & Plan:  R CVA tenderness- new.  Pt reports sxs started 2-3 months ago but have become more consistent and are worsening.  States this doesn't feel like previous musculoskeletal injuries.  States when the pain hits, it can 'knock me to my knees'.  Denies  urinary sxs- no frequency, urgency, dysuria, hematuria.  UA WNL.  Will get CT stone study to assess.  Pt expressed understanding and is in agreement w/ plan.

## 2021-07-15 NOTE — Patient Instructions (Addendum)
Follow up in 3-4 months to recheck sugars We'll notify you of your lab results and make any changes if needed We'll call you with your CT appt to assess for a stone Continue to work on healthy diet and regular exercise- you can do it!!! Drink LOTS of water Call with any questions or concerns Stay Safe!!  Stay Healthy!! CONGRATS!!!

## 2021-07-15 NOTE — Assessment & Plan Note (Signed)
Chronic problem.  UTD on foot exam, eye exam, microalbumin.  Check labs.  Adjust meds prn  

## 2021-07-17 ENCOUNTER — Ambulatory Visit (HOSPITAL_BASED_OUTPATIENT_CLINIC_OR_DEPARTMENT_OTHER)
Admission: RE | Admit: 2021-07-17 | Discharge: 2021-07-17 | Disposition: A | Discharge status: Home / Self Care | Payer: PPO | Source: Ambulatory Visit | Attending: Family Medicine | Admitting: Family Medicine

## 2021-07-17 ENCOUNTER — Telehealth: Payer: Self-pay

## 2021-07-17 ENCOUNTER — Other Ambulatory Visit: Payer: Self-pay

## 2021-07-17 DIAGNOSIS — R1011 Right upper quadrant pain: Secondary | ICD-10-CM | POA: Insufficient documentation

## 2021-07-17 MED ORDER — ROSUVASTATIN CALCIUM 10 MG PO TABS: 10.0000 mg | ORAL_TABLET | Freq: Every day | ORAL | 3 refills | Status: DC

## 2021-07-17 NOTE — Telephone Encounter (Signed)
-----   Message from Midge Minium, MD sent at 07/17/2021  7:41 AM EST ----- A1C has increased slightly from 7.2 --> 7.4  I suspect this is due to holiday eating.  No changes at this time.  Your HDL (good cholesterol) is low and your LDL (bad cholesterol) has increased.  The LDL goal for someone w/ diabetes is <70.  Based on this, we need to start Crestor 10mg  nightly while working on healthy diet and regular exercise.  We will repeat your liver functions at your next visit to ensure the medication is metabolizing correctly  Remainder of labs look good!

## 2021-07-17 NOTE — Progress Notes (Signed)
Patient aware of labs and new medication. Rx sent to pharmacy

## 2021-08-13 ENCOUNTER — Ambulatory Visit: Payer: Managed Care, Other (non HMO) | Admitting: Family Medicine

## 2021-08-20 ENCOUNTER — Ambulatory Visit: Payer: Managed Care, Other (non HMO) | Admitting: Allergy

## 2021-10-12 ENCOUNTER — Other Ambulatory Visit: Payer: Self-pay | Admitting: Family Medicine

## 2021-10-28 ENCOUNTER — Encounter: Payer: Self-pay | Admitting: Family Medicine

## 2021-10-28 ENCOUNTER — Ambulatory Visit: Payer: Managed Care, Other (non HMO) | Admitting: Family Medicine

## 2021-10-28 ENCOUNTER — Ambulatory Visit (INDEPENDENT_AMBULATORY_CARE_PROVIDER_SITE_OTHER): Payer: PPO | Admitting: Family Medicine

## 2021-10-28 VITALS — BP 130/82 | HR 78 | Temp 97.8°F | Resp 16 | Ht 71.0 in | Wt 271.4 lb

## 2021-10-28 DIAGNOSIS — M79672 Pain in left foot: Secondary | ICD-10-CM

## 2021-10-28 DIAGNOSIS — G4700 Insomnia, unspecified: Secondary | ICD-10-CM | POA: Diagnosis not present

## 2021-10-28 DIAGNOSIS — E119 Type 2 diabetes mellitus without complications: Secondary | ICD-10-CM | POA: Diagnosis not present

## 2021-10-28 LAB — BASIC METABOLIC PANEL
BUN: 17 mg/dL (ref 6–23)
CO2: 27 mEq/L (ref 19–32)
Calcium: 9.2 mg/dL (ref 8.4–10.5)
Chloride: 102 mEq/L (ref 96–112)
Creatinine, Ser: 0.74 mg/dL (ref 0.40–1.50)
GFR: 95.27 mL/min (ref >60.00)
Glucose, Bld: 205 mg/dL — ABNORMAL HIGH (ref 70–99)
Potassium: 4.5 mEq/L (ref 3.5–5.1)
Sodium: 136 mEq/L (ref 135–145)

## 2021-10-28 LAB — HEMOGLOBIN A1C: Hgb A1c MFr Bld: 7.8 % — ABNORMAL HIGH (ref 4.6–6.5)

## 2021-10-28 MED ORDER — TRAZODONE HCL 50 MG PO TABS: 25.0000 mg | ORAL_TABLET | Freq: Every evening | ORAL | 3 refills | Status: DC | PRN

## 2021-10-28 NOTE — Assessment & Plan Note (Signed)
Deteriorated.  Pt has gained another 5 lbs.  He admits to eating poorly and limited physical activity.  Stressed need for attention to both.  Will continue to follow. ?

## 2021-10-28 NOTE — Progress Notes (Signed)
? ?Subjective:  ? ? Patient ID: Jordan Mcconnell, male    DOB: 1955-12-27, 66 y.o.   MRN: [id number] ? ?HPI ?DM- chronic problem, on Metformin 1000mg  BID.  UTD on microalbumin, foot exam.  Due for eye exam.  Pt admits to eating poorly.  No CP unless very anxious.  + chronic SOB.  Denies HAs, visual changes (eye exam scheduled for June), abd pain, N/V. ? ?Obesity- pt has gained 5 lbs since last visit.  Pt admits to eating poorly, no regular exercise. ? ?Insomnia- 'i'm not getting any rest'.  Pt reports he is not able to lie flat or have his chin on his chest b/c he's not able to breathe.  Pt has not had a recent sleep study.  Using CPAP nightly.  Pt is not able to turn off his brain at night and is in a difficult family situation. ? ?L foot pain- initially had pain along great toe but now has pain along the Achilles.  Typically wears work boots or flip flops.  No pain w/ walking or palpation.  Only when foot is dorsiflexed or plantarflexed.  Has been improving w/ time. ? ? ?Review of Systems ?For ROS see HPI  ?   ?Objective:  ? Physical Exam ?Vitals reviewed.  ?Constitutional:   ?   General: He is not in acute distress. ?   Appearance: Normal appearance. He is well-developed. He is obese. He is not ill-appearing.  ?HENT:  ?   Head: Normocephalic and atraumatic.  ?Eyes:  ?   Extraocular Movements: Extraocular movements intact.  ?   Conjunctiva/sclera: Conjunctivae normal.  ?   Pupils: Pupils are equal, round, and reactive to light.  ?Neck:  ?   Thyroid: No thyromegaly.  ?Cardiovascular:  ?   Rate and Rhythm: Normal rate and regular rhythm.  ?   Pulses: Normal pulses.  ?   Heart sounds: Normal heart sounds. No murmur heard. ?Pulmonary:  ?   Effort: Pulmonary effort is normal. No respiratory distress.  ?   Breath sounds: Normal breath sounds.  ?Abdominal:  ?   General: Bowel sounds are normal. There is no distension.  ?   Palpations: Abdomen is soft.  ?Musculoskeletal:     ?   General: No tenderness (no TTP along 1st  metatarsal or MTP joint of L foot, no TTP along Achille or plantar fascia).  ?   Cervical back: Normal range of motion and neck supple.  ?   Right lower leg: No edema.  ?   Left lower leg: No edema.  ?Lymphadenopathy:  ?   Cervical: No cervical adenopathy.  ?Skin: ?   General: Skin is warm and dry.  ?Neurological:  ?   General: No focal deficit present.  ?   Mental Status: He is alert and oriented to person, place, and time.  ?   Cranial Nerves: No cranial nerve deficit.  ?Psychiatric:     ?   Mood and Affect: Mood normal.     ?   Behavior: Behavior normal.  ? ? ? ? ? ?   ?Assessment & Plan:  ?L foot pain- new.  No TTP.  Pt only has mild pain w/ plantar or dorsiflexion of foot.  Reports sxs are much improved from when they first started.  No pain w/ weight bearing.  Suspect strain/sprain that is improving w/ time.  No work up at this time but pt to let me know if things don't resolve.  Pt expressed understanding and is  in agreement w/ plan.  ? ?

## 2021-10-28 NOTE — Assessment & Plan Note (Signed)
New.  Pt is in very stressful family situation.  Daughters won't speak to him and are keeping the grandchildren away from him since he started dating after wife's passing.  He is using his CPAP nightly but states he is unable to turn off his racing thoughts.  Start low dose Trazodone and monitor for improvement.  Pt expressed understanding and is in agreement w/ plan.  ?

## 2021-10-28 NOTE — Patient Instructions (Signed)
Follow up in 3-4 months to recheck sugar, BP, and cholesterol ?We'll notify you of your lab results and make any changes if needed ?START the Trazodone nightly.  Start w/ 1/2 tab and increase to full tab if needed ?Try and wear supportive and cushioned shoes- like sneakers- for your foot pain ?If no improvement in foot pain, let me know and we can make a referral ?Continue to work on healthy diet and regular exercise- you can do it! ?Call with any questions or concerns ?Stay Safe!  Stay Healthy! ?CONGRATS!!!! ?

## 2021-10-28 NOTE — Assessment & Plan Note (Signed)
Chronic problem.  Pt admits to poor eating and limited exercise.  Has gained 5 lbs since last visit.  On Metformin 1000mg  BID w/o difficulty.  UTD on foot exam, microalbumin.  Has eye exam scheduled for June.  Check labs.  Adjust meds prn  ?

## 2022-01-27 ENCOUNTER — Encounter: Payer: Self-pay | Admitting: Family Medicine

## 2022-01-27 ENCOUNTER — Ambulatory Visit (INDEPENDENT_AMBULATORY_CARE_PROVIDER_SITE_OTHER): Payer: PPO | Admitting: Family Medicine

## 2022-01-27 VITALS — BP 138/84 | HR 70 | Temp 98.0°F | Resp 16 | Ht 71.0 in | Wt 274.0 lb

## 2022-01-27 DIAGNOSIS — E785 Hyperlipidemia, unspecified: Secondary | ICD-10-CM | POA: Diagnosis not present

## 2022-01-27 DIAGNOSIS — M7742 Metatarsalgia, left foot: Secondary | ICD-10-CM

## 2022-01-27 DIAGNOSIS — R0609 Other forms of dyspnea: Secondary | ICD-10-CM

## 2022-01-27 DIAGNOSIS — E119 Type 2 diabetes mellitus without complications: Secondary | ICD-10-CM

## 2022-01-27 DIAGNOSIS — I10 Essential (primary) hypertension: Secondary | ICD-10-CM | POA: Diagnosis not present

## 2022-01-27 DIAGNOSIS — E1169 Type 2 diabetes mellitus with other specified complication: Secondary | ICD-10-CM | POA: Diagnosis not present

## 2022-01-27 LAB — LIPID PANEL
Cholesterol: 129 mg/dL (ref 0–200)
HDL: 32.9 mg/dL — ABNORMAL LOW (ref >39.00)
LDL Cholesterol: 66 mg/dL (ref 0–99)
NonHDL: 96.58
Total CHOL/HDL Ratio: 4
Triglycerides: 152 mg/dL — ABNORMAL HIGH (ref 0.0–149.0)
VLDL: 30.4 mg/dL (ref 0.0–40.0)

## 2022-01-27 LAB — CBC WITH DIFFERENTIAL/PLATELET
Basophils Absolute: 0.1 10*3/uL (ref 0.0–0.1)
Basophils Relative: 1.2 % (ref 0.0–3.0)
Eosinophils Absolute: 0.3 10*3/uL (ref 0.0–0.7)
Eosinophils Relative: 3.5 % (ref 0.0–5.0)
HCT: 42.5 % (ref 39.0–52.0)
Hemoglobin: 14.4 g/dL (ref 13.0–17.0)
Lymphocytes Relative: 30.2 % (ref 12.0–46.0)
Lymphs Abs: 2.6 10*3/uL (ref 0.7–4.0)
MCHC: 34 g/dL (ref 30.0–36.0)
MCV: 86.4 fl (ref 78.0–100.0)
Monocytes Absolute: 0.7 10*3/uL (ref 0.1–1.0)
Monocytes Relative: 8.1 % (ref 3.0–12.0)
Neutro Abs: 4.8 10*3/uL (ref 1.4–7.7)
Neutrophils Relative %: 57 % (ref 43.0–77.0)
Platelets: 171 10*3/uL (ref 150.0–400.0)
RBC: 4.91 Mil/uL (ref 4.22–5.81)
RDW: 13.5 % (ref 11.5–15.5)
WBC: 8.5 10*3/uL (ref 4.0–10.5)

## 2022-01-27 LAB — BASIC METABOLIC PANEL
BUN: 22 mg/dL (ref 6–23)
CO2: 24 mEq/L (ref 19–32)
Calcium: 9.4 mg/dL (ref 8.4–10.5)
Chloride: 100 mEq/L (ref 96–112)
Creatinine, Ser: 0.87 mg/dL (ref 0.40–1.50)
GFR: 90.56 mL/min (ref >60.00)
Glucose, Bld: 185 mg/dL — ABNORMAL HIGH (ref 70–99)
Potassium: 3.5 mEq/L (ref 3.5–5.1)
Sodium: 137 mEq/L (ref 135–145)

## 2022-01-27 LAB — MICROALBUMIN / CREATININE URINE RATIO
Creatinine,U: 203.8 mg/dL
Microalb Creat Ratio: 1 mg/g (ref 0.0–30.0)
Microalb, Ur: 2.1 mg/dL — ABNORMAL HIGH (ref 0.0–1.9)

## 2022-01-27 LAB — HEPATIC FUNCTION PANEL
ALT: 76 U/L — ABNORMAL HIGH (ref 0–53)
AST: 59 U/L — ABNORMAL HIGH (ref 0–37)
Albumin: 4.1 g/dL (ref 3.5–5.2)
Alkaline Phosphatase: 87 U/L (ref 39–117)
Bilirubin, Direct: 0.2 mg/dL (ref 0.0–0.3)
Total Bilirubin: 0.6 mg/dL (ref 0.2–1.2)
Total Protein: 7.1 g/dL (ref 6.0–8.3)

## 2022-01-27 LAB — TSH: TSH: 3.29 u[IU]/mL (ref 0.35–5.50)

## 2022-01-27 LAB — HEMOGLOBIN A1C: Hgb A1c MFr Bld: 9 % — ABNORMAL HIGH (ref 4.6–6.5)

## 2022-01-27 NOTE — Progress Notes (Signed)
   Subjective:    Patient ID: Jordan Mcconnell, male    DOB: Feb 07, 1956, 66 y.o.   MRN: [id number]  HPI DM- chronic problem, on Metformin 1000mg  BID.  Due for foot exam, microalbumin.  Eye exam scheduled.  Pt reports increased stress recently.  + diarrhea.  Denies symptomatic lows.  HTN- chronic problem, on HCTZ 25mg  daily w/ adequate control.  Denies CP, + SOB (pt has seen Dr in the past and he would like to see someone else).  Denies HAs, visual changes, edema  Hyperlipidemia- chronic problem, on Crestor 10mg  daily.  Denies abd pain, N/V.  L foot pain- pt reports intermittent pain along 1st metatarsal.  TTP.  No redness, swelling.  Occurs w/ prolonged standing/walking/activity.  Wears flip-flops regularly.  Pt reports her previously had a mass in the same foot   Review of Systems For ROS see HPI     Objective:   Physical Exam Vitals reviewed.  Constitutional:      General: He is not in acute distress.    Appearance: Normal appearance. He is well-developed. He is obese. He is not ill-appearing.  HENT:     Head: Normocephalic and atraumatic.  Eyes:     Extraocular Movements: Extraocular movements intact.     Conjunctiva/sclera: Conjunctivae normal.     Pupils: Pupils are equal, round, and reactive to light.  Neck:     Thyroid: No thyromegaly.  Cardiovascular:     Rate and Rhythm: Normal rate and regular rhythm.     Pulses: Normal pulses.     Heart sounds: Normal heart sounds. No murmur heard. Pulmonary:     Effort: Pulmonary effort is normal. No respiratory distress.     Breath sounds: Normal breath sounds.  Abdominal:     General: Bowel sounds are normal. There is no distension.     Palpations: Abdomen is soft.  Musculoskeletal:     Cervical back: Normal range of motion and neck supple.     Right lower leg: No edema.     Left lower leg: No edema.  Lymphadenopathy:     Cervical: No cervical adenopathy.  Skin:    General: Skin is warm and dry.  Neurological:      General: No focal deficit present.     Mental Status: He is alert and oriented to person, place, and time.     Cranial Nerves: No cranial nerve deficit.  Psychiatric:        Mood and Affect: Mood normal.        Behavior: Behavior normal.          Assessment & Plan:  L foot pain- new.  Pt reports pain is localized to L 1st metatarsal.  Encouraged supportive footwear.  Ice.  Voltaren gel.  Will refer to Podiatry given hx of mass.  Pt expressed understanding and is in agreement w/ plan.

## 2022-01-27 NOTE — Assessment & Plan Note (Signed)
Ongoing issue.  Pt states sxs are worsening and he frequently has the sensation that he can't catch his breath.  Pt was not pleased w/ his previous pulmonary interaction where he was apparently told, 'I'm going to prove you are old, out of shape, and overweight'.  Referral placed for 2nd opinion.

## 2022-01-27 NOTE — Patient Instructions (Signed)
Follow up in 3-4 months to recheck sugars We'll notify you of your lab results and make any changes if needed Continue to work on low carb diet and regular exercise- you can do it! We'll call you to schedule your podiatry appt and the 2nd opinion with pulmonary (lung) Make sure you are wearing good, supportive shoes.  Ice.  Voltaren gel for foot pain. Call with any questions or concerns Hang in there!!

## 2022-01-27 NOTE — Assessment & Plan Note (Signed)
Chronic problem.  Currently on Metformin 1000mg  BID.  Reports increased stress recently which has impacted sugars.  Also reports some diarrhea.  Suspect this is IBS and stress related but must consider Metformin if situation doesn't improve.  Eye exam is scheduled.  Check labs.  Adjust meds prn

## 2022-01-27 NOTE — Assessment & Plan Note (Signed)
Chronic problem.  Currently on HCTZ 25mg  daily w/ adequate but not ideal control.  Has ongoing SOB but denies CP, HAs, visual changes, edema.  Will check labs due to diuretic use.  Will follow.

## 2022-01-27 NOTE — Assessment & Plan Note (Signed)
Chronic problem.  Tolerating Crestor 10mg daily w/o difficulty.  Check labs.  Adjust meds prn  

## 2022-01-28 ENCOUNTER — Other Ambulatory Visit: Payer: Self-pay

## 2022-01-28 ENCOUNTER — Telehealth: Payer: Self-pay | Admitting: Family Medicine

## 2022-01-28 ENCOUNTER — Telehealth: Payer: Self-pay

## 2022-01-28 ENCOUNTER — Other Ambulatory Visit: Payer: Self-pay | Admitting: Family Medicine

## 2022-01-28 DIAGNOSIS — R7989 Other specified abnormal findings of blood chemistry: Secondary | ICD-10-CM

## 2022-01-28 MED ORDER — TRULICITY 0.75 MG/0.5ML ~~LOC~~ SOAJ: 0.7500 mg | SUBCUTANEOUS | 1 refills | Status: DC

## 2022-01-28 NOTE — Telephone Encounter (Signed)
Spoke w/ pt in regards to starting Trulility

## 2022-01-28 NOTE — Telephone Encounter (Signed)
-----   Message from Sheliah Hatch, MD sent at 01/27/2022  8:12 PM EDT ----- Your A1C has jumped considerably from 7.8 --> 9.0!  This indicates very poor sugar control.  Based on this, we definitely need to add medications.  I would strongly recommend starting a once weekly injection like Trulicity (same family as Ozempic) to not only lower sugars and improve A1C but to help w/ weight loss.  Please let me know if you are agreeable and we can send in the prescription.  AST and ALT (liver enzymes) are both elevated.  This will improve w/ better sugar control, healthy diet, and regular exercise.  In the meantime, please hold all tylenol and alcohol and we will repeat your liver functions at a lab only visit in 2 weeks.  Remainder of labs are stable

## 2022-01-28 NOTE — Telephone Encounter (Signed)
Prescription for Trulicity sent to pharmacy 

## 2022-01-28 NOTE — Telephone Encounter (Signed)
Caller name: Jordan Mcconnell (pt)  On DPR? :yes/no: Yes  Call back number: [phone]  Provider they see: Dr. Beverely Low  Reason for call: Pt returning your call re: meds and conversation from this morning.

## 2022-01-29 ENCOUNTER — Other Ambulatory Visit: Payer: Self-pay | Admitting: Family Medicine

## 2022-02-01 LAB — HM DIABETES EYE EXAM

## 2022-02-02 ENCOUNTER — Telehealth: Payer: Self-pay | Admitting: Pulmonary Disease

## 2022-02-02 NOTE — Telephone Encounter (Signed)
Called patient and he states that he is going to wait to call us back for a second opinion. Patient states he will talk to Dr Nathaniel Man first. Nothing further needed

## 2022-02-09 ENCOUNTER — Other Ambulatory Visit (INDEPENDENT_AMBULATORY_CARE_PROVIDER_SITE_OTHER): Payer: PPO

## 2022-02-09 DIAGNOSIS — R7989 Other specified abnormal findings of blood chemistry: Secondary | ICD-10-CM | POA: Diagnosis not present

## 2022-02-09 LAB — HEPATIC FUNCTION PANEL
ALT: 91 U/L — ABNORMAL HIGH (ref 0–53)
AST: 73 U/L — ABNORMAL HIGH (ref 0–37)
Albumin: 4.9 g/dL (ref 3.5–5.2)
Alkaline Phosphatase: 74 U/L (ref 39–117)
Bilirubin, Direct: 0.1 mg/dL (ref 0.0–0.3)
Total Bilirubin: 0.6 mg/dL (ref 0.2–1.2)
Total Protein: 7.9 g/dL (ref 6.0–8.3)

## 2022-02-10 ENCOUNTER — Ambulatory Visit: Payer: PPO | Admitting: Podiatry

## 2022-02-10 ENCOUNTER — Ambulatory Visit (INDEPENDENT_AMBULATORY_CARE_PROVIDER_SITE_OTHER): Payer: PPO

## 2022-02-10 ENCOUNTER — Encounter: Payer: Self-pay | Admitting: Podiatry

## 2022-02-10 DIAGNOSIS — M19079 Primary osteoarthritis, unspecified ankle and foot: Secondary | ICD-10-CM

## 2022-02-10 DIAGNOSIS — M7742 Metatarsalgia, left foot: Secondary | ICD-10-CM | POA: Diagnosis not present

## 2022-02-10 MED ORDER — MELOXICAM 15 MG PO TABS: 15.0000 mg | ORAL_TABLET | Freq: Every day | ORAL | 0 refills | Status: DC

## 2022-02-10 NOTE — Progress Notes (Signed)
  Subjective:  Patient ID: Jordan Mcconnell, male    DOB: Jan 24, 1956,   MRN: [id number]  Chief Complaint  Patient presents with   Foot Problem     Metatarsalgia of left foot    66 y.o. male presents for for pain in the left foot that has been going on for several months. Relates he thought it was his tight boots and has been loosening laces. Relates pain on the top of his foot and into the achilles but also relates a feeling of marbles in the bottom of the foot. Denies any treatments. Relates the pain burns and tingles.  Marland KitchenHe is diabetic and last A1c was 9.  Denies any other pedal complaints. Denies n/v/f/c.   Past Medical History:  Diagnosis Date   Arthritis    Asthma    Complication of anesthesia    one surgery pt. 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 CFT <3 seconds. Normal hair growth on digits. No edema.  Skin. No lacerations or abrasions bilateral feet.  Musculoskeletal: MMT 5/5 bilateral lower extremities in DF, PF, Inversion and Eversion. Deceased ROM in DF of ankle joint. Some tenderness noted over first TMTJ. No pain with ROM of the TMTJ. Some pain with ROM of the ankle in the achilles tendon.  Neurological: Sensation intact to light touch.   Assessment:   1. Metatarsalgia, left foot   2. Arthritis of midfoot      Plan:  Patient was evaluated and treated and all questions answered. Discussed midfoot arthritis and metatarsalgia as well as some achilles irritation with patient and treatment options. Discussed some of the burning may be coming from nerve damage in the back.  X-rays reviewed. No acute fractures or dislocations. Degenerative  changes noted throughout midfoot.  Discussed NSAIDS, topicals, and possible injections.  Prescription for meloxicam provided. Discussed stiff soled shoes and padding provided.  Discussed if pain does not improve can discuss surgical options.  Patient to follow-up as needed.    Louann Sjogren, DPM

## 2022-02-11 ENCOUNTER — Encounter: Payer: Self-pay | Admitting: Family Medicine

## 2022-02-17 ENCOUNTER — Telehealth: Payer: Self-pay | Admitting: Family Medicine

## 2022-02-17 DIAGNOSIS — R7989 Other specified abnormal findings of blood chemistry: Secondary | ICD-10-CM

## 2022-02-17 NOTE — Telephone Encounter (Signed)
Caller name: Layth Cerezo   On DPR? :yes/no: Yes  Call back number: [phone]  Provider they see: Beverely Low   Reason for call: Pt called stating that Dr.Tabori suppose to be scheduling him for a ultrasound of his liver. Please advise

## 2022-02-17 NOTE — Telephone Encounter (Signed)
Informed pt that the order for the [name] has been placed .

## 2022-02-17 NOTE — Telephone Encounter (Signed)
Order entered.  He initially said he did not want [name] so I apologize for the confusion

## 2022-02-22 ENCOUNTER — Other Ambulatory Visit: Payer: Self-pay

## 2022-02-22 ENCOUNTER — Telehealth: Payer: Self-pay | Admitting: Family Medicine

## 2022-02-22 ENCOUNTER — Other Ambulatory Visit: Payer: Self-pay | Admitting: Family Medicine

## 2022-02-22 DIAGNOSIS — E119 Type 2 diabetes mellitus without complications: Secondary | ICD-10-CM

## 2022-02-22 MED ORDER — METFORMIN HCL 1000 MG PO TABS: 1000.0000 mg | ORAL_TABLET | Freq: Two times a day (BID) | ORAL | 3 refills | Status: DC

## 2022-02-22 NOTE — Telephone Encounter (Signed)
Encourage patient to contact the pharmacy for refills or they can request refills through [facility]  (Please schedule appointment if patient has not been seen in over a year)    WHAT PHARMACY WOULD THEY LIKE THIS SENT TO: [id number] - [place], [place] - [address]  MEDICATION NAME & DOSE: metformin 1000 mg   NOTES/COMMENTS FROM PATIENT: pt states that his pharmacy told him to contact us about his refill on his metformin 1,000 mg.       Front office please notify patient: It takes 48-72 hours to process rx refill requests Ask patient to call pharmacy to ensure rx is ready before heading there.

## 2022-02-22 NOTE — Telephone Encounter (Signed)
Medication sent to the pharmacy.

## 2022-02-23 ENCOUNTER — Ambulatory Visit
Admission: RE | Admit: 2022-02-23 | Discharge: 2022-02-23 | Disposition: A | Discharge status: Home / Self Care | Payer: PPO | Source: Ambulatory Visit | Attending: Family Medicine | Admitting: Family Medicine

## 2022-02-23 DIAGNOSIS — R7989 Other specified abnormal findings of blood chemistry: Secondary | ICD-10-CM

## 2022-02-23 NOTE — Progress Notes (Signed)
Informed pt of US results  

## 2022-03-01 ENCOUNTER — Telehealth: Payer: Self-pay

## 2022-03-04 NOTE — Telephone Encounter (Signed)
Left pt another VM asking him to call and schedule his AWV apt

## 2022-03-11 ENCOUNTER — Other Ambulatory Visit: Payer: Self-pay | Admitting: Family Medicine

## 2022-03-15 ENCOUNTER — Other Ambulatory Visit: Payer: Self-pay | Admitting: Family Medicine

## 2022-04-01 ENCOUNTER — Encounter: Payer: Self-pay | Admitting: Allergy

## 2022-04-01 ENCOUNTER — Ambulatory Visit: Payer: PPO | Admitting: Allergy

## 2022-04-01 VITALS — BP 136/74 | HR 96 | Temp 98.6°F | Resp 18 | Ht 69.0 in | Wt 258.5 lb

## 2022-04-01 DIAGNOSIS — J302 Other seasonal allergic rhinitis: Secondary | ICD-10-CM

## 2022-04-01 DIAGNOSIS — R0609 Other forms of dyspnea: Secondary | ICD-10-CM

## 2022-04-01 DIAGNOSIS — J3089 Other allergic rhinitis: Secondary | ICD-10-CM

## 2022-04-01 DIAGNOSIS — K219 Gastro-esophageal reflux disease without esophagitis: Secondary | ICD-10-CM | POA: Diagnosis not present

## 2022-04-01 DIAGNOSIS — R1310 Dysphagia, unspecified: Secondary | ICD-10-CM | POA: Diagnosis not present

## 2022-04-01 MED ORDER — MONTELUKAST SODIUM 10 MG PO TABS: 10.0000 mg | ORAL_TABLET | Freq: Every day | ORAL | 3 refills | Status: DC

## 2022-04-01 NOTE — Assessment & Plan Note (Signed)
.   Continue dietary and lifestyle modifications.  . Continue omeprazole 40 mg once a day to prevent reflux.

## 2022-04-01 NOTE — Progress Notes (Signed)
Follow Up Note  RE: Jordan Mcconnell MRN: [id number] DOB: 1956-06-13 Date of Office Visit: 04/01/2022  Referring provider: Midge Minium, MD Primary care provider: Midge Minium, MD  Chief Complaint: Allergic Rhinitis , Gastroesophageal Reflux (same), and Shortness of Breath (Is having SOB. Uses a CPAP and when he lays flat he has a hard time breathing and feels like his throat closes. He has to sleep sitting up a little up. )  History of Present Illness: I had the pleasure of seeing Jordan Mcconnell for a follow up visit at the [facility] [place] [facility] [place] on 04/01/2022. He is a 66 y.o. male, who is being followed for dyspnea, allergic rhinitis, reflux. His previous allergy office visit was on 06/18/2021 with Gareth Morgan, [place]. Today is a regular follow up visit. Failed to follow up as recommended.  Dyspnea Still having dyspnea on exertion. Gets symptomatic within a few minutes if doing heavy exertion and symptoms resolve within 5-6 minutes after stopping the activity.   At night patient has trouble breathing if he doesn't have the CPAP.  Patient is not very active and does minimal physical activity.   Currently on Symbicort 8mcg 2 puffs twice a day. No flare in symptoms when misses a dose. Tried using albuterol during the above episodes with minimal benefit. Denies any ER/urgent care visits or prednisone use since the last visit.  Noticed some dysphagia and takes omeprazole 40mg  once a day.  He also mentions that he had dilatations in the past which only help for 1 year. He does not remember if he ever had a biopsy with his dilatations.  No recent GI evaluation.    Allergic rhinitis Usually has symptoms in the fall but the last few years symptoms have been less severe. Taking Singulair daily at night with good benefit.    Reflux Taking omeprazole daily.  Assessment and Plan: Thanh is a 66 y.o. male with: Dyspnea Past history - Dyspnea with coughing and wheezing  for 10 years.  Lately has been having daily symptoms and recently started using albuterol with good benefit.  Patient had COVID-19 and was followed by pulmonology in the past.  2021 CT chest showed groundglass opacities and small unchanged bilateral lung nodules. Wears CPAP and has reflux. Negative cardiac work up in the past. Interim history - no improvement with Symbicort/albuterol. Usually has DOE and symptoms resolve within 5-6 minutes of stopping activity. Today's spirometry showed some restriction most likely due to body habitus. Discussed with patient at length that his dyspnea is unlikely to be related to reactive airway as symptoms resolve fairly quickly without rescue inhaler use. More concerning for physical deconditioning and/or cardiac. May need revisit with cardiology as cardiac work up was done in 3 years ago. Stop inhalers. If you notice worsening symptoms restart Symbicort and let us know.   Seasonal and perennial allergic rhinitis Past history - 2022 skin testing was positive to grass pollen, ragweed pollen, weed pollen, mold, dust mites, and cockroach.  Interim history - doing better.  Continue environmental control measures. Continue Singulair (montelukast) 10mg  daily at night. Use over the counter antihistamines such as Zyrtec (cetirizine), Claritin (loratadine), Allegra (fexofenadine), or Xyzal (levocetirizine) daily as needed. May switch antihistamines every few months.  Dysphagia Noted dysphagia and mentions dilatations in the past. He takes PPI daily for GERD. Does not recall being diagnosed with eoe. Recommend to follow up with GI to rule out EoE.  Gastroesophageal reflux disease Continue dietary and lifestyle modifications.  Continue omeprazole 40 mg once a day to prevent reflux.  Return in about 6 months (around 09/30/2022).  Make sure you wear CPAP every night.  Follow up with PCP in October 2023 as scheduled. Recommend Covid-19 booster and flu vaccine.  Check  with PCP regarding pneumonia injections.   Meds ordered this encounter  Medications   montelukast (SINGULAIR) 10 MG tablet    Sig: Take 1 tablet (10 mg total) by mouth at bedtime.    Dispense:  90 tablet    Refill:  3   Lab Orders  No laboratory test(s) ordered today    Diagnostics: Spirometry:  Tracings reviewed. His effort: It was hard to get consistent efforts and there is a question as to whether this reflects a maximal maneuver. FVC: 3.21L FEV1: 2.31L, 57% predicted FEV1/FVC ratio: 72% Interpretation: Spirometry consistent with possible restrictive disease.  Please see scanned spirometry results for details.  Medication List:  Current Outpatient Medications  Medication Sig Dispense Refill   APPLE CIDER VINEGAR PO Take by mouth.     hydrochlorothiazide (HYDRODIURIL) 25 MG tablet TAKE 1 TABLET BY MOUTH EVERY DAY 90 tablet 1   metFORMIN (GLUCOPHAGE) 1000 MG tablet Take 1 tablet (1,000 mg total) by mouth 2 (two) times daily with a meal. 180 tablet 3   montelukast (SINGULAIR) 10 MG tablet Take 1 tablet (10 mg total) by mouth at bedtime. 90 tablet 3   omeprazole (PRILOSEC) 40 MG capsule TAKE 1 CAPSULE (40 MG TOTAL) BY MOUTH DAILY. 90 capsule 0   rosuvastatin (CRESTOR) 10 MG tablet TAKE 1 TABLET (10 MG TOTAL) BY MOUTH DAILY AT BEDTIME (Patient not taking: Reported on 04/01/2022) 90 tablet 1   TRULICITY 0.75 MG/0.5ML SOPN INJECT 0.75 MG SUBCUTANEOUSLY ONE TIME PER WEEK (Patient not taking: Reported on 04/01/2022) 2 mL 1   No current facility-administered medications for this visit.   Allergies: Allergies  Allergen Reactions   Penicillins Rash    DID THE REACTION INVOLVE: Swelling of the face/tongue/throat, SOB, or low BP? No Sudden or severe rash/hives, skin peeling, or the inside of the mouth or nose? Yes Did it require medical treatment? Yes When did it last happen?      childhood allergy If all above answers are "NO", may proceed with cephalosporin use.     Sulfa  Antibiotics Rash   Codeine Nausea Only   Tramadol Nausea Only    Severe dizziness   I reviewed his past medical history, social history, family history, and environmental history and no significant changes have been reported from his previous visit.  Review of Systems  Constitutional:  Negative for appetite change, chills, fever and unexpected weight change.  HENT:  Negative for congestion, postnasal drip and rhinorrhea.   Eyes:  Negative for itching.  Respiratory:  Positive for cough and shortness of breath. Negative for chest tightness and wheezing.   Cardiovascular:  Negative for chest pain.  Gastrointestinal:  Negative for abdominal pain.  Genitourinary:  Negative for difficulty urinating.  Skin:  Negative for rash.  Allergic/Immunologic: Positive for environmental allergies.    Objective: BP 136/74   Pulse 96   Temp 98.6 F (37 C)   Resp 18   Ht 5\' 9"  (1.753 m)   Wt 258 lb 8 oz (117.3 kg)   SpO2 96%   BMI 38.17 kg/m  Body mass index is 38.17 kg/m. Physical Exam Vitals and nursing note reviewed.  Constitutional:      Appearance: Normal appearance. He is well-developed. He is obese.  HENT:     Head: Normocephalic and atraumatic.     Right Ear: Tympanic membrane and external ear normal.     Left Ear: Tympanic membrane and external ear normal.     Nose: Nose normal.     Mouth/Throat:     Mouth: Mucous membranes are moist.     Pharynx: Oropharynx is clear.  Eyes:     Conjunctiva/sclera: Conjunctivae normal.  Cardiovascular:     Rate and Rhythm: Normal rate and regular rhythm.     Heart sounds: Normal heart sounds. No murmur heard.    No friction rub. No gallop.  Pulmonary:     Effort: Pulmonary effort is normal.     Breath sounds: Normal breath sounds. No wheezing, rhonchi or rales.  Musculoskeletal:     Cervical back: Neck supple.  Skin:    General: Skin is warm.     Findings: No rash.  Neurological:     Mental Status: He is alert and oriented to person,  place, and time.  Psychiatric:        Behavior: Behavior normal.   Previous notes and tests were reviewed. The plan was reviewed with the patient/family, and all questions/concerned were addressed.  It was my pleasure to see Diyari today and participate in his care. Please feel free to contact me with any questions or concerns.  Sincerely,  Wyline Mood, DO Allergy & Immunology  [facility] office: [phone] [facility] office: [phone]

## 2022-04-01 NOTE — Assessment & Plan Note (Signed)
Past history - Dyspnea with coughing and wheezing for 10 years.  Lately has been having daily symptoms and recently started using albuterol with good benefit.  Patient had COVID-19 and was followed by pulmonology in the past.  2021 CT chest showed groundglass opacities and small unchanged bilateral lung nodules. Wears CPAP and has reflux. Negative cardiac work up in the past. Interim history - no improvement with Symbicort/albuterol. Usually has DOE and symptoms resolve within 5-6 minutes of stopping activity.  Today's spirometry showed some restriction most likely due to body habitus. . Discussed with patient at length that his dyspnea is unlikely to be related to reactive airway as symptoms resolve fairly quickly without rescue inhaler use. o More concerning for physical deconditioning and/or cardiac. May need revisit with cardiology as cardiac work up was done in 3 years ago. Marland Kitchen Stop inhalers. . If you notice worsening symptoms restart Symbicort and let us know.

## 2022-04-01 NOTE — Assessment & Plan Note (Signed)
Past history - 2022 skin testing was positive to grass pollen, ragweed pollen, weed pollen, mold, dust mites, and cockroach.  Interim history - doing better.  . Continue environmental control measures. . Continue Singulair (montelukast) 10mg  daily at night. . Use over the counter antihistamines such as Zyrtec (cetirizine), Claritin (loratadine), Allegra (fexofenadine), or Xyzal (levocetirizine) daily as needed. May switch antihistamines every few months.

## 2022-04-01 NOTE — Patient Instructions (Addendum)
Shortness of breath Unlikely to be caused due to lung issues. More concerning for physical deconditioning and/or cardiac. Stop inhalers. If you notice worsening symptoms restart Symbicort and let us know.   Allergic rhinitis 2022 skin testing was positive to grass pollen, ragweed pollen, weed pollen, mold, dust mites, and cockroach.  Continue environmental control measures. Continue Singulair (montelukast) 10mg  daily at night. Use over the counter antihistamines such as Zyrtec (cetirizine), Claritin (loratadine), Allegra (fexofenadine), or Xyzal (levocetirizine) daily as needed. May switch antihistamines every few months.  Reflux Continue dietary and lifestyle modifications.  Continue omeprazole 40 mg once a day to prevent reflux. Recommend to follow up with GI for your difficulty swallowing issues.  Make sure you wear CPAP every night.   Follow up in 6 months or sooner if needed. Follow up with PCP in October 2023 as scheduled.  Recommend Covid-19 booster and flu vaccine.  Check with PCP regarding pneumonia injections.

## 2022-04-01 NOTE — Assessment & Plan Note (Signed)
Noted dysphagia and mentions dilatations in the past. He takes PPI daily for GERD. Does not recall being diagnosed with eoe.  Recommend to follow up with GI to rule out EoE.

## 2022-04-29 ENCOUNTER — Encounter: Payer: Self-pay | Admitting: Family Medicine

## 2022-04-29 ENCOUNTER — Ambulatory Visit (INDEPENDENT_AMBULATORY_CARE_PROVIDER_SITE_OTHER): Payer: PPO | Admitting: Family Medicine

## 2022-04-29 VITALS — BP 128/82 | HR 63 | Temp 97.0°F | Resp 16 | Ht 69.0 in | Wt 243.4 lb

## 2022-04-29 DIAGNOSIS — E119 Type 2 diabetes mellitus without complications: Secondary | ICD-10-CM

## 2022-04-29 LAB — BASIC METABOLIC PANEL
BUN: 21 mg/dL (ref 6–23)
CO2: 28 mEq/L (ref 19–32)
Calcium: 9.7 mg/dL (ref 8.4–10.5)
Chloride: 100 mEq/L (ref 96–112)
Creatinine, Ser: 0.68 mg/dL (ref 0.40–1.50)
GFR: 97.39 mL/min (ref >60.00)
Glucose, Bld: 95 mg/dL (ref 70–99)
Potassium: 4.5 mEq/L (ref 3.5–5.1)
Sodium: 138 mEq/L (ref 135–145)

## 2022-04-29 LAB — HEMOGLOBIN A1C: Hgb A1c MFr Bld: 6.1 % (ref 4.6–6.5)

## 2022-04-29 NOTE — Assessment & Plan Note (Signed)
Chronic problem.  Currently on Metformin 1000mg  BID and taking more regularly.  Has lost 16 lbs by changing his diet.  Applauded his efforts.  UTD on eye exam, foot exam, and microalbumin.  Check labs.  Adjust meds prn

## 2022-04-29 NOTE — Progress Notes (Signed)
   Subjective:    Patient ID: Jordan Mcconnell, male    DOB: 04-01-1956, 66 y.o.   MRN: [id number]  HPI DM- chronic problem.  On Metformin 1000mg  BID.  Never started the Trulicity.  Is down 16 lbs since last visit.  Pt reports 'i've been eating right'.  Has started taking medication regularly.  UTD on eye exam, foot exam, microalbumin.  No CP.  Episodic SOB.  No HAs, visual changes, abd pain, N/V, edema.  Will have 'shock like' sensations of feet intermittently.  Denies symptomatic lows.     Review of Systems For ROS see HPI     Objective:   Physical Exam Vitals reviewed.  Constitutional:      General: He is not in acute distress.    Appearance: Normal appearance. He is well-developed. He is not ill-appearing.  HENT:     Head: Normocephalic and atraumatic.  Eyes:     Extraocular Movements: Extraocular movements intact.     Conjunctiva/sclera: Conjunctivae normal.     Pupils: Pupils are equal, round, and reactive to light.  Neck:     Thyroid: No thyromegaly.  Cardiovascular:     Rate and Rhythm: Normal rate and regular rhythm.     Pulses: Normal pulses.     Heart sounds: Normal heart sounds. No murmur heard. Pulmonary:     Effort: Pulmonary effort is normal. No respiratory distress.     Breath sounds: Normal breath sounds.  Abdominal:     General: Bowel sounds are normal. There is no distension.     Palpations: Abdomen is soft.  Musculoskeletal:     Cervical back: Normal range of motion and neck supple.     Right lower leg: No edema.     Left lower leg: No edema.  Lymphadenopathy:     Cervical: No cervical adenopathy.  Skin:    General: Skin is warm and dry.  Neurological:     General: No focal deficit present.     Mental Status: He is alert and oriented to person, place, and time.     Cranial Nerves: No cranial nerve deficit.  Psychiatric:        Mood and Affect: Mood normal.        Behavior: Behavior normal.           Assessment & Plan:

## 2022-04-29 NOTE — Patient Instructions (Signed)
Schedule your complete physical in 3-4 months We'll notify you of your lab results and make any changes if needed Continue to work on healthy diet and regular exercise- you look great! Call with any questions or concerns Stay Safe!  Stay Healthy! Happy Fall!!

## 2022-04-30 NOTE — Progress Notes (Signed)
Informed pt of lab results  

## 2022-06-10 ENCOUNTER — Ambulatory Visit (INDEPENDENT_AMBULATORY_CARE_PROVIDER_SITE_OTHER): Payer: PPO | Admitting: Family Medicine

## 2022-06-10 ENCOUNTER — Encounter: Payer: Self-pay | Admitting: Family Medicine

## 2022-06-10 ENCOUNTER — Telehealth: Payer: Self-pay

## 2022-06-10 VITALS — BP 118/78 | HR 71 | Temp 97.5°F | Resp 18 | Ht 69.0 in | Wt 244.4 lb

## 2022-06-10 DIAGNOSIS — R197 Diarrhea, unspecified: Secondary | ICD-10-CM | POA: Diagnosis not present

## 2022-06-10 DIAGNOSIS — R1013 Epigastric pain: Secondary | ICD-10-CM | POA: Diagnosis not present

## 2022-06-10 LAB — BASIC METABOLIC PANEL
BUN: 21 mg/dL (ref 6–23)
CO2: 26 mEq/L (ref 19–32)
Calcium: 9.6 mg/dL (ref 8.4–10.5)
Chloride: 99 mEq/L (ref 96–112)
Creatinine, Ser: 0.8 mg/dL (ref 0.40–1.50)
GFR: 92.65 mL/min (ref >60.00)
Glucose, Bld: 101 mg/dL — ABNORMAL HIGH (ref 70–99)
Potassium: 4 mEq/L (ref 3.5–5.1)
Sodium: 138 mEq/L (ref 135–145)

## 2022-06-10 LAB — CBC WITH DIFFERENTIAL/PLATELET
Basophils Absolute: 0 10*3/uL (ref 0.0–0.1)
Basophils Relative: 0.6 % (ref 0.0–3.0)
Eosinophils Absolute: 0.4 10*3/uL (ref 0.0–0.7)
Eosinophils Relative: 5 % (ref 0.0–5.0)
HCT: 47.9 % (ref 39.0–52.0)
Hemoglobin: 16.1 g/dL (ref 13.0–17.0)
Lymphocytes Relative: 30.6 % (ref 12.0–46.0)
Lymphs Abs: 2.5 10*3/uL (ref 0.7–4.0)
MCHC: 33.7 g/dL (ref 30.0–36.0)
MCV: 86.2 fl (ref 78.0–100.0)
Monocytes Absolute: 0.5 10*3/uL (ref 0.1–1.0)
Monocytes Relative: 6.1 % (ref 3.0–12.0)
Neutro Abs: 4.8 10*3/uL (ref 1.4–7.7)
Neutrophils Relative %: 57.7 % (ref 43.0–77.0)
Platelets: 209 10*3/uL (ref 150.0–400.0)
RBC: 5.55 Mil/uL (ref 4.22–5.81)
RDW: 13.9 % (ref 11.5–15.5)
WBC: 8.3 10*3/uL (ref 4.0–10.5)

## 2022-06-10 LAB — LIPASE: Lipase: 5 U/L — ABNORMAL LOW (ref 11.0–59.0)

## 2022-06-10 LAB — HEPATIC FUNCTION PANEL
ALT: 45 U/L (ref 0–53)
AST: 21 U/L (ref 0–37)
Albumin: 4.6 g/dL (ref 3.5–5.2)
Alkaline Phosphatase: 84 U/L (ref 39–117)
Bilirubin, Direct: 0.1 mg/dL (ref 0.0–0.3)
Total Bilirubin: 0.6 mg/dL (ref 0.2–1.2)
Total Protein: 7.3 g/dL (ref 6.0–8.3)

## 2022-06-10 LAB — TSH: TSH: 2.54 u[IU]/mL (ref 0.35–5.50)

## 2022-06-10 LAB — AMYLASE: Amylase: 30 U/L (ref 27–131)

## 2022-06-10 LAB — H. PYLORI ANTIBODY, IGG: H Pylori IgG: NEGATIVE

## 2022-06-10 MED ORDER — OMEPRAZOLE 40 MG PO CPDR: 40.0000 mg | DELAYED_RELEASE_CAPSULE | Freq: Every day | ORAL | 0 refills | Status: DC

## 2022-06-10 NOTE — Telephone Encounter (Signed)
Informed pt of lab results  

## 2022-06-10 NOTE — Patient Instructions (Addendum)
Follow up as needed or as scheduled We'll notify you of your lab results and make any changes RESTART the Omeprazole 40mg  daily STOP the Metformin for 2-3 weeks to see if diarrhea improves Try and eat a low carb/low sugar diet Drink LOTS of water Call with any questions or concerns Hang in there! Happy Holidays!!

## 2022-06-10 NOTE — Telephone Encounter (Signed)
-----   Message from Sheliah Hatch, MD sent at 06/10/2022  4:07 PM EST ----- Labs all look great!  No evidence of H pylori

## 2022-06-10 NOTE — Progress Notes (Signed)
   Subjective:    Patient ID: Jordan Mcconnell, male    DOB: 1955/12/20, 66 y.o.   MRN: [id number]  HPI Abd pain- pt reports Sunday had had substernal/epigastric gain.  At the time it 'hurt real bad'.  It has improved somewhat since then.  Doesn't seem to improve or worsen w/ eating.  + GERD.  + Diarrhea- pt reports this has been going on for 'over a year' but has recently worsened.  Diarrhea did not improve when he was not taking Metformin regularly.  Fatigue.  No fever.  Not currently on Omeprazole.  No recent abx use.     Review of Systems For ROS see HPI     Objective:   Physical Exam Vitals reviewed.  Constitutional:      General: He is not in acute distress.    Appearance: He is well-developed. He is obese. He is not ill-appearing.  HENT:     Head: Normocephalic and atraumatic.  Cardiovascular:     Rate and Rhythm: Normal rate and regular rhythm.  Abdominal:     General: Abdomen is protuberant. Bowel sounds are normal. There is no distension or abdominal bruit.     Palpations: Abdomen is soft.     Tenderness: There is abdominal tenderness in the epigastric area.     Comments: Mild TTP over epigastrum  Skin:    General: Skin is warm and dry.  Neurological:     General: No focal deficit present.     Mental Status: He is alert.  Psychiatric:        Mood and Affect: Mood normal.        Behavior: Behavior normal.           Assessment & Plan:   Epigastric abd pain- new.  Suspect this is due to untreated GERD as pt is not currently on PPI.  Restart Omeprazole 40mg  daily.  As wife has H pylori, check labs.  Discussed dietary modifications that will improve sxs.  Will follow.  Diarrhea- new.  Pt reports this has been going on for over a year but recently worsened.  He thought it was due to his Metformin but even when he wasn't taking his Metformin regularly he was having diarrhea.  Pt has hx of IBS-D and has been under considerable stress.  Will hold Metformin for 2-3 weeks  to see if sxs improve.  If not, will need GI referral.  Pt expressed understanding and is in agreement w/ plan.

## 2022-08-12 ENCOUNTER — Encounter: Payer: PPO | Admitting: Family Medicine

## 2022-08-30 ENCOUNTER — Other Ambulatory Visit: Payer: Self-pay | Admitting: Family Medicine

## 2022-09-12 ENCOUNTER — Other Ambulatory Visit: Payer: Self-pay | Admitting: Family Medicine

## 2022-09-13 ENCOUNTER — Ambulatory Visit (INDEPENDENT_AMBULATORY_CARE_PROVIDER_SITE_OTHER): Payer: PPO | Admitting: Family Medicine

## 2022-09-13 ENCOUNTER — Encounter: Payer: Self-pay | Admitting: Family Medicine

## 2022-09-13 VITALS — BP 138/80 | HR 56 | Temp 98.8°F | Resp 17 | Ht 69.0 in | Wt 273.4 lb

## 2022-09-13 DIAGNOSIS — E785 Hyperlipidemia, unspecified: Secondary | ICD-10-CM

## 2022-09-13 DIAGNOSIS — E1169 Type 2 diabetes mellitus with other specified complication: Secondary | ICD-10-CM | POA: Diagnosis not present

## 2022-09-13 DIAGNOSIS — Z125 Encounter for screening for malignant neoplasm of prostate: Secondary | ICD-10-CM

## 2022-09-13 DIAGNOSIS — Z0000 Encounter for general adult medical examination without abnormal findings: Secondary | ICD-10-CM | POA: Diagnosis not present

## 2022-09-13 LAB — HEPATIC FUNCTION PANEL
ALT: 53 U/L (ref 0–53)
AST: 31 U/L (ref 0–37)
Albumin: 4 g/dL (ref 3.5–5.2)
Alkaline Phosphatase: 78 U/L (ref 39–117)
Bilirubin, Direct: 0.1 mg/dL (ref 0.0–0.3)
Total Bilirubin: 0.4 mg/dL (ref 0.2–1.2)
Total Protein: 7 g/dL (ref 6.0–8.3)

## 2022-09-13 LAB — CBC WITH DIFFERENTIAL/PLATELET
Basophils Absolute: 0.1 10*3/uL (ref 0.0–0.1)
Basophils Relative: 0.8 % (ref 0.0–3.0)
Eosinophils Absolute: 0.3 10*3/uL (ref 0.0–0.7)
Eosinophils Relative: 3.9 % (ref 0.0–5.0)
HCT: 44.9 % (ref 39.0–52.0)
Hemoglobin: 15.2 g/dL (ref 13.0–17.0)
Lymphocytes Relative: 36.7 % (ref 12.0–46.0)
Lymphs Abs: 2.5 10*3/uL (ref 0.7–4.0)
MCHC: 33.8 g/dL (ref 30.0–36.0)
MCV: 86.8 fl (ref 78.0–100.0)
Monocytes Absolute: 0.4 10*3/uL (ref 0.1–1.0)
Monocytes Relative: 5.6 % (ref 3.0–12.0)
Neutro Abs: 3.7 10*3/uL (ref 1.4–7.7)
Neutrophils Relative %: 53 % (ref 43.0–77.0)
Platelets: 181 10*3/uL (ref 150.0–400.0)
RBC: 5.17 Mil/uL (ref 4.22–5.81)
RDW: 14.1 % (ref 11.5–15.5)
WBC: 6.9 10*3/uL (ref 4.0–10.5)

## 2022-09-13 LAB — BASIC METABOLIC PANEL
BUN: 14 mg/dL (ref 6–23)
CO2: 28 mEq/L (ref 19–32)
Calcium: 9.7 mg/dL (ref 8.4–10.5)
Chloride: 101 mEq/L (ref 96–112)
Creatinine, Ser: 0.73 mg/dL (ref 0.40–1.50)
GFR: 95.07 mL/min (ref >60.00)
Glucose, Bld: 94 mg/dL (ref 70–99)
Potassium: 4.5 mEq/L (ref 3.5–5.1)
Sodium: 136 mEq/L (ref 135–145)

## 2022-09-13 LAB — LIPID PANEL
Cholesterol: 141 mg/dL (ref 0–200)
HDL: 35.6 mg/dL — ABNORMAL LOW (ref >39.00)
LDL Cholesterol: 71 mg/dL (ref 0–99)
NonHDL: 105.35
Total CHOL/HDL Ratio: 4
Triglycerides: 174 mg/dL — ABNORMAL HIGH (ref 0.0–149.0)
VLDL: 34.8 mg/dL (ref 0.0–40.0)

## 2022-09-13 LAB — HEMOGLOBIN A1C: Hgb A1c MFr Bld: 6.8 % — ABNORMAL HIGH (ref 4.6–6.5)

## 2022-09-13 NOTE — Progress Notes (Signed)
   Subjective:    Patient ID: Jordan Mcconnell, male    DOB: Jun 22, 1956, 67 y.o.   MRN: [id number]  HPI CPE- UTD on microalbumin, foot exam, eye exam, colonoscopy, Tdap, flu.  Declines PNA  Patient Care Team    Relationship Specialty Notifications Start End  Midge Minium, MD PCP - General Family Medicine  02/01/22   Sueanne Margarita, MD PCP - Cardiology Cardiology Admissions 07/11/18   Sueanne Margarita, MD Consulting Physician Cardiology  02/16/18     Health Maintenance  Topic Date Due   Medicare Annual Wellness (AWV)  Never done   HEMOGLOBIN A1C  10/29/2022   Diabetic kidney evaluation - Urine ACR  01/28/2023   FOOT EXAM  01/28/2023   OPHTHALMOLOGY EXAM  02/02/2023   Diabetic kidney evaluation - eGFR measurement  06/11/2023   COLONOSCOPY (Pts 45-72yrs Insurance coverage will need to be confirmed)  02/26/2026   DTaP/Tdap/Td (2 - Td or Tdap) 07/07/2026   INFLUENZA VACCINE  Completed   Hepatitis C Screening  Completed   HPV VACCINES  Aged Out   Pneumonia Vaccine 98+ Years old  Discontinued   COVID-19 Vaccine  Discontinued   Zoster Vaccines- Shingrix  Discontinued      Review of Systems Patient reports no vision/hearing changes, anorexia, fever ,adenopathy, persistant/recurrent hoarseness, swallowing issues, chest pain, palpitations, edema, persistant/recurrent cough, hemoptysis, dyspnea (rest,exertional, paroxysmal nocturnal), gastrointestinal  bleeding (melena, rectal bleeding), excessive heart burn, GU symptoms (dysuria, hematuria, voiding/incontinence issues) syncope, focal weakness, memory loss, numbness & tingling, skin/hair/nail changes, depression, anxiety, abnormal bruising/bleeding, musculoskeletal symptoms/signs.   + 30 lb weight gain + episodic RUQ pain- no N/V    Objective:   Physical Exam General Appearance:    Alert, cooperative, no distress, appears stated age, obese  Head:    Normocephalic, without obvious abnormality, atraumatic  Eyes:    PERRL,  conjunctiva/corneas clear, EOM's intact both eyes       Ears:    Normal TM's and external ear canals, both ears  Nose:   Nares normal, septum midline, mucosa normal, no drainage   or sinus tenderness  Throat:   Lips, mucosa, and tongue normal; teeth and gums normal  Neck:   Supple, symmetrical, trachea midline, no adenopathy;       thyroid:  No enlargement/tenderness/nodules  Back:     Symmetric, no curvature, ROM normal, no CVA tenderness  Lungs:     Clear to auscultation bilaterally, respirations unlabored  Chest wall:    No tenderness or deformity  Heart:    Regular rate and rhythm, S1 and S2 normal, no murmur, rub   or gallop  Abdomen:     Soft, non-tender, bowel sounds active all four quadrants,    no masses, no organomegaly  Genitalia:    Deferred   Rectal:    Extremities:   Extremities normal, atraumatic, no cyanosis or edema  Pulses:   2+ and symmetric all extremities  Skin:   Skin color, texture, turgor normal, no rashes or lesions  Lymph nodes:   Cervical, supraclavicular, and axillary nodes normal  Neurologic:   CNII-XII intact. Normal strength, sensation and reflexes      throughout          Assessment & Plan:

## 2022-09-13 NOTE — Patient Instructions (Signed)
Follow up in 3-4 months to recheck sugar We'll notify you of your lab results and make any changes if needed Continue to work on low carb, low sugar diet and regular physical activity Call with any questions or concerns Stay Safe!  Stay Healthy! Happy Spring!!!

## 2022-09-13 NOTE — Assessment & Plan Note (Signed)
Deteriorated.  Pt has gained 30 lbs in 3 months.  Stressed need for low carb diet and regular physical activity.  Check labs to risk stratify.  Will follow.

## 2022-09-13 NOTE — Assessment & Plan Note (Signed)
Chronic problem.  Stopped his Crestor.  Check labs and restart meds prn.

## 2022-09-13 NOTE — Assessment & Plan Note (Signed)
Pt's PE WNL w/ exception of BMI.  UTD on colonoscopy, Tdap, flu.  Declines PNA and shingles.  Check labs.  Anticipatory guidance provided.

## 2022-09-14 LAB — TSH: TSH: 2.41 u[IU]/mL (ref 0.35–5.50)

## 2022-09-15 ENCOUNTER — Ambulatory Visit: Payer: PPO | Admitting: Family Medicine

## 2022-09-15 ENCOUNTER — Telehealth: Payer: Self-pay

## 2022-09-15 NOTE — Telephone Encounter (Signed)
Informed pt of lab results  

## 2022-09-15 NOTE — Telephone Encounter (Signed)
-----   Message from Midge Minium, MD sent at 09/15/2022  7:21 AM EDT ----- Labs look good but A1C has increased from 6.1 --> 6.8%  Please be mindful of a low carb/low sugar diet and regular physical activity.  No med changes at this time

## 2022-09-28 ENCOUNTER — Telehealth: Payer: Self-pay | Admitting: Family Medicine

## 2022-09-28 NOTE — Telephone Encounter (Signed)
Called patient to schedule Medicare Annual Wellness Visit (AWV). Left message for patient to call back and schedule Medicare Annual Wellness Visit (AWV).  Last date of AWV: AWVI eligible as of  07/05/2022   Please schedule an AWVI appointment at any time with [place] [facility].  If any questions, please contact me at [phone].    Thank you,  [place] Direct dial  [phone]

## 2022-09-28 NOTE — Telephone Encounter (Signed)
Contacted Jordan Mcconnell to schedule their annual wellness visit. Appointment made for 10/07/2022.  Thank you,  [place] Direct dial  [phone]

## 2022-10-07 ENCOUNTER — Ambulatory Visit (INDEPENDENT_AMBULATORY_CARE_PROVIDER_SITE_OTHER): Payer: PPO | Admitting: *Deleted

## 2022-10-07 DIAGNOSIS — Z0000 Encounter for general adult medical examination without abnormal findings: Secondary | ICD-10-CM | POA: Diagnosis not present

## 2022-10-07 NOTE — Patient Instructions (Signed)
[name] , Thank you for taking time to come for your Medicare Wellness Visit. I appreciate your ongoing commitment to your health goals. Please review the following plan we discussed and let me know if I can assist you in the future.   Screening recommendations/referrals: Colonoscopy: up to date Recommended yearly ophthalmology/optometry visit for glaucoma screening and checkup Recommended yearly dental visit for hygiene and checkup  Vaccinations: Influenza vaccine: up to date Pneumococcal vaccine: Education provided Tdap vaccine: up to date Shingles vaccine: Education provided    Advanced directives: on file      Preventive Care 66 Years and Older, Male Preventive care refers to lifestyle choices and visits with your health care provider that can promote health and wellness. What does preventive care include? A yearly physical exam. This is also called an annual well check. Dental exams once or twice a year. Routine eye exams. Ask your health care provider how often you should have your eyes checked. Personal lifestyle choices, including: Daily care of your teeth and gums. Regular physical activity. Eating a healthy diet. Avoiding tobacco and drug use. Limiting alcohol use. Practicing safe sex. Taking low doses of aspirin every day. Taking vitamin and mineral supplements as recommended by your health care provider. What happens during an annual well check? The services and screenings done by your health care provider during your annual well check will depend on your age, overall health, lifestyle risk factors, and family history of disease. Counseling  Your health care provider may ask you questions about your: Alcohol use. Tobacco use. Drug use. Emotional well-being. Home and relationship well-being. Sexual activity. Eating habits. History of falls. Memory and ability to understand (cognition). Work and work Statistician. Screening  You may have the following  tests or measurements: Height, weight, and BMI. Blood pressure. Lipid and cholesterol levels. These may be checked every 5 years, or more frequently if you are over 84 years old. Skin check. Lung cancer screening. You may have this screening every year starting at age 62 if you have a 30-pack-year history of smoking and currently smoke or have quit within the past 15 years. Fecal occult blood test (FOBT) of the stool. You may have this test every year starting at age 33. Flexible sigmoidoscopy or colonoscopy. You may have a sigmoidoscopy every 5 years or a colonoscopy every 10 years starting at age 24. Prostate cancer screening. Recommendations will vary depending on your family history and other risks. Hepatitis C blood test. Hepatitis B blood test. Sexually transmitted disease (STD) testing. Diabetes screening. This is done by checking your blood sugar (glucose) after you have not eaten for a while (fasting). You may have this done every 1-3 years. Abdominal aortic aneurysm (AAA) screening. You may need this if you are a current or former smoker. Osteoporosis. You may be screened starting at age 65 if you are at high risk. Talk with your health care provider about your test results, treatment options, and if necessary, the need for more tests. Vaccines  Your health care provider may recommend certain vaccines, such as: Influenza vaccine. This is recommended every year. Tetanus, diphtheria, and acellular pertussis (Tdap, Td) vaccine. You may need a Td booster every 10 years. Zoster vaccine. You may need this after age 48. Pneumococcal 13-valent conjugate (PCV13) vaccine. One dose is recommended after age 28. Pneumococcal polysaccharide (PPSV23) vaccine. One dose is recommended after age 7. Talk to your health care provider about which screenings and vaccines you need and how often you need them.  This information is not intended to replace advice given to you by your health care provider.  Make sure you discuss any questions you have with your health care provider. Document Released: 07/18/2015 Document Revised: 03/10/2016 Document Reviewed: 04/22/2015 Elsevier Interactive Patient Education  2017 [employer] Prevention in the Home Falls can cause injuries. They can happen to people of all ages. There are many things you can do to make your home safe and to help prevent falls. What can I do on the outside of my home? Regularly fix the edges of walkways and driveways and fix any cracks. Remove anything that might make you trip as you walk through a door, such as a raised step or threshold. Trim any bushes or trees on the path to your home. Use bright outdoor lighting. Clear any walking paths of anything that might make someone trip, such as rocks or tools. Regularly check to see if handrails are loose or broken. Make sure that both sides of any steps have handrails. Any raised decks and porches should have guardrails on the edges. Have any leaves, snow, or ice cleared regularly. Use sand or salt on walking paths during winter. Clean up any spills in your garage right away. This includes oil or grease spills. What can I do in the bathroom? Use night lights. Install grab bars by the toilet and in the tub and shower. Do not use towel bars as grab bars. Use non-skid mats or decals in the tub or shower. If you need to sit down in the shower, use a plastic, non-slip stool. Keep the floor dry. Clean up any water that spills on the floor as soon as it happens. Remove soap buildup in the tub or shower regularly. Attach bath mats securely with double-sided non-slip rug tape. Do not have throw rugs and other things on the floor that can make you trip. What can I do in the bedroom? Use night lights. Make sure that you have a light by your bed that is easy to reach. Do not use any sheets or blankets that are too big for your bed. They should not hang down onto the floor. Have a  firm chair that has side arms. You can use this for support while you get dressed. Do not have throw rugs and other things on the floor that can make you trip. What can I do in the kitchen? Clean up any spills right away. Avoid walking on wet floors. Keep items that you use a lot in easy-to-reach places. If you need to reach something above you, use a strong step stool that has a grab bar. Keep electrical cords out of the way. Do not use floor polish or wax that makes floors slippery. If you must use wax, use non-skid floor wax. Do not have throw rugs and other things on the floor that can make you trip. What can I do with my stairs? Do not leave any items on the stairs. Make sure that there are handrails on both sides of the stairs and use them. Fix handrails that are broken or loose. Make sure that handrails are as long as the stairways. Check any carpeting to make sure that it is firmly attached to the stairs. Fix any carpet that is loose or worn. Avoid having throw rugs at the top or bottom of the stairs. If you do have throw rugs, attach them to the floor with carpet tape. Make sure that you have a light switch at the  top of the stairs and the bottom of the stairs. If you do not have them, ask someone to add them for you. What else can I do to help prevent falls? Wear shoes that: Do not have high heels. Have rubber bottoms. Are comfortable and fit you well. Are closed at the toe. Do not wear sandals. If you use a stepladder: Make sure that it is fully opened. Do not climb a closed stepladder. Make sure that both sides of the stepladder are locked into place. Ask someone to hold it for you, if possible. Clearly mark and make sure that you can see: Any grab bars or handrails. First and last steps. Where the edge of each step is. Use tools that help you move around (mobility aids) if they are needed. These include: Canes. Walkers. Scooters. Crutches. Turn on the lights when you  go into a dark area. Replace any light bulbs as soon as they burn out. Set up your furniture so you have a clear path. Avoid moving your furniture around. If any of your floors are uneven, fix them. If there are any pets around you, be aware of where they are. Review your medicines with your doctor. Some medicines can make you feel dizzy. This can increase your chance of falling. Ask your doctor what other things that you can do to help prevent falls. This information is not intended to replace advice given to you by your health care provider. Make sure you discuss any questions you have with your health care provider. Document Released: 04/17/2009 Document Revised: 11/27/2015 Document Reviewed: 07/26/2014 Elsevier Interactive Patient Education  2017 [employer].

## 2022-10-07 NOTE — Progress Notes (Cosign Needed Addendum)
Subjective:   Jordan Mcconnell is a 67 y.o. male who presents for an Initial Medicare Annual Wellness Visit.  Patient medicare AWV questionnaire was completed by patient on 10-04-2022 I have confirmed all information answered by patient was correct and no changes .   I connected with  Jordan Mcconnell on 10/07/22 by a telephone enabled telemedicine application and verified that I am speaking with the correct person using two identifiers.   I discussed the limitations of evaluation and management by telemedicine. The patient expressed understanding and agreed to proceed.  Patient location: home/telephone    .   Review of Systems     Cardiac Risk Factors include: advanced age (>27men, >110 women);diabetes mellitus;male gender;hypertension;family history of premature cardiovascular disease;obesity (BMI >30kg/m2)     Objective:    Today's Vitals   There is no height or weight on file to calculate BMI.     10/07/2022   10:30 AM 07/06/2019   11:02 AM 04/16/2019   11:37 AM 04/11/2019   10:24 AM 07/13/2018    9:28 AM 08/24/2016    8:03 PM 08/06/2015    8:00 PM  Advanced Directives  Does Patient Have a Medical Advance Directive? Yes Yes Yes Yes Yes Yes No  Type of Academic librarian Living will [place];Living will [place];Living will [place]    Does patient want to make changes to medical advance directive?   No - Patient declined No - Patient declined  No - Patient declined   Copy of [place] in Chart? Yes - validated most recent copy scanned in chart (See row information)   No - copy requested No - copy requested    Would patient like information on creating a medical advance directive?       No - patient declined information    Current Medications (verified) Outpatient Encounter Medications as of 10/07/2022  Medication Sig   APPLE CIDER VINEGAR PO Take by mouth.    hydrochlorothiazide (HYDRODIURIL) 25 MG tablet TAKE 1 TABLET BY MOUTH EVERY DAY   metFORMIN (GLUCOPHAGE) 1000 MG tablet Take 1 tablet (1,000 mg total) by mouth 2 (two) times daily with a meal.   omeprazole (PRILOSEC) 40 MG capsule TAKE 1 CAPSULE (40 MG TOTAL) BY MOUTH DAILY.   No facility-administered encounter medications on file as of 10/07/2022.    Allergies (verified) Penicillins, Sulfa antibiotics, Codeine, and Tramadol   History: Past Medical History:  Diagnosis Date   Arthritis    Asthma    Complication of anesthesia    one surgery pt. 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   Obesity    OSA (obstructive sleep apnea)    uses CPAP   PONV (postoperative nausea and vomiting)    Past Surgical History:  Procedure Laterality Date   BACK SURGERY  08/2015   COLONOSCOPY     ESOPHAGEAL DILATION     1 or 2 times   ESOPHAGOGASTRODUODENOSCOPY N/A 10/30/2013   Procedure: ESOPHAGOGASTRODUODENOSCOPY (EGD);  Surgeon: Garlan Fair, MD;  Location: [facility];  Service: Endoscopy;  Laterality: N/A;   JOINT REPLACEMENT     bilateral knees   LEFT HEART CATH AND CORONARY ANGIOGRAPHY N/A 07/13/2018   Procedure: LEFT HEART CATH AND CORONARY ANGIOGRAPHY;  Surgeon:  Jettie Booze, MD;  Location: [facility];  Service: Cardiovascular;  Laterality: N/A;   SHOULDER ARTHROSCOPY     Bilateral 3 times each shoulder   TOTAL HIP ARTHROPLASTY Left 04/16/2019   Procedure: TOTAL HIP ARTHROPLASTY ANTERIOR APPROACH;  Surgeon: Gaynelle Arabian, MD;  Location: [facility];  Service: Orthopedics;  Laterality: Left;  131min   Family History  Problem Relation Age of Onset   Memory loss Mother    Heart murmur Mother    Colon cancer Father    Stroke Father    Cancer Father        colon   Dementia Father    Asthma Maternal Grandmother     Memory loss Maternal Grandmother    Cancer Maternal Grandfather        lung and bone   Asthma Paternal Grandmother    Social History   Socioeconomic History   Marital status: Married    Spouse name: Not on file   Number of children: Not on file   Years of education: Not on file   Highest education level: Not on file  Occupational History   Not on file  Tobacco Use   Smoking status: Never   Smokeless tobacco: Never  Vaping Use   Vaping Use: Never used  Substance and Sexual Activity   Alcohol use: No   Drug use: No   Sexual activity: Yes  Other Topics Concern   Not on file  Social History Narrative   Not on file   Social Determinants of Health   Financial Resource Strain: Low Risk  (10/04/2022)   Overall Financial Resource Strain (CARDIA)    Difficulty of Paying Living Expenses: Not hard at all  Food Insecurity: No Food Insecurity (10/07/2022)   Hunger Vital Sign    Worried About Running Out of Food in the Last Year: Never true    [place] in the Last Year: Never true  Transportation Needs: No Transportation Needs (10/04/2022)   PRAPARE - Hydrologist (Medical): No    Lack of Transportation (Non-Medical): No  Physical Activity: Insufficiently Active (10/04/2022)   Exercise Vital Sign    Days of Exercise per Week: 4 days    Minutes of Exercise per Session: 30 min  Stress: Stress Concern Present (10/07/2022)   [place]    Feeling of Stress : To some extent  Social Connections: Unknown (10/04/2022)   Social Connection and Isolation Panel [NHANES]    Frequency of Communication with Friends and Family: More than three times a week    Frequency of Social Gatherings with Friends and Family: Once a week    Attends Religious Services: Not on Advertising copywriter or Organizations: Yes    Attends Music therapist: More than 4 times per year    Marital Status:  Married    Tobacco Counseling Counseling given: Not Answered   Clinical Intake:  Pre-visit preparation completed: Yes  Pain : No/denies pain     Diabetes: Yes CBG done?: No Did pt. bring in CBG monitor from home?: No  How often do you need to have someone help you when you read instructions, pamphlets, or other written materials from your doctor or pharmacy?: 1 - Never  Diabetic?  Yes  Nutrition Risk Assessment:  Has the patient had any N/V/D within the last 2 months?  No  Does the patient have any non-healing  wounds?  No  Has the patient had any unintentional weight loss or weight gain?  No   Diabetes:  Is the patient diabetic?  Yes  If diabetic, was a CBG obtained today?  No  Did the patient bring in their glucometer from home?  No  How often do you monitor your CBG's? Only checks every at office visit.   Financial Strains and Diabetes Management:  Are you having any financial strains with the device, your supplies or your medication? No .  Does the patient want to be seen by Chronic Care Management for management of their diabetes?  No  Would the patient like to be referred to a Nutritionist or for Diabetic Management?  No   Diabetic Exams:  Diabetic Eye Exam: Completed.Pt has been advised about the importance in completing this exam. A referral has been placed today. Message sent to referral coordinator for scheduling purposes. Advised pt to expect a call from office referred to regarding appt.  Diabetic Foot Exam: Pt has been advised about the importance in completing this exam.   Interpreter Needed?: No  Information entered by :: Leroy Kennedy LPN   Activities of Daily Living    10/07/2022   10:31 AM 10/04/2022    8:43 AM  In your present state of health, do you have any difficulty performing the following activities:  Hearing? 1 0  Vision? 0 0  Difficulty concentrating or making decisions? 0 0  Walking or climbing stairs? 0 0  Dressing or bathing? 0 0   Doing errands, shopping? 0 0  Preparing Food and eating ? N N  Using the Toilet? N N  In the past six months, have you accidently leaked urine? N N  Do you have problems with loss of bowel control? N N  Managing your Medications? N N  Managing your Finances? N N  Housekeeping or managing your Housekeeping? N N    Patient Care Team: Midge Minium, MD as PCP - General (Family Medicine) Sueanne Margarita, MD as PCP - Cardiology (Cardiology) Sueanne Margarita, MD as Consulting Physician (Cardiology)  Indicate any recent Medical Services you may have received from other than [facility] providers in the past year (date may be approximate).     Assessment:   This is a routine wellness examination for Zaylin.  Hearing/Vision screen Hearing Screening - Comments:: Some trouble hearing Does not wear hearing aids Vision Screening - Comments:: Up to date [facility]  Dietary issues and exercise activities discussed: Current Exercise Habits: The patient does not participate in regular exercise at present   Goals Addressed             This Visit's Progress    Patient Stated       Spend more time outside        Depression Screen    10/07/2022   10:43 AM 09/13/2022    1:18 PM 06/10/2022    8:43 AM 04/29/2022    8:23 AM 01/27/2022    8:08 AM 10/28/2021    9:14 AM 07/15/2021    2:15 PM  PHQ 2/9 Scores  PHQ - 2 Score 2 2 1  0 2 1 1   PHQ- 9 Score  7 5 0 4 7 5     Fall Risk    10/07/2022   10:33 AM 10/04/2022    8:43 AM 09/13/2022    1:18 PM 06/10/2022    8:43 AM 04/29/2022    8:23 AM  Fall Risk   Falls in  the past year? 0 0 0 0 0  Number falls in past yr: 0  0    Injury with Fall? 0 0 0    Risk for fall due to :   No Fall Risks No Fall Risks No Fall Risks  Follow up Falls evaluation completed;Education provided;Falls prevention discussed  Falls evaluation completed Falls evaluation completed     FALL RISK PREVENTION PERTAINING TO THE HOME:  Any stairs in or around the home?  Yes  If so, are there any without handrails? No  Home free of loose throw rugs in walkways, pet beds, electrical cords, etc? Yes  Adequate lighting in your home to reduce risk of falls? Yes   ASSISTIVE DEVICES UTILIZED TO PREVENT FALLS:  Life alert? No  Use of a cane, walker or w/c? No  Grab bars in the bathroom? No  Shower chair or bench in shower? No  Elevated toilet seat or a handicapped toilet? No   TIMED UP AND GO:  Was the test performed? No .    Cognitive Function:        10/07/2022   10:32 AM  6CIT Screen  What Year? 0 points  What month? 0 points  What time? 0 points  Count back from 20 0 points  Months in reverse 0 points  Repeat phrase 0 points  Total Score 0 points    Immunizations Immunization History  Administered Date(s) Administered   Influenza Whole 05/18/2018   Influenza,inj,Quad PF,6+ Mos 04/27/2018, 03/26/2019, 04/19/2020   Influenza-Unspecified 04/19/2020, 04/03/2021, 04/08/2022   PFIZER(Purple Top)SARS-COV-2 Vaccination 10/09/2019, 10/30/2019, 05/17/2020, 04/08/2022   Pfizer Covid-19 Vaccine Bivalent Booster 71yrs & up 12/23/2020   Tdap 07/07/2016    TDAP status: Up to date  Flu Vaccine status: Up to date  Pneumococcal vaccine status: Due, Education has been provided regarding the importance of this vaccine. Advised may receive this vaccine at local pharmacy or Health Dept. Aware to provide a copy of the vaccination record if obtained from local pharmacy or Health Dept. Verbalized acceptance and understanding.  Covid-19 vaccine status: Information provided on how to obtain vaccines.   Qualifies for Shingles Vaccine? Yes   Zostavax completed No   Shingrix Completed?: No.    Education has been provided regarding the importance of this vaccine. Patient has been advised to call insurance company to determine out of pocket expense if they have not yet received this vaccine. Advised may also receive vaccine at local pharmacy or Health Dept.  Verbalized acceptance and understanding.  Screening Tests Health Maintenance  Topic Date Due   Diabetic kidney evaluation - Urine ACR  01/28/2023   FOOT EXAM  01/28/2023   OPHTHALMOLOGY EXAM  02/02/2023   INFLUENZA VACCINE  02/03/2023   HEMOGLOBIN A1C  03/16/2023   Diabetic kidney evaluation - eGFR measurement  09/13/2023   Medicare Annual Wellness (AWV)  10/07/2023   COLONOSCOPY (Pts 45-52yrs Insurance coverage will need to be confirmed)  02/26/2026   DTaP/Tdap/Td (2 - Td or Tdap) 07/07/2026   Hepatitis C Screening  Completed   HPV VACCINES  Aged Out   Pneumonia Vaccine 6+ Years old  Discontinued   COVID-19 Vaccine  Discontinued   Zoster Vaccines- Shingrix  Discontinued    Health Maintenance  There are no preventive care reminders to display for this patient.   Colorectal cancer screening: Type of screening: Colonoscopy. Completed 2017. Repeat every 10 years  Lung Cancer Screening: (Low Dose CT Chest recommended if Age 55-80 years, 30 pack-year currently smoking OR have  quit w/in 15years.) does not qualify.   Lung Cancer Screening Referral:   Additional Screening:  Hepatitis C Screening: does not qualify; Completed 2021  Vision Screening: Recommended annual ophthalmology exams for early detection of glaucoma and other disorders of the eye. Is the patient up to date with their annual eye exam?  Yes  Who is the provider or what is the name of the office in which the patient attends annual eye exams? [facility] If pt is not established with a provider, would they like to be referred to a provider to establish care? No .   Dental Screening: Recommended annual dental exams for proper oral hygiene  Community Resource Referral / Chronic Care Management: CRR required this visit?  No   CCM required this visit?  No      Plan:     I have personally reviewed and noted the following in the patient's chart:   Medical and social history Use of alcohol, tobacco or  illicit drugs  Current medications and supplements including opioid prescriptions. Patient is not currently taking opioid prescriptions. Functional ability and status Nutritional status Physical activity Advanced directives List of other physicians Hospitalizations, surgeries, and ER visits in previous 12 months Vitals Screenings to include cognitive, depression, and falls Referrals and appointments  In addition, I have reviewed and discussed with patient certain preventive protocols, quality metrics, and best practice recommendations. A written personalized care plan for preventive services as well as general preventive health recommendations were provided to patient.     Leroy Kennedy, LPN   [id number]   Nurse Notes:

## 2022-11-01 NOTE — Progress Notes (Unsigned)
Follow Up Note  RE: Jordan Mcconnell MRN: [id number] DOB: Sep 02, 1955 Date of Office Visit: 11/02/2022  Referring provider: Sheliah Hatch, MD Primary care provider: Sheliah Hatch, MD  Chief Complaint: No chief complaint on file.  History of Present Illness: I had the pleasure of seeing Jordan Mcconnell for a follow up visit at the [facility] on 11/01/2022. He is a 67 y.o. male, who is being followed for dyspnea, allergic rhinitis, dysphagia, GERD. His previous allergy office visit was on 04/01/2022 with Dr. Selena Batten. Today is a regular follow up visit.  Dyspnea Past history - Dyspnea with coughing and wheezing for 10 years.  Lately has been having daily symptoms and recently started using albuterol with good benefit.  Patient had COVID-19 and was followed by pulmonology in the past.  2021 CT chest showed groundglass opacities and small unchanged bilateral lung nodules. Wears CPAP and has reflux. Negative cardiac work up in the past. Interim history - no improvement with Symbicort/albuterol. Usually has DOE and symptoms resolve within 5-6 minutes of stopping activity. Today's spirometry showed some restriction most likely due to body habitus. Discussed with patient at length that his dyspnea is unlikely to be related to reactive airway as symptoms resolve fairly quickly without rescue inhaler use. More concerning for physical deconditioning and/or cardiac. May need revisit with cardiology as cardiac work up was done in 3 years ago. Stop inhalers. If you notice worsening symptoms restart Symbicort and let Jordan Mcconnell know.    Seasonal and perennial allergic rhinitis Past history - 2022 skin testing was positive to grass pollen, ragweed pollen, weed pollen, mold, dust mites, and cockroach.  Interim history - doing better.  Continue environmental control measures. Continue Singulair (montelukast) 10mg  daily at night. Use over the counter antihistamines such as Zyrtec  (cetirizine), Claritin (loratadine), Allegra (fexofenadine), or Xyzal (levocetirizine) daily as needed. May switch antihistamines every few months.   Dysphagia Noted dysphagia and mentions dilatations in the past. He takes PPI daily for GERD. Does not recall being diagnosed with eoe. Recommend to follow up with GI to rule out EoE.   Gastroesophageal reflux disease Continue dietary and lifestyle modifications.  Continue omeprazole 40 mg once a day to prevent reflux.   Return in about 6 months (around 09/30/2022).   Make sure you wear CPAP every night.  Follow up with PCP in October 2023 as scheduled. Recommend Covid-19 booster and flu vaccine.  Check with PCP regarding pneumonia injections.   Assessment and Plan: [name] is a 67 y.o. male with: No problem-specific Assessment & Plan notes found for this encounter.  No follow-ups on file.  No orders of the defined types were placed in this encounter.  Lab Orders  No laboratory test(s) ordered today    Diagnostics: Spirometry:  Tracings reviewed. His effort: {Blank single:19197::"Good reproducible efforts.","It was hard to get consistent efforts and there is a question as to whether this reflects a maximal maneuver.","Poor effort, data can not be interpreted."} FVC: ***L FEV1: ***L, ***% predicted FEV1/FVC ratio: ***% Interpretation: {Blank single:19197::"Spirometry consistent with mild obstructive disease","Spirometry consistent with moderate obstructive disease","Spirometry consistent with severe obstructive disease","Spirometry consistent with possible restrictive disease","Spirometry consistent with mixed obstructive and restrictive disease","Spirometry uninterpretable due to technique","Spirometry consistent with normal pattern","No overt abnormalities noted given today's efforts"}.  Please see scanned spirometry results for details.  Skin Testing: {Blank single:19197::"Select foods","Environmental allergy panel","Environmental  allergy panel and select foods","Food allergy panel","None","Deferred due to recent antihistamines use"}. *** Results discussed with patient/family.  Medication List:  Current Outpatient Medications  Medication Sig Dispense Refill  . APPLE CIDER VINEGAR PO Take by mouth.    . hydrochlorothiazide (HYDRODIURIL) 25 MG tablet TAKE 1 TABLET BY MOUTH EVERY DAY 90 tablet 1  . metFORMIN (GLUCOPHAGE) 1000 MG tablet Take 1 tablet (1,000 mg total) by mouth 2 (two) times daily with a meal. 180 tablet 3  . omeprazole (PRILOSEC) 40 MG capsule TAKE 1 CAPSULE (40 MG TOTAL) BY MOUTH DAILY. 90 capsule 0   No current facility-administered medications for this visit.   Allergies: Allergies  Allergen Reactions  . Penicillins Rash    DID THE REACTION INVOLVE: Swelling of the face/tongue/throat, SOB, or low BP? No Sudden or severe rash/hives, skin peeling, or the inside of the mouth or nose? Yes Did it require medical treatment? Yes When did it last happen?      childhood allergy If all above answers are "NO", may proceed with cephalosporin use.    . Sulfa Antibiotics Rash  . Codeine Nausea Only  . Tramadol Nausea Only    Severe dizziness   I reviewed his past medical history, social history, family history, and environmental history and no significant changes have been reported from his previous visit.  Review of Systems  Constitutional:  Negative for appetite change, chills, fever and unexpected weight change.  HENT:  Negative for congestion, postnasal drip and rhinorrhea.   Eyes:  Negative for itching.  Respiratory:  Positive for cough and shortness of breath. Negative for chest tightness and wheezing.   Cardiovascular:  Negative for chest pain.  Gastrointestinal:  Negative for abdominal pain.  Genitourinary:  Negative for difficulty urinating.  Skin:  Negative for rash.  Allergic/Immunologic: Positive for environmental allergies.   Objective: There were no vitals taken for this  visit. There is no height or weight on file to calculate BMI. Physical Exam Vitals and nursing note reviewed.  Constitutional:      Appearance: Normal appearance. He is well-developed. He is obese.  HENT:     Head: Normocephalic and atraumatic.     Right Ear: Tympanic membrane and external ear normal.     Left Ear: Tympanic membrane and external ear normal.     Nose: Nose normal.     Mouth/Throat:     Mouth: Mucous membranes are moist.     Pharynx: Oropharynx is clear.  Eyes:     Conjunctiva/sclera: Conjunctivae normal.  Cardiovascular:     Rate and Rhythm: Normal rate and regular rhythm.     Heart sounds: Normal heart sounds. No murmur heard.    No friction rub. No gallop.  Pulmonary:     Effort: Pulmonary effort is normal.     Breath sounds: Normal breath sounds. No wheezing, rhonchi or rales.  Musculoskeletal:     Cervical back: Neck supple.  Skin:    General: Skin is warm.     Findings: No rash.  Neurological:     Mental Status: He is alert and oriented to person, place, and time.  Psychiatric:        Behavior: Behavior normal.  Previous notes and tests were reviewed. The plan was reviewed with the patient/family, and all questions/concerned were addressed.  It was my pleasure to see Jordan Mcconnell today and participate in his care. Please feel free to contact me with any questions or concerns.  Sincerely,  Wyline Mood, DO Allergy & Immunology  [facility] office: [phone] [facility] office: [phone]

## 2022-11-02 ENCOUNTER — Telehealth: Payer: Self-pay

## 2022-11-02 ENCOUNTER — Other Ambulatory Visit: Payer: Self-pay

## 2022-11-02 ENCOUNTER — Encounter: Payer: Self-pay | Admitting: Allergy

## 2022-11-02 ENCOUNTER — Ambulatory Visit (INDEPENDENT_AMBULATORY_CARE_PROVIDER_SITE_OTHER): Payer: PPO | Admitting: Allergy

## 2022-11-02 VITALS — BP 138/82 | HR 67 | Temp 98.6°F | Resp 18 | Ht 69.0 in | Wt 275.8 lb

## 2022-11-02 DIAGNOSIS — R0609 Other forms of dyspnea: Secondary | ICD-10-CM

## 2022-11-02 DIAGNOSIS — K219 Gastro-esophageal reflux disease without esophagitis: Secondary | ICD-10-CM | POA: Diagnosis not present

## 2022-11-02 DIAGNOSIS — J3089 Other allergic rhinitis: Secondary | ICD-10-CM

## 2022-11-02 DIAGNOSIS — J302 Other seasonal allergic rhinitis: Secondary | ICD-10-CM | POA: Diagnosis not present

## 2022-11-02 DIAGNOSIS — R1310 Dysphagia, unspecified: Secondary | ICD-10-CM

## 2022-11-02 NOTE — Assessment & Plan Note (Signed)
.   Continue dietary and lifestyle modifications.  . Continue omeprazole 40 mg once a day to prevent reflux. 

## 2022-11-02 NOTE — Assessment & Plan Note (Signed)
Past history - Dyspnea with coughing and wheezing for 10 years.  Lately has been having daily symptoms and recently started using albuterol with good benefit.  Patient had COVID-19 and was followed by pulmonology in the past.  2021 CT chest showed groundglass opacities and small unchanged bilateral lung nodules. Wears CPAP and has reflux. Negative cardiac work up in the past. Interim history - no worsening symptoms with stopping inhalers. Still has symptoms with minimal exertion.  Today's spirometry showed mixed obstruction/restriction but had questionable effort. There was 53% improvement in FEV1 post bronchodilator treatment. Clinically feeling unchanged. Improvement may have been due to better effort post treatment.  Will refer to cardiology to rule out cardiac issues. If negative work up, will get CT chest next.

## 2022-11-02 NOTE — Assessment & Plan Note (Signed)
Past history - 2022 skin testing was positive to grass pollen, ragweed pollen, weed pollen, mold, dust mites, and cockroach.  Interim history - PND in the am.  Continue environmental control measures. Continue Singulair (montelukast) 10mg  daily at night. Use over the counter antihistamines such as Zyrtec (cetirizine), Claritin (loratadine), Allegra (fexofenadine), or Xyzal (levocetirizine) daily as needed. May switch antihistamines every few months. Declined nasal spray.

## 2022-11-02 NOTE — Assessment & Plan Note (Signed)
Past history - dysphagia and mentions dilatations in the past. He takes PPI daily for GERD. Does not recall being diagnosed with eoe. Interim history - unchanged.  Refer to GI for dysphagia.

## 2022-11-02 NOTE — Telephone Encounter (Signed)
Per Dr.Kim please refer to Cardiology to rule out cardiac issues and also please refer to GI for dysphagia.

## 2022-11-02 NOTE — Patient Instructions (Addendum)
Shortness of breath Will refer to cardiology to rule out cardiac issues. If negative work up, will get CT chest next.   Allergic rhinitis 2022 skin testing was positive to grass pollen, ragweed pollen, weed pollen, mold, dust mites, and cockroach.  Continue environmental control measures. Continue Singulair (montelukast) 10mg  daily at night. Use over the counter antihistamines such as Zyrtec (cetirizine), Claritin (loratadine), Allegra (fexofenadine), or Xyzal (levocetirizine) daily as needed. May switch antihistamines every few months.  Reflux Continue dietary and lifestyle modifications.  Continue omeprazole 40 mg once a day to prevent reflux. Refer to GI for dysphagia.   Make sure you wear CPAP every night.   Follow up in 3 months or sooner if needed.

## 2022-11-24 ENCOUNTER — Ambulatory Visit: Payer: PPO | Admitting: Physician Assistant

## 2022-11-24 ENCOUNTER — Other Ambulatory Visit: Payer: Self-pay | Admitting: Physician Assistant

## 2022-11-24 ENCOUNTER — Encounter: Payer: Self-pay | Admitting: Physician Assistant

## 2022-11-24 VITALS — BP 130/82 | HR 71 | Wt 264.0 lb

## 2022-11-24 DIAGNOSIS — R1310 Dysphagia, unspecified: Secondary | ICD-10-CM

## 2022-11-24 DIAGNOSIS — R195 Other fecal abnormalities: Secondary | ICD-10-CM

## 2022-11-24 DIAGNOSIS — Z800 Family history of malignant neoplasm of digestive organs: Secondary | ICD-10-CM

## 2022-11-24 DIAGNOSIS — K219 Gastro-esophageal reflux disease without esophagitis: Secondary | ICD-10-CM

## 2022-11-24 MED ORDER — DICYCLOMINE HCL 10 MG PO CAPS: ORAL_CAPSULE | ORAL | 6 refills | Status: DC

## 2022-11-24 MED ORDER — NA SULFATE-K SULFATE-MG SULF 17.5-3.13-1.6 GM/177ML PO SOLN: 1.0000 | ORAL | 0 refills | Status: DC

## 2022-11-24 NOTE — Patient Instructions (Signed)
_______________________________________________________  If your blood pressure at your visit was 140/90 or greater, please contact your primary care physician to follow up on this. _______________________________________________________  If you are age 66 or older, your body mass index should be between 23-30. Your Body mass index is 38.99 kg/m. If this is out of the aforementioned range listed, please consider follow up with your Primary Care Provider. ________________________________________________________  The [facility] providers would like to encourage you to use [facility] to communicate with providers for non-urgent requests or questions.  Due to long hold times on the telephone, sending your provider a message by [facility] may be a faster and more efficient way to get a response.  Please allow 48 business hours for a response.  Please remember that this is for non-urgent requests.  _______________________________________________________ CONTINUE: Omeprazole 40mg  one capsule every morning prior to breakfast meal each day.  START: Bentyl 10mg  one capsule by mouth 30 to 45 minutes before meals as needed for urgency.  Call back and speak to Dr Marvell Fuller nurse Jeannett Senior) about cardiac work up.   You have been scheduled for an endoscopy and colonoscopy. Please follow the written instructions given to you at your visit today. Please pick up your prep supplies at the pharmacy within the next 1-3 days. If you use inhalers (even only as needed), please bring them with you on the day of your procedure.  Due to recent changes in healthcare laws, you may see the results of your imaging and laboratory studies on MyChart before your provider has had a chance to review them.  We understand that in some cases there may be results that are confusing or concerning to you. Not all laboratory results come back in the same time frame and the provider may be waiting for multiple results in order to interpret  others.  Please give [name] 48 hours in order for your provider to thoroughly review all the results before contacting the office for clarification of your results.   Thank you for entrusting me with your care and choosing [facility].  Amy Esterwood, PA-C

## 2022-11-24 NOTE — Progress Notes (Addendum)
Subjective:    Patient ID: Jordan Mcconnell, male    DOB: Feb 14, 1956, 67 y.o.   MRN: [id number]  HPI Jordan Mcconnell is a pleasant 67 year old white male, established with Dr. Leone Payor.  He was last seen in the office in 2017.  He comes in today with new complaint of dysphagia which has been present for several months at this point.  He also has chronic issues with dyspnea which  is under investigation.  This has been present over the past few years.  He has had some prior pulmonary evaluation with no definite diagnosis and currently has an appointment next week with cardiology to rule out any cardiac issues.  He did have a cardiac cath a couple of years ago with nonobstructive coronary disease, and normal EF. Regarding his dysphagia, he does not have any difficulty with liquids, generally has trouble with solid food on a regular basis.  He is not having episodes requiring regurgitation but frequently has to stop eating to allow food to eventually traverse his esophagus.  He is on omeprazole 40 mg daily which controls heartburn symptoms. He also mentions that he has ongoing problems with loose stools over the past several years as well.  He generally has urgency postprandially resulting in a loose bowel movement.  This is sometimes problematic because he has times when he does not want to leave the house etc. He did undergo colonoscopy in 2017 last and has family history of colon cancer in his mother.  He was noted to have a somewhat nodular terminal ileum, sigmoid diverticulosis and no polyps at that time.  Biopsy from the terminal ileum showed reactive nodular lymphoid hyperplasia no active inflammation and random biopsies of colon were negative for microscopic colitis he did have some reactive lymphoid hyperplasia. He has not had prior EGD here that he believes he had remote EGD and colonoscopy possibly with Dr. Barnett Abu many years ago and had his esophagus dilated. Other medical issues include hypertension, sleep  apnea with CPAP use no oxygen use, adult onset diabetes mellitus, obesity with BMI 38 osteoarthritis.  Review of Systems Pertinent positive and negative review of systems were noted in the above HPI section.  All other review of systems was otherwise negative.   Outpatient Encounter Medications as of 11/24/2022  Medication Sig   APPLE CIDER VINEGAR PO Take by mouth.   dicyclomine (BENTYL) 10 MG capsule Take one capsule by mouth 30 to 45 minutes before meals as needed for urgency   hydrochlorothiazide (HYDRODIURIL) 25 MG tablet TAKE 1 TABLET BY MOUTH EVERY DAY   metFORMIN (GLUCOPHAGE) 1000 MG tablet Take 1 tablet (1,000 mg total) by mouth 2 (two) times daily with a meal.   montelukast (SINGULAIR) 10 MG tablet Take 10 mg by mouth at bedtime.   Na Sulfate-K Sulfate-Mg Sulf (SUPREP BOWEL PREP KIT) 17.5-3.13-1.6 GM/177ML SOLN Take 1 kit by mouth as directed.   omeprazole (PRILOSEC) 40 MG capsule TAKE 1 CAPSULE (40 MG TOTAL) BY MOUTH DAILY.   No facility-administered encounter medications on file as of 11/24/2022.   Allergies  Allergen Reactions   Penicillins Rash    DID THE REACTION INVOLVE: Swelling of the face/tongue/throat, SOB, or low BP? No Sudden or severe rash/hives, skin peeling, or the inside of the mouth or nose? Yes Did it require medical treatment? Yes When did it last happen?      childhood allergy If all above answers are "NO", may proceed with cephalosporin use.     Sulfa Antibiotics Rash  Codeine Nausea Only   Tramadol Nausea Only    Severe dizziness   Patient Active Problem List   Diagnosis Date Noted   Hyperlipidemia associated with type 2 diabetes mellitus (HCC) 01/27/2022   Insomnia 10/28/2021   Seasonal and perennial allergic rhinitis 06/18/2021   Multiple drug allergies 06/03/2021   Chronic rhinitis 06/03/2021   Erectile dysfunction 04/01/2021   OA (osteoarthritis) of hip 04/16/2019   Dysphagia 09/18/2018   Lung nodules 09/18/2018   Chest pain 07/11/2018    Dyspnea 06/21/2018   Physical exam 07/22/2017   Adjustment disorder 03/17/2017   Diabetes mellitus, type II (HCC) 07/29/2016   Chronic cough 07/07/2016   Irritable bowel syndrome with diarrhea 05/12/2016   Gastroesophageal reflux disease 05/12/2016   Back pain 08/06/2015   Obstructive sleep apnea    HTN (hypertension)    Morbid obesity (HCC)    Social History   Socioeconomic History   Marital status: Married    Spouse name: Not on file   Number of children: 2   Years of education: Not on file   Highest education level: Not on file  Occupational History   Occupation: retired  Tobacco Use   Smoking status: Never   Smokeless tobacco: Never  Vaping Use   Vaping Use: Never used  Substance and Sexual Activity   Alcohol use: No   Drug use: No   Sexual activity: Yes  Other Topics Concern   Not on file  Social History Narrative   Not on file   Social Determinants of Health   Financial Resource Strain: Low Risk  (10/04/2022)   Overall Financial Resource Strain (CARDIA)    Difficulty of Paying Living Expenses: Not hard at all  Food Insecurity: No Food Insecurity (10/07/2022)   Hunger Vital Sign    Worried About Radiation protection practitioner of Food in the Last Year: Never true    Ran Out of Food in the Last Year: Never true  Transportation Needs: No Transportation Needs (10/04/2022)   PRAPARE - Administrator, Civil Service (Medical): No    Lack of Transportation (Non-Medical): No  Physical Activity: Insufficiently Active (10/04/2022)   Exercise Vital Sign    Days of Exercise per Week: 4 days    Minutes of Exercise per Session: 30 min  Stress: Stress Concern Present (10/07/2022)   [employer] of Occupational Health - Occupational Stress Questionnaire    Feeling of Stress : To some extent  Social Connections: Unknown (10/04/2022)   Social Connection and Isolation Panel [NHANES]    Frequency of Communication with Friends and Family: More than three times a week    Frequency of Social  Gatherings with Friends and Family: Once a week    Attends Religious Services: Not on Marketing executive or Organizations: Yes    Attends Banker Meetings: More than 4 times per year    Marital Status: Married  Catering manager Violence: Not At Risk (10/07/2022)   Humiliation, Afraid, Rape, and Kick questionnaire    Fear of Current or Ex-Partner: No    Emotionally Abused: No    Physically Abused: No    Sexually Abused: No    Mr. Marchant family history includes Asthma in his maternal grandmother and paternal grandmother; Cancer in his father and maternal grandfather; Colon cancer in his father; Dementia in his father; Heart murmur in his mother; Memory loss in his maternal grandmother and mother; Stroke in his father.      Objective:  Vitals:   11/24/22 0823  BP: 130/82  Pulse: 71    Physical Exam Well-developed well-nourished older white male in no acute distress.,  Accompanied by spouse height, Weight, 264 BMI 38.99  HEENT; nontraumatic normocephalic, EOMI, PE R LA, sclera anicteric. Oropharynx; not examined today Neck; supple, no JVD Cardiovascular; regular rate and rhythm with S1-S2, no murmur rub or gallop Pulmonary; Clear bilaterally Abdomen; soft, obese, nontender, long segment of diastases recti from the epigastrium to the umbilicus, nondistended, no palpable mass or hepatosplenomegaly, bowel sounds are active Rectal; not done today Skin; benign exam, no jaundice rash or appreciable lesions Extremities; no clubbing cyanosis or edema skin warm and dry Neuro/Psych; alert and oriented x4, grossly nonfocal mood and affect appropriate        Assessment & Plan:   #44  67 year old white male with multiple month history of solid food dysphagia with very frequent episodes of food stopping in his esophagus requiring him to stop eating, no episodes requiring regurgitation.  No difficulty with liquids.  This is in the setting of chronic GERD  history Suspect reflux related esophageal stricture, rule out esophageal ring, rule out dysmotility  #2 family history of colon cancer in patient's mother-patient is overdue for colonoscopy last done in 2017.  No polyps found at that time.  #3.  Chronic postprandial urgency and loose stool-previous random biopsies negative for microscopic colitis though he did have some reactive lymphoid changes.  This may be secondary to IBS. He is on metformin but says he has not ever noted any changes in loose stool with increased doses of metformin.  #4.  Chronic dyspnea present over the past couple of years-he says he is never returned to baseline after having COVID in 2021. Dyspnea is exertional Cardiac appointment pending next week  #5 hypertension 6.  Sleep apnea with CPAP use 7.  Obesity BMI 38 8.  Adult onset diabetes mellitus 10.  Osteoarthritis  Plan; Continue omeprazole 40 mg p.o. every morning AC breakfast Will start a trial of Bentyl 10 mg 3 times daily 30 to 45 minutes AC as needed. We discussed timing of Colonoscopy and EGD with dilation.  Since he has an appointment with cardiology for further evaluation of his ongoing dyspnea I think it is prudent to wait for cardiac evaluation prior to sedation.  We will intentionally schedule him for procedures with Dr. Leone Payor in mid to late July.  I have also asked them to call back and speak to my nurse after he has had cardiac workup so that we can review results for appropriateness of sedation.  They voiced understanding.  Jordan Mcconnell Oswald Hillock PA-C 11/24/2022   Cc: Sheliah Hatch, MD

## 2022-11-28 ENCOUNTER — Other Ambulatory Visit: Payer: Self-pay | Admitting: Family Medicine

## 2022-12-12 NOTE — Progress Notes (Unsigned)
Cardiology Office Note:   Date:  12/12/2022  NAME:  Jordan Mcconnell    MRN: [id number] DOB:  09-28-1955   PCP:  Sheliah Hatch, MD  Cardiologist:  Armanda Magic, MD  Electrophysiologist:  None   Referring MD: Ellamae Sia, DO   No chief complaint on file. ***  History of Present Illness:   Jordan Mcconnell is a 67 y.o. male with a hx of obesity, obstructive lung disease, obesity, non-obstructive CAD who is being seen today for the evaluation of SOB at the request of Wyline Mood M, DO.  Cr 0.73 T chol 141, HDL 36, LDL 71, TG 174  PFT -> Moderate airway obstruction/restriction with improvement post-dilator   Problem List DM Obesity (BMI 40) HTN OSA Non-obstructive CAD -LHC 2020 25% LAD 6. DM -A1c 6.8  Past Medical History: Past Medical History:  Diagnosis Date   Arthritis    Asthma    Complication of anesthesia    one surgery pt. states he was hard to wake up   Diabetes mellitus without complication (HCC)    type 2   GERD (gastroesophageal reflux disease)    HTN (hypertension)    Irritable bowel syndrome with diarrhea 05/12/2016   Obesity    OSA (obstructive sleep apnea)    uses CPAP   PONV (postoperative nausea and vomiting)     Past Surgical History: Past Surgical History:  Procedure Laterality Date   BACK SURGERY  08/2015   COLONOSCOPY     ESOPHAGEAL DILATION     1 or 2 times   ESOPHAGOGASTRODUODENOSCOPY N/A 10/30/2013   Procedure: ESOPHAGOGASTRODUODENOSCOPY (EGD);  Surgeon: Charolett Bumpers, MD;  Location: [facility];  Service: Endoscopy;  Laterality: N/A;   JOINT REPLACEMENT     bilateral knees   LEFT HEART CATH AND CORONARY ANGIOGRAPHY N/A 07/13/2018   Procedure: LEFT HEART CATH AND CORONARY ANGIOGRAPHY;  Surgeon: Corky Crafts, MD;  Location: [facility];  Service: Cardiovascular;  Laterality: N/A;   SHOULDER ARTHROSCOPY     Bilateral 3 times each shoulder   TOTAL HIP ARTHROPLASTY Left 04/16/2019   Procedure: TOTAL HIP ARTHROPLASTY  ANTERIOR APPROACH;  Surgeon: Ollen Gross, MD;  Location: [facility];  Service: Orthopedics;  Laterality: Left;     Current Medications: No outpatient medications have been marked as taking for the 12/15/22 encounter (Appointment) with O'Neal, Ronnald Ramp, MD.     Allergies:    Penicillins, Sulfa antibiotics, Codeine, and Tramadol   Social History: Social History   Socioeconomic History   Marital status: Married    Spouse name: Not on file   Number of children: 2   Years of education: Not on file   Highest education level: Not on file  Occupational History   Occupation: retired  Tobacco Use   Smoking status: Never   Smokeless tobacco: Never  Vaping Use   Vaping Use: Never used  Substance and Sexual Activity   Alcohol use: No   Drug use: No   Sexual activity: Yes  Other Topics Concern   Not on file  Social History Narrative   Not on file   Social Determinants of Health   Financial Resource Strain: Low Risk  (10/04/2022)   Overall Financial Resource Strain (CARDIA)    Difficulty of Paying Living Expenses: Not hard at all  Food Insecurity: No Food Insecurity (10/07/2022)   Hunger Vital Sign    Worried About Running Out of Food in the Last Year: Never true  Ran Out of Food in the Last Year: Never true  Transportation Needs: No Transportation Needs (10/04/2022)   PRAPARE - Administrator, Civil Service (Medical): No    Lack of Transportation (Non-Medical): No  Physical Activity: Insufficiently Active (10/04/2022)   Exercise Vital Sign    Days of Exercise per Week: 4 days    Minutes of Exercise per Session: 30 min  Stress: Stress Concern Present (10/07/2022)   [employer] of Occupational Health - Occupational Stress Questionnaire    Feeling of Stress : To some extent  Social Connections: Unknown (10/04/2022)   Social Connection and Isolation Panel [NHANES]    Frequency of Communication with Friends and Family: More than three times a week    Frequency  of Social Gatherings with Friends and Family: Once a week    Attends Religious Services: Not on Marketing executive or Organizations: Yes    Attends Engineer, structural: More than 4 times per year    Marital Status: Married     Family History: The patient's ***family history includes Asthma in his maternal grandmother and paternal grandmother; Cancer in his father and maternal grandfather; Colon cancer in his father; Dementia in his father; Heart murmur in his mother; Memory loss in his maternal grandmother and mother; Stroke in his father. There is no history of Liver disease or Esophageal cancer.  ROS:   All other ROS reviewed and negative. Pertinent positives noted in the HPI.     EKGs/Labs/Other Studies Reviewed:   The following studies were personally reviewed by me today:  EKG:  EKG is *** ordered today.  The ekg ordered today demonstrates ***, and was personally reviewed by me.   TTE 08/10/2018  1. The left ventricle has normal systolic function of 55-60%. The cavity  size was normal. There is no increased left ventricular wall thickness.  Echo evidence of impaired diastolic relaxation and indeterminate left  ventricular filling pressure.   2. The right ventricle has normal systolic function. The cavity was  normal. There is no increase in right ventricular wall thickness.   3. The mitral valve is normal in structure.   4. The tricuspid valve is normal in structure.   5. The aortic valve is normal in structure.   6. The pulmonic valve was normal in structure.   Recent Labs: 09/13/2022: ALT 53; BUN 14; Creatinine, Ser 0.73; Hemoglobin 15.2; Platelets 181.0; Potassium 4.5; Sodium 136; TSH 2.41   Recent Lipid Panel    Component Value Date/Time   CHOL 141 09/13/2022 1349   TRIG 174.0 (H) 09/13/2022 1349   HDL 35.60 (L) 09/13/2022 1349   CHOLHDL 4 09/13/2022 1349   VLDL 34.8 09/13/2022 1349   LDLCALC 71 09/13/2022 1349   LDLDIRECT 97.0 12/24/2020 1430     Physical Exam:   VS:  There were no vitals taken for this visit.   Wt Readings from Last 3 Encounters:  11/24/22 264 lb (119.7 kg)  11/02/22 275 lb 12 oz (125.1 kg)  09/13/22 273 lb 6 oz (124 kg)    General: Well nourished, well developed, in no acute distress Head: Atraumatic, normal size  Eyes: PEERLA, EOMI  Neck: Supple, no JVD Endocrine: No thryomegaly Cardiac: Normal S1, S2; RRR; no murmurs, rubs, or gallops Lungs: Clear to auscultation bilaterally, no wheezing, rhonchi or rales  Abd: Soft, nontender, no hepatomegaly  Ext: No edema, pulses 2+ Musculoskeletal: No deformities, BUE and BLE strength normal and equal Skin: Warm  and dry, no rashes   Neuro: Alert and oriented to person, place, time, and situation, CNII-XII grossly intact, no focal deficits  Psych: Normal mood and affect   ASSESSMENT:   NOBLE CICALESE is a 67 y.o. male who presents for the following: No diagnosis found.  PLAN:   There are no diagnoses linked to this encounter.  {Are you ordering a CV Procedure (e.g. stress test, cath, DCCV, TEE, etc)?   Press F2        :[id number]}  Disposition: No follow-ups on file.  Medication Adjustments/Labs and Tests Ordered: Current medicines are reviewed at length with the patient today.  Concerns regarding medicines are outlined above.  No orders of the defined types were placed in this encounter.  No orders of the defined types were placed in this encounter.   There are no Patient Instructions on file for this visit.   Time Spent with Patient: I have spent a total of *** minutes with patient reviewing hospital notes, telemetry, EKGs, labs and examining the patient as well as establishing an assessment and plan that was discussed with the patient.  > 50% of time was spent in direct patient care.  Signed, Lenna Gilford. Flora Lipps, MD, [facility]  [facility]  [address] [place], [place] [address] [phone]  12/12/2022 9:00 PM

## 2022-12-15 ENCOUNTER — Ambulatory Visit: Payer: PPO | Admitting: Family Medicine

## 2022-12-15 ENCOUNTER — Ambulatory Visit: Payer: PPO | Attending: Cardiovascular Disease | Admitting: Cardiovascular Disease

## 2022-12-15 ENCOUNTER — Encounter: Payer: Self-pay | Admitting: Cardiovascular Disease

## 2022-12-15 VITALS — BP 126/84 | HR 67 | Ht 71.0 in | Wt 279.0 lb

## 2022-12-15 DIAGNOSIS — E782 Mixed hyperlipidemia: Secondary | ICD-10-CM | POA: Diagnosis not present

## 2022-12-15 DIAGNOSIS — R0602 Shortness of breath: Secondary | ICD-10-CM | POA: Diagnosis not present

## 2022-12-15 DIAGNOSIS — I2510 Atherosclerotic heart disease of native coronary artery without angina pectoris: Secondary | ICD-10-CM | POA: Diagnosis not present

## 2022-12-15 DIAGNOSIS — R072 Precordial pain: Secondary | ICD-10-CM | POA: Diagnosis not present

## 2022-12-15 MED ORDER — METOPROLOL TARTRATE 100 MG PO TABS: ORAL_TABLET | ORAL | 0 refills | Status: DC

## 2022-12-15 NOTE — Patient Instructions (Addendum)
Medication Instructions:  Take Metoprolol 100 mg two hours before CT when scheduled.   *If you need a refill on your cardiac medications before your next appointment, please call your pharmacy*   Lab Work: BMET (have this drawn at your primary care office, take the order with you)   If you have labs (blood work) drawn today and your tests are completely normal, you will receive your results only by: MyChart Message (if you have MyChart) OR A paper copy in the mail If you have any lab test that is abnormal or we need to change your treatment, we will call you to review the results.   Testing/Procedures: Coronary CTA- they will call you to set up an appointment.  Echocardiogram - Your physician has requested that you have an echocardiogram. Echocardiography is a painless test that uses sound waves to create images of your heart. It provides your doctor with information about the size and shape of your heart and how well your heart's chambers and valves are working. This procedure takes approximately one hour. There are no restrictions for this procedure.    Follow-Up: At [facility], you and your health needs are our priority.  As part of our continuing mission to provide you with exceptional heart care, we have created designated Provider Care Teams.  These Care Teams include your primary Cardiologist (physician) and Advanced Practice Providers (APPs -  Physician Assistants and Nurse Practitioners) who all work together to provide you with the care you need, when you need it.  We recommend signing up for the patient portal called "MyChart".  Sign up information is provided on this After Visit Summary.  MyChart is used to connect with patients for Virtual Visits (Telemedicine).  Patients are able to view lab/test results, encounter notes, upcoming appointments, etc.  Non-urgent messages can be sent to your provider as well.   To learn more about what you can do with MyChart, go to  [email or web address].    Your next appointment:   As needed  Provider:   Lennie Odor, MD   Other Instructions  Referral to Pulmonology- they will contact you to set up an appointment.      Your cardiac CT will be scheduled at one of the below locations:   [facility] [address] [place], [place] [address] [phone]   If scheduled at [facility], please arrive at the [facility] and Children's Entrance (Entrance C2) of [facility] 30 minutes prior to test start time. You can use the FREE valet parking offered at entrance C (encouraged to control the heart rate for the test)  Proceed to the [facility] Radiology Department (first floor) to check-in and test prep.  All radiology patients and guests should use entrance C2 at [facility], accessed from [facility], even though the hospital's physical address listed is [address]      Please follow these instructions carefully (unless otherwise directed):  Hold all erectile dysfunction medications at least 3 days (72 hrs) prior to test. (Ie viagra, cialis, sildenafil, tadalafil, etc) We will administer nitroglycerin during this exam.   On the Night Before the Test: Be sure to Drink plenty of water. Do not consume any caffeinated/decaffeinated beverages or chocolate 12 hours prior to your test. Do not take any antihistamines 12 hours prior to your test.  On the Day of the Test: Drink plenty of water until 1 hour prior to the test. Do not eat any food  1 hour prior to test. You may take your regular medications prior to the test.  Take metoprolol (Lopressor) two hours prior to test. If you take Furosemide/Hydrochlorothiazide/Spironolactone, please HOLD on the morning of the test.     After the Test: Drink plenty of water. After receiving IV contrast, you may experience a mild flushed feeling. This is normal. On occasion, you may experience a mild rash up  to 24 hours after the test. This is not dangerous. If this occurs, you can take Benadryl 25 mg and increase your fluid intake. If you experience trouble breathing, this can be serious. If it is severe call 911 IMMEDIATELY. If it is mild, please call our office. If you take any of these medications: Glipizide/Metformin, Avandament, Glucavance, please do not take 48 hours after completing test unless otherwise instructed.  We will call to schedule your test 2-4 weeks out understanding that some insurance companies will need an authorization prior to the service being performed.   For non-scheduling related questions, please contact the cardiac imaging nurse navigator should you have any questions/concerns: Rockwell Alexandria, Cardiac Imaging Nurse Navigator Larey Brick, Cardiac Imaging Nurse Navigator [facility] Direct Office Dial: [phone]   For scheduling needs, including cancellations and rescheduling, please call [place], [phone].

## 2022-12-16 ENCOUNTER — Ambulatory Visit (INDEPENDENT_AMBULATORY_CARE_PROVIDER_SITE_OTHER): Payer: PPO | Admitting: Family Medicine

## 2022-12-16 ENCOUNTER — Encounter: Payer: Self-pay | Admitting: Family Medicine

## 2022-12-16 ENCOUNTER — Encounter (HOSPITAL_BASED_OUTPATIENT_CLINIC_OR_DEPARTMENT_OTHER): Payer: Self-pay

## 2022-12-16 VITALS — BP 120/70 | HR 74 | Wt 276.6 lb

## 2022-12-16 DIAGNOSIS — Z7984 Long term (current) use of oral hypoglycemic drugs: Secondary | ICD-10-CM

## 2022-12-16 DIAGNOSIS — E119 Type 2 diabetes mellitus without complications: Secondary | ICD-10-CM

## 2022-12-16 LAB — BASIC METABOLIC PANEL
BUN: 19 mg/dL (ref 6–23)
CO2: 26 mEq/L (ref 19–32)
Calcium: 9.4 mg/dL (ref 8.4–10.5)
Chloride: 99 mEq/L (ref 96–112)
Creatinine, Ser: 0.8 mg/dL (ref 0.40–1.50)
GFR: 92.31 mL/min (ref >60.00)
Glucose, Bld: 143 mg/dL — ABNORMAL HIGH (ref 70–99)
Potassium: 3.9 mEq/L (ref 3.5–5.1)
Sodium: 136 mEq/L (ref 135–145)

## 2022-12-16 LAB — MICROALBUMIN / CREATININE URINE RATIO
Creatinine,U: 186.9 mg/dL
Microalb Creat Ratio: 0.7 mg/g (ref 0.0–30.0)
Microalb, Ur: 1.3 mg/dL (ref 0.0–1.9)

## 2022-12-16 MED ORDER — METFORMIN HCL 1000 MG PO TABS: 1000.0000 mg | ORAL_TABLET | Freq: Two times a day (BID) | ORAL | 3 refills | Status: DC

## 2022-12-16 MED ORDER — OMEPRAZOLE 40 MG PO CPDR: 40.0000 mg | DELAYED_RELEASE_CAPSULE | Freq: Every day | ORAL | 0 refills | Status: DC

## 2022-12-16 MED ORDER — HYDROCHLOROTHIAZIDE 25 MG PO TABS: 25.0000 mg | ORAL_TABLET | Freq: Every day | ORAL | 1 refills | Status: DC

## 2022-12-16 NOTE — Patient Instructions (Signed)
Follow up in 3-4 months to recheck sugar, blood pressure, cholesterol We'll notify you of your lab results and make any changes if needed Continue to work on low carb/low sugar diet and get regular physical activity as you are able Call with any questions or concerns Stay Safe!  Stay Healthy! Hang in there!!!

## 2022-12-16 NOTE — Assessment & Plan Note (Signed)
Ongoing issue for pt.  Tolerating Metformin 1000mg  BID w/o difficulty.  Last A1C 6.8%.  Repeat microalbumin.  Foot exam done.  Check labs.  Adjust meds prn

## 2022-12-16 NOTE — Progress Notes (Signed)
   Subjective:    Patient ID: Jordan Mcconnell, male    DOB: 1956-05-18, 67 y.o.   MRN: [id number]  HPI DM- ongoing issue for pt.  Last A1C 6.8%  Currently on Metformin 1000mg  BID.  UTD on eye exam, foot exam, microalbumin but all are coming due next month.  Pt continues to have SOB.  Saw Cards yesterday and has ECHO pending.  Has had intermittent chest tightness- now seeing cards.  + HA's.  No visual changes.  No abd pain, N/V.  Random electric like shocks in feet.    Review of Systems For ROS see HPI     Objective:   Physical Exam Vitals reviewed.  Constitutional:      General: He is not in acute distress.    Appearance: Normal appearance. He is well-developed. He is obese. He is not ill-appearing.  HENT:     Head: Normocephalic and atraumatic.  Eyes:     Extraocular Movements: Extraocular movements intact.     Conjunctiva/sclera: Conjunctivae normal.     Pupils: Pupils are equal, round, and reactive to light.  Neck:     Thyroid: No thyromegaly.  Cardiovascular:     Rate and Rhythm: Normal rate and regular rhythm.     Pulses: Normal pulses.     Heart sounds: Normal heart sounds. No murmur heard. Pulmonary:     Effort: Pulmonary effort is normal. No respiratory distress.     Breath sounds: Normal breath sounds.  Abdominal:     General: Bowel sounds are normal. There is no distension.     Palpations: Abdomen is soft.  Musculoskeletal:     Cervical back: Normal range of motion and neck supple.     Right lower leg: No edema.     Left lower leg: No edema.  Lymphadenopathy:     Cervical: No cervical adenopathy.  Skin:    General: Skin is warm and dry.  Neurological:     General: No focal deficit present.     Mental Status: He is alert and oriented to person, place, and time.     Cranial Nerves: No cranial nerve deficit.  Psychiatric:        Mood and Affect: Mood normal.        Behavior: Behavior normal.           Assessment & Plan:

## 2022-12-21 ENCOUNTER — Telehealth (HOSPITAL_COMMUNITY): Payer: Self-pay | Admitting: Emergency Medicine

## 2022-12-21 NOTE — Telephone Encounter (Signed)
Reaching out to patient to offer assistance regarding upcoming cardiac imaging study; pt verbalizes understanding of appt date/time, parking situation and where to check in, pre-test NPO status and medications ordered, and verified current allergies; name and call back number provided for further questions should they arise Derian Dimalanta RN Navigator Cardiac Imaging [facility] [phone] office [phone] cell 

## 2022-12-22 ENCOUNTER — Encounter (HOSPITAL_COMMUNITY): Payer: Self-pay

## 2022-12-22 ENCOUNTER — Telehealth: Payer: Self-pay

## 2022-12-22 ENCOUNTER — Ambulatory Visit (HOSPITAL_COMMUNITY)
Admission: RE | Admit: 2022-12-22 | Discharge: 2022-12-22 | Disposition: A | Discharge status: Home / Self Care | Payer: PPO | Source: Ambulatory Visit | Attending: Cardiovascular Disease | Admitting: Cardiovascular Disease

## 2022-12-22 ENCOUNTER — Other Ambulatory Visit (INDEPENDENT_AMBULATORY_CARE_PROVIDER_SITE_OTHER): Payer: PPO

## 2022-12-22 DIAGNOSIS — R072 Precordial pain: Secondary | ICD-10-CM | POA: Diagnosis present

## 2022-12-22 DIAGNOSIS — E119 Type 2 diabetes mellitus without complications: Secondary | ICD-10-CM

## 2022-12-22 LAB — HEMOGLOBIN A1C: Hgb A1c MFr Bld: 7 % — ABNORMAL HIGH (ref 4.6–6.5)

## 2022-12-22 MED ORDER — NITROGLYCERIN 0.4 MG SL SUBL
SUBLINGUAL_TABLET | SUBLINGUAL | Status: AC
  Filled 2022-12-22: qty 2

## 2022-12-22 MED ORDER — NITROGLYCERIN 0.4 MG SL SUBL
0.8000 mg | SUBLINGUAL_TABLET | Freq: Once | SUBLINGUAL | Status: AC
  Administered 2022-12-22: 0.8 mg via SUBLINGUAL

## 2022-12-22 MED ORDER — IOHEXOL 350 MG/ML SOLN
95.0000 mL | Freq: Once | INTRAVENOUS | Status: AC | PRN
  Administered 2022-12-22: 95 mL via INTRAVENOUS

## 2022-12-22 NOTE — Telephone Encounter (Signed)
Noted.  Neither will impact his A1C

## 2022-12-22 NOTE — Telephone Encounter (Signed)
Called patient and discussed with him- he was happy we called back

## 2022-12-22 NOTE — Telephone Encounter (Signed)
Patient came in for his A1C to be drawn because it was missed at his last appt. He did have a CT done previously this morning and questioned whether the contrast would affect his results, I clarified with Dr. Beverely Low that he was ok to go ahead and have it drawn. He also wanted Dr. Beverely Low to know that he has eaten already about lunch time today.

## 2022-12-23 ENCOUNTER — Other Ambulatory Visit: Payer: Self-pay

## 2022-12-23 ENCOUNTER — Telehealth: Payer: Self-pay

## 2022-12-23 MED ORDER — ROSUVASTATIN CALCIUM 20 MG PO TABS: 20.0000 mg | ORAL_TABLET | Freq: Every day | ORAL | 3 refills | Status: DC

## 2022-12-23 NOTE — Telephone Encounter (Signed)
-----   Message from Sheliah Hatch, MD sent at 12/22/2022  5:10 PM EDT ----- A1C is up just slightly from 6.8 --> 7%  This will improve w/ low carb diet and regular exercise.  No med changes at this time

## 2022-12-27 ENCOUNTER — Ambulatory Visit (HOSPITAL_COMMUNITY): Payer: PPO | Attending: Cardiovascular Disease

## 2022-12-27 DIAGNOSIS — R072 Precordial pain: Secondary | ICD-10-CM | POA: Diagnosis present

## 2022-12-27 DIAGNOSIS — R0602 Shortness of breath: Secondary | ICD-10-CM | POA: Insufficient documentation

## 2022-12-27 LAB — ECHOCARDIOGRAM COMPLETE
Area-P 1/2: 4.21 cm2
S' Lateral: 3 cm

## 2022-12-27 MED ORDER — PERFLUTREN LIPID MICROSPHERE
1.0000 mL | INTRAVENOUS | Status: AC | PRN
Start: 2022-12-27 — End: 2022-12-27
  Administered 2022-12-27: 2 mL via INTRAVENOUS

## 2023-01-05 ENCOUNTER — Telehealth: Payer: Self-pay | Admitting: Internal Medicine

## 2023-01-05 NOTE — Telephone Encounter (Signed)
PT is calling to speak to Viviann Spare regarding his C and a heart clearance. Requesting callback

## 2023-01-07 NOTE — Telephone Encounter (Signed)
Patient is returning your call.  

## 2023-01-07 NOTE — Telephone Encounter (Signed)
Patient called in to let us know he received the okay from cardiology standpoint. Per patient & cardio OV notes, patient was recommended to see pulmonology regarding his symptoms. He is scheduled for an appt on 01/13/23. Advised patient I would let Amy, PA know & will give him a call back to confirm if it is okay to proceed with colonoscopy as scheduled. Pt verbalized all understanding. He is currently scheduled for a colon with Dr. Leone Payor on 01/12/23.

## 2023-01-07 NOTE — Telephone Encounter (Signed)
Left message for patient to call back  

## 2023-01-10 NOTE — Telephone Encounter (Signed)
Pt was made aware of Dr. Leone Payor recommendations to proceed with EGD and Colonoscopy:  Pt verbalized understanding with all questions answered.

## 2023-01-10 NOTE — Telephone Encounter (Signed)
I think ok to proceed and I also discussed w/ Cathlyn Parsons CRNA - move forward w/ EGD and colonoscopy as planned 7/10

## 2023-01-12 ENCOUNTER — Ambulatory Visit (AMBULATORY_SURGERY_CENTER): Payer: PPO | Admitting: Internal Medicine

## 2023-01-12 ENCOUNTER — Encounter: Payer: Self-pay | Admitting: Internal Medicine

## 2023-01-12 VITALS — BP 148/79 | HR 61 | Temp 97.7°F | Resp 12 | Ht 69.0 in | Wt 264.0 lb

## 2023-01-12 DIAGNOSIS — K219 Gastro-esophageal reflux disease without esophagitis: Secondary | ICD-10-CM

## 2023-01-12 DIAGNOSIS — R1310 Dysphagia, unspecified: Secondary | ICD-10-CM | POA: Diagnosis not present

## 2023-01-12 DIAGNOSIS — Z800 Family history of malignant neoplasm of digestive organs: Secondary | ICD-10-CM

## 2023-01-12 DIAGNOSIS — Z1211 Encounter for screening for malignant neoplasm of colon: Secondary | ICD-10-CM

## 2023-01-12 DIAGNOSIS — K5520 Angiodysplasia of colon without hemorrhage: Secondary | ICD-10-CM | POA: Diagnosis not present

## 2023-01-12 DIAGNOSIS — R195 Other fecal abnormalities: Secondary | ICD-10-CM

## 2023-01-12 MED ORDER — ONDANSETRON 4 MG PO TBDP: 4.0000 mg | ORAL_TABLET | Freq: Three times a day (TID) | ORAL | 5 refills | Status: DC | PRN

## 2023-01-12 MED ORDER — SODIUM CHLORIDE 0.9 % IV SOLN
500.0000 mL | Freq: Once | INTRAVENOUS | Status: DC
Start: 2023-01-12 — End: 2023-06-17

## 2023-01-12 NOTE — Patient Instructions (Addendum)
Upper endoscopy was normal - no narrow areas in esophagus. I did dilate the esophagus to see if that helps you swallow better. If it doesn't - let me know.  Colonoscopy ok again - you have diverticulosis and an AVM (angiodysplasia). The AVM is a spot where blood vessels are on surface of colon lining. Usually not a problem - sometimes they bleed and can be treated if they do.  As far as post-prandial diarrhea - in the past you had said ondansetron was helpful so I will try that again. Another thing to check on would be use of artificial sweeteners - eliminate those if you are ingesting. They cause diarrhea in many people.  Also - let me know if things don't get better with these measures - can send a My Chart message. The ondansetron can be taken before a meal. Another option is to try 1-2 Imodium before meals. Will discontinue dicyclomine as not helpful.  Your next routine colonoscopy should be in 5 years - 2029.  I appreciate the opportunity to care for you. Iva Boop, MD, [facility]   Discharge instructions given. Handouts on Diverticulosis,Dilatation diet. Resume previous medications. YOU HAD AN ENDOSCOPIC PROCEDURE TODAY AT THE [facility]:   Refer to the procedure report that was given to you for any specific questions about what was found during the examination.  If the procedure report does not answer your questions, please call your gastroenterologist to clarify.  If you requested that your care partner not be given the details of your procedure findings, then the procedure report has been included in a sealed envelope for you to review at your convenience later.  YOU SHOULD EXPECT: Some feelings of bloating in the abdomen. Passage of more gas than usual.  Walking can help get rid of the air that was put into your GI tract during the procedure and reduce the bloating. If you had a lower endoscopy (such as a colonoscopy or flexible sigmoidoscopy) you may notice spotting of  blood in your stool or on the toilet paper. If you underwent a bowel prep for your procedure, you may not have a normal bowel movement for a few days.  Please Note:  You might notice some irritation and congestion in your nose or some drainage.  This is from the oxygen used during your procedure.  There is no need for concern and it should clear up in a day or so.  SYMPTOMS TO REPORT IMMEDIATELY:  Following lower endoscopy (colonoscopy or flexible sigmoidoscopy):  Excessive amounts of blood in the stool  Significant tenderness or worsening of abdominal pains  Swelling of the abdomen that is new, acute  Fever of 100F or higher  Following upper endoscopy (EGD)  Vomiting of blood or coffee ground material  New chest pain or pain under the shoulder blades  Painful or persistently difficult swallowing  New shortness of breath  Fever of 100F or higher  Black, tarry-looking stools  For urgent or emergent issues, a gastroenterologist can be reached at any hour by calling [phone]. Do not use MyChart messaging for urgent concerns.    DIET:  We do recommend a small meal at first, but then you may proceed to your regular diet.  Drink plenty of fluids but you should avoid alcoholic beverages for 24 hours.  ACTIVITY:  You should plan to take it easy for the rest of today and you should NOT DRIVE or use heavy machinery until tomorrow (because of the sedation medicines used  during the test).    FOLLOW UP: Our staff will call the number listed on your records the next business day following your procedure.  We will call around 7:15- 8:00 am to check on you and address any questions or concerns that you may have regarding the information given to you following your procedure. If we do not reach you, we will leave a message.     If any biopsies were taken you will be contacted by phone or by letter within the next 1-3 weeks.  Please call us at [phone] if you have not heard about the  biopsies in 3 weeks.    SIGNATURES/CONFIDENTIALITY: You and/or your care partner have signed paperwork which will be entered into your electronic medical record.  These signatures attest to the fact that that the information above on your After Visit Summary has been reviewed and is understood.  Full responsibility of the confidentiality of this discharge information lies with you and/or your care-partner.

## 2023-01-12 NOTE — Op Note (Signed)
[facility] Patient Name: Jordan Mcconnell Procedure Date: 01/12/2023 10:37 AM MRN: [id number] Endoscopist: Iva Boop , MD, [id number] Age: 67 Referring MD:  Date of Birth: September 03, 1955 Gender: Male Account #: [id number] Procedure:                Colonoscopy Indications:              Screening in patient at increased risk: Family                            history of 1st-degree relative with colorectal                            cancer Medicines:                Monitored Anesthesia Care Procedure:                Pre-Anesthesia Assessment:                           - Prior to the procedure, a History and Physical                            was performed, and patient medications and                            allergies were reviewed. The patient's tolerance of                            previous anesthesia was also reviewed. The risks                            and benefits of the procedure and the sedation                            options and risks were discussed with the patient.                            All questions were answered, and informed consent                            was obtained. Prior Anticoagulants: The patient has                            taken no anticoagulant or antiplatelet agents. ASA                            Grade Assessment: III - A patient with severe                            systemic disease. After reviewing the risks and                            benefits, the patient was deemed in satisfactory  condition to undergo the procedure.                           After obtaining informed consent, the colonoscope                            was passed under direct vision. Throughout the                            procedure, the patient's blood pressure, pulse, and                            oxygen saturations were monitored continuously. The                            CF [id number] #[id number] was introduced through the anus                             and advanced to the the cecum, identified by                            appendiceal orifice and ileocecal valve. The                            colonoscopy was performed without difficulty. The                            patient tolerated the procedure well. The quality                            of the bowel preparation was good. The ileocecal                            valve, appendiceal orifice, and rectum were                            photographed. Scope In: 10:52:47 AM Scope Out: 11:01:18 AM Scope Withdrawal Time: 0 hours 7 minutes 8 seconds  Total Procedure Duration: 0 hours 8 minutes 31 seconds  Findings:                 The perianal and digital rectal examinations were                            normal.                           A single small angiodysplastic lesion without                            bleeding was found in the cecum.                           Multiple diverticula were found in the sigmoid  colon.                           The exam was otherwise without abnormality on                            direct and retroflexion views. Complications:            No immediate complications. Estimated Blood Loss:     Estimated blood loss: none. Impression:               - A single non-bleeding colonic angiodysplastic                            lesion.                           - Diverticulosis in the sigmoid colon.                           - The examination was otherwise normal on direct                            and retroflexion views except small hypertrophied                            anal papilla                           - No specimens collected. Recommendation:           - Patient has a contact number available for                            emergencies. The signs and symptoms of potential                            delayed complications were discussed with the                            patient. Return to normal  activities tomorrow.                            Written discharge instructions were provided to the                            patient.                           - Resume previous diet.                           - Continue present medications.                           - Repeat colonoscopy in 5 years for screening  purposes. Father thought to be < 60 at dx of CRCA Iva Boop, MD 01/12/2023 11:24:00 AM This report has been signed electronically.

## 2023-01-12 NOTE — Op Note (Signed)
[facility] Patient Name: Jordan Mcconnell Procedure Date: 01/12/2023 10:37 AM MRN: [id number] Endoscopist: Iva Boop , MD, [id number] Age: 67 Referring MD:  Date of Birth: 09/24/1955 Gender: Male Account #: [id number] Procedure:                Upper GI endoscopy Indications:              Dysphagia Medicines:                Monitored Anesthesia Care Procedure:                Pre-Anesthesia Assessment:                           - Prior to the procedure, a History and Physical                            was performed, and patient medications and                            allergies were reviewed. The patient's tolerance of                            previous anesthesia was also reviewed. The risks                            and benefits of the procedure and the sedation                            options and risks were discussed with the patient.                            All questions were answered, and informed consent                            was obtained. Prior Anticoagulants: The patient has                            taken no anticoagulant or antiplatelet agents. ASA                            Grade Assessment: III - A patient with severe                            systemic disease. After reviewing the risks and                            benefits, the patient was deemed in satisfactory                            condition to undergo the procedure.                           After obtaining informed consent, the endoscope was  passed under direct vision. Throughout the                            procedure, the patient's blood pressure, pulse, and                            oxygen saturations were monitored continuously. The                            Olympus Scope [id number] was introduced through the                            mouth, and advanced to the second part of duodenum.                            The upper GI endoscopy was  accomplished without                            difficulty. The patient tolerated the procedure                            well. Scope In: Scope Out: Findings:                 No endoscopic abnormality was evident in the                            esophagus to explain the patient's complaint of                            dysphagia. It was decided, however, to proceed with                            dilation of the entire esophagus. The scope was                            withdrawn. Dilation was performed with a Maloney                            dilator with no resistance at 54 Fr. The dilation                            site was examined following endoscope reinsertion                            and showed no change. Estimated blood loss: none.                           The gastroesophageal flap valve was visualized                            endoscopically and classified as Hill Grade II                            (  fold present, opens with respiration).                           The exam was otherwise without abnormality.                           The cardia and gastric fundus were normal on                            retroflexion. Complications:            No immediate complications. Estimated Blood Loss:     Estimated blood loss: none. Impression:               - No endoscopic esophageal abnormality to explain                            patient's dysphagia. Esophagus dilated. Dilated.                           - Gastroesophageal flap valve classified as Hill                            Grade II (fold present, opens with respiration).                           - The examination was otherwise normal.                           - No specimens collected. Recommendation:           - Patient has a contact number available for                            emergencies. The signs and symptoms of potential                            delayed complications were discussed with the                             patient. Return to normal activities tomorrow.                            Written discharge instructions were provided to the                            patient.                           - Clear liquids x 1 hour then soft foods rest of                            day. Start prior diet tomorrow.                           - Continue present medications.                           -  See the other procedure note for documentation of                            additional recommendations. Iva Boop, MD 01/12/2023 11:13:41 AM This report has been signed electronically.

## 2023-01-12 NOTE — Progress Notes (Signed)
[facility] History and Physical   Primary Care Physician:  Sheliah Hatch, MD   Reason for Procedure:   Dysphagia, FHx CRCA  Plan:    EGD and colonoscopy     HPI: Jordan Mcconnell is a 66 y.o. male seen 11/24/22 w/ following issues had neg cardiac eval.   44  67 year old white male with multiple month history of solid food dysphagia with very frequent episodes of food stopping in his esophagus requiring him to stop eating, no episodes requiring regurgitation.  No difficulty with liquids.  This is in the setting of chronic GERD history Suspect reflux related esophageal stricture, rule out esophageal ring, rule out dysmotility   #2 family history of colon cancer in patient's mother-patient is overdue for colonoscopy last done in 2017.  No polyps found at that time.   #3.  Chronic postprandial urgency and loose stool-previous random biopsies negative for microscopic colitis though he did have some reactive lymphoid changes.  This may be secondary to IBS. He is on metformin but says he has not ever noted any changes in loose stool with increased doses of metformin.   #4.  Chronic dyspnea present over the past couple of years-he says he is never returned to baseline after having COVID in 2021  Past Medical History:  Diagnosis Date   Arthritis    Asthma    Complication of anesthesia    one surgery pt. states he was hard to wake up   Diabetes mellitus without complication (HCC)    type 2   GERD (gastroesophageal reflux disease)    HTN (hypertension)    Irritable bowel syndrome with diarrhea 05/12/2016   Obesity    OSA (obstructive sleep apnea)    uses CPAP   PONV (postoperative nausea and vomiting)     Past Surgical History:  Procedure Laterality Date   BACK SURGERY  08/2015   COLONOSCOPY     ESOPHAGEAL DILATION     1 or 2 times   ESOPHAGOGASTRODUODENOSCOPY N/A 10/30/2013   Procedure: ESOPHAGOGASTRODUODENOSCOPY (EGD);  Surgeon: Charolett Bumpers, MD;   Location: [facility];  Service: Endoscopy;  Laterality: N/A;   JOINT REPLACEMENT     bilateral knees   LEFT HEART CATH AND CORONARY ANGIOGRAPHY N/A 07/13/2018   Procedure: LEFT HEART CATH AND CORONARY ANGIOGRAPHY;  Surgeon: Corky Crafts, MD;  Location: [facility] INVASIVE CV LAB;  Service: Cardiovascular;  Laterality: N/A;   SHOULDER ARTHROSCOPY     Bilateral 3 times each shoulder   TOTAL HIP ARTHROPLASTY Left 04/16/2019   Procedure: TOTAL HIP ARTHROPLASTY ANTERIOR APPROACH;  Surgeon: Ollen Gross, MD;  Location: [facility];  Service: Orthopedics;  Laterality: Left;     Prior to Admission medications   Medication Sig Start Date End Date Taking? Authorizing Provider  hydrochlorothiazide (HYDRODIURIL) 25 MG tablet Take 1 tablet (25 mg total) by mouth daily. 12/16/22  Yes Sheliah Hatch, MD  metFORMIN (GLUCOPHAGE) 1000 MG tablet Take 1 tablet (1,000 mg total) by mouth 2 (two) times daily with a meal. 12/16/22  Yes Sheliah Hatch, MD  montelukast (SINGULAIR) 10 MG tablet Take 10 mg by mouth at bedtime. 09/13/22  Yes [provider]  omeprazole (PRILOSEC) 40 MG capsule Take 1 capsule (40 mg total) by mouth daily. 12/16/22  Yes Sheliah Hatch, MD  rosuvastatin (CRESTOR) 20 MG tablet Take 1 tablet (20 mg total) by mouth daily. 12/23/22 03/23/23 Yes O'Neal, Ronnald Ramp, MD  APPLE CIDER VINEGAR PO Take by mouth.  [provider]  dicyclomine (BENTYL) 10 MG capsule Take one capsule by mouth 30 to 45 minutes before meals as needed for urgency 11/24/22   Esterwood, Amy S, PA-C  loratadine (CLARITIN) 10 MG tablet Take 10 mg by mouth daily.    [provider]    Current Outpatient Medications  Medication Sig Dispense Refill   hydrochlorothiazide (HYDRODIURIL) 25 MG tablet Take 1 tablet (25 mg total) by mouth daily. 90 tablet 1   metFORMIN (GLUCOPHAGE) 1000 MG tablet Take 1 tablet (1,000 mg total) by mouth 2 (two) times daily with a meal. 180 tablet 3    montelukast (SINGULAIR) 10 MG tablet Take 10 mg by mouth at bedtime.     omeprazole (PRILOSEC) 40 MG capsule Take 1 capsule (40 mg total) by mouth daily. 90 capsule 0   rosuvastatin (CRESTOR) 20 MG tablet Take 1 tablet (20 mg total) by mouth daily. 90 tablet 3   APPLE CIDER VINEGAR PO Take by mouth.     dicyclomine (BENTYL) 10 MG capsule Take one capsule by mouth 30 to 45 minutes before meals as needed for urgency 90 capsule 6   loratadine (CLARITIN) 10 MG tablet Take 10 mg by mouth daily.     Current Facility-Administered Medications  Medication Dose Route Frequency Provider Last Rate Last Admin   0.9 %  sodium chloride infusion  500 mL Intravenous Once Iva Boop, MD        Allergies as of 01/12/2023 - Review Complete 01/12/2023  Allergen Reaction Noted   Penicillins Rash 04/12/2013   Sulfa antibiotics Rash 04/12/2013   Codeine Nausea Only 07/30/2015   Tramadol Nausea Only 06/24/2015    Family History  Problem Relation Age of Onset   Memory loss Mother    Heart murmur Mother    Colon cancer Father    Stroke Father    Cancer Father        colon   Dementia Father    Asthma Maternal Grandmother    Memory loss Maternal Grandmother    Cancer Maternal Grandfather        lung and bone   Asthma Paternal Grandmother    Liver disease Neg Hx    Esophageal cancer Neg Hx     Social History   Socioeconomic History   Marital status: Married    Spouse name: Not on file   Number of children: 2   Years of education: Not on file   Highest education level: Not on file  Occupational History   Occupation: retired   Occupation: Retired Water quality scientist  Tobacco Use   Smoking status: Never   Smokeless tobacco: Never  Vaping Use   Vaping Use: Never used  Substance and Sexual Activity   Alcohol use: No   Drug use: No   Sexual activity: Yes  Other Topics Concern   Not on file  Social History Narrative   Not on file   Social Determinants of Health   Financial Resource  Strain: Low Risk  (10/04/2022)   Overall Financial Resource Strain (CARDIA)    Difficulty of Paying Living Expenses: Not hard at all  Food Insecurity: No Food Insecurity (10/07/2022)   Hunger Vital Sign    Worried About Running Out of Food in the Last Year: Never true    Ran Out of Food in the Last Year: Never true  Transportation Needs: No Transportation Needs (10/04/2022)   PRAPARE - Transportation    Lack of Transportation (Medical): No    Lack of  Transportation (Non-Medical): No  Physical Activity: Insufficiently Active (10/04/2022)   Exercise Vital Sign    Days of Exercise per Week: 4 days    Minutes of Exercise per Session: 30 min  Stress: Stress Concern Present (10/07/2022)   [employer] of Occupational Health - Occupational Stress Questionnaire    Feeling of Stress : To some extent  Social Connections: Unknown (10/04/2022)   Social Connection and Isolation Panel [NHANES]    Frequency of Communication with Friends and Family: More than three times a week    Frequency of Social Gatherings with Friends and Family: Once a week    Attends Religious Services: Not on Marketing executive or Organizations: Yes    Attends Banker Meetings: More than 4 times per year    Marital Status: Married  Catering manager Violence: Not At Risk (10/07/2022)   Humiliation, Afraid, Rape, and Kick questionnaire    Fear of Current or Ex-Partner: No    Emotionally Abused: No    Physically Abused: No    Sexually Abused: No    Review of Systems: dyspnea All other review of systems negative except as mentioned in the HPI.  Physical Exam: Vital signs BP (!) 149/82   Pulse 83   Temp 97.7 F (36.5 C) (Temporal)   Ht 5\' 9"  (1.753 m)   Wt 264 lb (119.7 kg)   SpO2 96%   BMI 38.99 kg/m   General:   Alert,  Well-developed, well-nourished, pleasant and cooperative in NAD Lungs:  Clear throughout to auscultation.   Heart:  Regular rate and rhythm; no murmurs, clicks, rubs,  or  gallops. Abdomen:  Soft, nontender and nondistended. Normal bowel sounds.   Neuro/Psych:  Alert and cooperative. Normal mood and affect. A and O x 3   @Skylene Deremer  Sena Slate, MD, [facility] [phone] (pager) 01/12/2023 10:34 AM@

## 2023-01-12 NOTE — Progress Notes (Signed)
To pacu, VSS. Report to [email or web address] 

## 2023-01-13 ENCOUNTER — Encounter (HOSPITAL_BASED_OUTPATIENT_CLINIC_OR_DEPARTMENT_OTHER): Payer: Self-pay | Admitting: Pulmonary Disease

## 2023-01-13 ENCOUNTER — Ambulatory Visit (INDEPENDENT_AMBULATORY_CARE_PROVIDER_SITE_OTHER): Payer: PPO | Admitting: Pulmonary Disease

## 2023-01-13 ENCOUNTER — Other Ambulatory Visit (HOSPITAL_COMMUNITY): Payer: Self-pay

## 2023-01-13 ENCOUNTER — Telehealth: Payer: Self-pay

## 2023-01-13 VITALS — BP 144/78 | HR 68 | Temp 97.7°F | Ht 69.0 in | Wt 274.6 lb

## 2023-01-13 DIAGNOSIS — U099 Post covid-19 condition, unspecified: Secondary | ICD-10-CM

## 2023-01-13 DIAGNOSIS — R0602 Shortness of breath: Secondary | ICD-10-CM | POA: Diagnosis not present

## 2023-01-13 MED ORDER — ALBUTEROL SULFATE HFA 108 (90 BASE) MCG/ACT IN AERS: 2.0000 | INHALATION_SPRAY | Freq: Four times a day (QID) | RESPIRATORY_TRACT | 3 refills | Status: DC | PRN

## 2023-01-13 NOTE — Progress Notes (Signed)
Subjective:   PATIENT ID: Jordan Mcconnell GENDER: male DOB: 04-07-1956, MRN: [id number]  Chief Complaint  Patient presents with   Consult    Consult. Patient here for SOB.     Reason for Visit: New consult for shortness of breath  [name] is a never smoker with OSA on CPAP, HTN, allergic rhinitis, GERD, IBS, DM2, HLD, OA of the hip, skin cancer of hand who presents for evaluation for shortness of breath.  He reports long standing shortness of breath for at least three years. He had covid in 07/2019 but he has had respiratory issues before this probably for the last 10-20 years. Reports annual bronchitis that would last for 1-2 week. He has had more violent sneezes since covid. Reports wheezing and chest tightness with moderate exertion. Denies night symptoms but on his CPAP. Played sports in high school but maybe more short winded. Not currently exercise regularly due to the breathing issues. Has tried Symbicort without improvement. He was evaluated by Cardiology. Cardiac work-up in 2020 was overall negative.   Social History: Never smoker Family hx + asthma  I have personally reviewed patient's past medical/family/social history, allergies, current medications.  Past Medical History:  Diagnosis Date   Angiodysplasia of cecum    Arthritis    Asthma    Complication of anesthesia    one surgery pt. states he was hard to wake up   Diabetes mellitus without complication (HCC)    type 2   GERD (gastroesophageal reflux disease)    HTN (hypertension)    Irritable bowel syndrome with diarrhea 05/12/2016   Obesity    OSA (obstructive sleep apnea)    uses CPAP   PONV (postoperative nausea and vomiting)      Family History  Problem Relation Age of Onset   Memory loss Mother    Heart murmur Mother    Colon cancer Father    Stroke Father    Cancer Father        colon   Dementia Father    Asthma Maternal Grandmother    Memory loss Maternal Grandmother    Cancer  Maternal Grandfather        lung and bone   Asthma Paternal Grandmother    Liver disease Neg Hx    Esophageal cancer Neg Hx      Social History   Occupational History   Occupation: retired   Occupation: Retired Water quality scientist  Tobacco Use   Smoking status: Never   Smokeless tobacco: Never  Advertising account planner   Vaping status: Never Used  Substance and Sexual Activity   Alcohol use: No   Drug use: No   Sexual activity: Yes    Allergies  Allergen Reactions   Penicillins Rash    DID THE REACTION INVOLVE: Swelling of the face/tongue/throat, SOB, or low BP? No Sudden or severe rash/hives, skin peeling, or the inside of the mouth or nose? Yes Did it require medical treatment? Yes When did it last happen?      childhood allergy If all above answers are "NO", may proceed with cephalosporin use.     Sulfa Antibiotics Rash   Codeine Nausea Only   Tramadol Nausea Only    Severe dizziness     Outpatient Medications Prior to Visit  Medication Sig Dispense Refill   APPLE CIDER VINEGAR PO Take by mouth.     hydrochlorothiazide (HYDRODIURIL) 25 MG tablet Take 1 tablet (25 mg total) by mouth daily. 90 tablet 1  loratadine (CLARITIN) 10 MG tablet Take 10 mg by mouth daily.     metFORMIN (GLUCOPHAGE) 1000 MG tablet Take 1 tablet (1,000 mg total) by mouth 2 (two) times daily with a meal. 180 tablet 3   montelukast (SINGULAIR) 10 MG tablet Take 10 mg by mouth at bedtime.     omeprazole (PRILOSEC) 40 MG capsule Take 1 capsule (40 mg total) by mouth daily. 90 capsule 0   ondansetron (ZOFRAN-ODT) 4 MG disintegrating tablet Take 1 tablet (4 mg total) by mouth every 8 (eight) hours as needed (to stop post-prandial diarrhea). 30 tablet 5   rosuvastatin (CRESTOR) 20 MG tablet Take 1 tablet (20 mg total) by mouth daily. (Patient not taking: Reported on 01/13/2023) 90 tablet 3   Facility-Administered Medications Prior to Visit  Medication Dose Route Frequency Provider Last Rate Last Admin   0.9 %   sodium chloride infusion  500 mL Intravenous Once Iva Boop, MD        Review of Systems  Constitutional:  Negative for chills, diaphoresis, fever, malaise/fatigue and weight loss.  HENT:  Positive for congestion.   Respiratory:  Positive for shortness of breath. Negative for cough, hemoptysis, sputum production and wheezing.   Cardiovascular:  Negative for chest pain, palpitations and leg swelling.  Gastrointestinal:  Positive for heartburn.     Objective:   Vitals:   01/13/23 0905  BP: (!) 144/78  Pulse: 68  Temp: 97.7 F (36.5 C)  TempSrc: Oral  SpO2: 96%  Weight: 274 lb 9.6 oz (124.6 kg)  Height: 5\' 9"  (1.753 m)   SpO2: 96 % O2 Device: None (Room air)  Physical Exam: General: Well-appearing, no acute distress HENT: [place], AT Eyes: EOMI, no scleral icterus Respiratory: Clear to auscultation bilaterally.  No crackles, wheezing or rales Cardiovascular: RRR, -M/R/G, no JVD Extremities:-Edema,-tenderness Neuro: AAO x4, CNII-XII grossly intact Psych: Normal mood, normal affect  Data Reviewed:  Imaging: CT Chest 08/28/19 - Mild bilateral GGO, unchanged small bilateral nodules CT Coronary 12/22/22 - RLL 5 mm. Otherwise overall normal parenchyma in visualized areas with no masses, effusion or edema  PFT: 11/06/19 FVC 4.19 (92%) FEV1 3.50 (103%) Ratio 84  TLC 114% DLCO 92% Interpretation: Normal spirometry  11/02/22 FVC 2.70 (62%) FEV1 2.11 (65%) Ratio 78   Interpretation: Normal spirometry. Reduced FVC and FEV1  Labs: CBC    Component Value Date/Time   WBC 6.9 09/13/2022 1349   RBC 5.17 09/13/2022 1349   HGB 15.2 09/13/2022 1349   HCT 44.9 09/13/2022 1349   PLT 181.0 09/13/2022 1349   MCV 86.8 09/13/2022 1349   MCH 28.4 07/06/2019 1238   MCHC 33.8 09/13/2022 1349   RDW 14.1 09/13/2022 1349   LYMPHSABS 2.5 09/13/2022 1349   MONOABS 0.4 09/13/2022 1349   EOSABS 0.3 09/13/2022 1349   BASOSABS 0.1 09/13/2022 1349   Absolute eos 09/13/22 - 300      Assessment & Plan:   Discussion: May be related to covid long hauler that worsened his baseline symptoms. We reviewed clinical course of COVID-19 including long-term complications including post-inflammatory lung disease and long hauler symptoms. Counseled on rescue inhaler use for symptoms and prior to activity. Also suspect deconditioning is contributing and counseled on how this can affect breathing. Will trial Breztri and have patient contact us for refills if helpful. Full PFTs ordered to rule out obstructive or restrictive defect based on recent spirometry in April 2024.   COVID-19 long hauler Shortness of breath Wheezing --START Albuterol TWO puffs AS NEEDED  for shortness of breath or wheezing --ORDER pulmonary function tests --Encourage aerobic activity for 20-30 minutes  Health Maintenance Immunization History  Administered Date(s) Administered   Influenza Whole 05/18/2018   Influenza,inj,Quad PF,6+ Mos 04/27/2018, 03/26/2019, 04/19/2020   Influenza-Unspecified 04/19/2020, 04/03/2021, 04/08/2022   PFIZER(Purple Top)SARS-COV-2 Vaccination 10/09/2019, 10/30/2019, 05/17/2020, 04/08/2022   Pfizer Covid-19 Vaccine Bivalent Booster 86yrs & up 12/23/2020   Tdap 07/07/2016   CT Lung Screen - not qualified. Never smoker  Orders Placed This Encounter  Procedures   Pulmonary function test    Standing Status:   Future    Standing Expiration Date:   01/13/2024    Order Specific Question:   Where should this test be performed?    Answer:   [facility]    Order Specific Question:   Full PFT: includes the following: basic spirometry, spirometry pre & post bronchodilator, diffusion capacity (DLCO), lung volumes    Answer:   Full PFT   Meds ordered this encounter  Medications   albuterol (VENTOLIN HFA) 108 (90 Base) MCG/ACT inhaler    Sig: Inhale 2 puffs into the lungs every 6 (six) hours as needed for wheezing or shortness of breath.    Dispense:  8 g    Refill:  3    Return  for after PFT.  I have spent a total time of 45-minutes on the day of the appointment reviewing prior documentation, coordinating care and discussing medical diagnosis and plan with the patient/family. Imaging, labs and tests included in this note have been reviewed and interpreted independently by me.  Leitha Hyppolite Mechele Collin, MD [facility] 01/13/2023 9:54 AM  Office Number [phone]

## 2023-01-13 NOTE — Patient Instructions (Addendum)
COVID-19 long hauler Shortness of breath Wheezing Deconditioning  --START Albuterol TWO puffs AS NEEDED for shortness of breath or wheezing --ORDER pulmonary function tests --Encourage aerobic activity for 20-30 minutes

## 2023-01-13 NOTE — Telephone Encounter (Signed)
  Follow up Call-     01/12/2023    9:47 AM  Call back number  Post procedure Call Back phone  # [phone]  Permission to leave phone message Yes     Patient questions:  Do you have a fever, pain , or abdominal swelling? No. Pain Score  0 *  Have you tolerated food without any problems? Yes.    Have you been able to return to your normal activities? Yes.    Do you have any questions about your discharge instructions: Diet   No. Medications  No. Follow up visit  No.  Do you have questions or concerns about your Care? No.  Actions: * If pain score is 4 or above: No action needed, pain <4.

## 2023-01-14 ENCOUNTER — Telehealth: Payer: Self-pay | Admitting: Family Medicine

## 2023-01-14 NOTE — Telephone Encounter (Signed)
I think it's reasonable to try again.  If his liver enzymes again increase, we will know that he is not able to tolerate that particular medication.  That is why we keep tabs on these things after starting meds.

## 2023-01-14 NOTE — Telephone Encounter (Signed)
I have informed pt of  Dr Beverely Low message

## 2023-01-14 NOTE — Telephone Encounter (Signed)
Patient expressed concerns about being directed to take statins by cardiology after Dr Beverely Low had advised him to stop. Wants to know now what he should do. Please advise if he should or should not take them or if he should defer to a conversation with cardiology?

## 2023-01-14 NOTE — Telephone Encounter (Signed)
Caller name: TREJAN SCHERR  On DPR?: Yes  Call back number: [phone] (home)  Provider they see: Sheliah Hatch, MD  Reason for call:  Pt walked in after echo done 2 weeks ago and would like Tabori advise on retaking 'statins' being prescribed by cardiologist.

## 2023-01-21 ENCOUNTER — Telehealth: Payer: Self-pay | Admitting: Pharmacy Technician

## 2023-01-21 ENCOUNTER — Other Ambulatory Visit (HOSPITAL_COMMUNITY): Payer: Self-pay

## 2023-01-21 NOTE — Telephone Encounter (Signed)
Pharmacy Patient Advocate Encounter   Received notification from CoverMyMeds that prior authorization for ONDANSETRON 4MG  is required/requested.   Insurance verification completed.   The patient is insured through [employer]/ [employer] .   Per test claim: PA submitted to [employer]/ [employer] via Fax Key/confirmation #/EOC [id number] Status is pending  FAX# [phone]

## 2023-01-26 NOTE — Telephone Encounter (Signed)
Pharmacy Patient Advocate Encounter  Received notification from [employer] that Prior Authorization for ONDANSETRON 4MG  has been DENIED. Please advise how you'd like to proceed. Full denial letter will be uploaded to the media tab. See denial reason below.  PA #/Case ID/Reference #: [id number]  Please be advised we currently do not have a Pharmacist to review denials, therefore you will need to process appeals accordingly as needed. Thanks for your support at this time. Contact for appeals are as follows: Phone: [phone], Fax: [phone]

## 2023-01-27 NOTE — Telephone Encounter (Signed)
Jordan Mcconnell said he is not having nausea now so he is not going to worry about picking up this rx. I told him he could get it with [facility] if his insurance didn't cover it.

## 2023-01-31 NOTE — Progress Notes (Deleted)
Follow Up Note  RE: Jordan Mcconnell MRN: [id number] DOB: 10-Oct-1955 Date of Office Visit: 02/01/2023  Referring provider: Sheliah Hatch, MD Primary care provider: Sheliah Hatch, MD  Chief Complaint: No chief complaint on file.  History of Present Illness: I had the pleasure of seeing Jordan Mcconnell for a follow up visit at the [facility] on 01/31/2023. He is a 67 y.o. male, who is being followed for dyspnea, allergic rhinitis, GERD, dysphagia. His previous allergy office visit was on 11/02/2022 with Dr. Selena Batten. Today is a regular follow up visit.  Dyspnea Past history - Dyspnea with coughing and wheezing for 10 years.  Lately has been having daily symptoms and recently started using albuterol with good benefit.  Patient had COVID-19 and was followed by pulmonology in the past.  2021 CT chest showed groundglass opacities and small unchanged bilateral lung nodules. Wears CPAP and has reflux. Negative cardiac work up in the past. Interim history - no worsening symptoms with stopping inhalers. Still has symptoms with minimal exertion.  Today's spirometry showed mixed obstruction/restriction but had questionable effort. There was 53% improvement in FEV1 post bronchodilator treatment. Clinically feeling unchanged. Improvement may have been due to better effort post treatment.  Will refer to cardiology to rule out cardiac issues. If negative work up, will get CT chest next.    Seasonal and perennial allergic rhinitis Past history - 2022 skin testing was positive to grass pollen, ragweed pollen, weed pollen, mold, dust mites, and cockroach.  Interim history - PND in the am.  Continue environmental control measures. Continue Singulair (montelukast) 10mg  daily at night. Use over the counter antihistamines such as Zyrtec (cetirizine), Claritin (loratadine), Allegra (fexofenadine), or Xyzal (levocetirizine) daily as needed. May switch antihistamines every few  months. Declined nasal spray.   Gastroesophageal reflux disease Continue dietary and lifestyle modifications.  Continue omeprazole 40 mg once a day to prevent reflux.   Dysphagia Past history - dysphagia and mentions dilatations in the past. He takes PPI daily for GERD. Does not recall being diagnosed with eoe. Interim history - unchanged.  Refer to GI for dysphagia.   11/24/2022 GI visit: "#64  67 year old white male with multiple month history of solid food dysphagia with very frequent episodes of food stopping in his esophagus requiring him to stop eating, no episodes requiring regurgitation.  No difficulty with liquids.  This is in the setting of chronic GERD history Suspect reflux related esophageal stricture, rule out esophageal ring, rule out dysmotility   #2 family history of colon cancer in patient's mother-patient is overdue for colonoscopy last done in 2017.  No polyps found at that time.   #3.  Chronic postprandial urgency and loose stool-previous random biopsies negative for microscopic colitis though he did have some reactive lymphoid changes.  This may be secondary to IBS. He is on metformin but says he has not ever noted any changes in loose stool with increased doses of metformin.   #4.  Chronic dyspnea present over the past couple of years-he says he is never returned to baseline after having COVID in 2021. Dyspnea is exertional Cardiac appointment pending next week   #5 hypertension 6.  Sleep apnea with CPAP use 7.  Obesity BMI 38 8.  Adult onset diabetes mellitus 10.  Osteoarthritis   Plan; Continue omeprazole 40 mg p.o. every morning AC breakfast Will start a trial of Bentyl 10 mg 3 times daily 30 to 45 minutes AC as needed. We discussed timing  of Colonoscopy and EGD with dilation.  Since he has an appointment with cardiology for further evaluation of his ongoing dyspnea I think it is prudent to wait for cardiac evaluation prior to sedation.  We will intentionally  schedule him for procedures with Dr. Leone Payor in mid to late July.  I have also asked them to call back and speak to my nurse after he has had cardiac workup so that we can review results for appropriateness of sedation.  They voiced understanding."  12/15/2022 Cards visit: "1. SOB (shortness of breath) on exertion 2. Precordial pain -Exertional shortness of breath and chest tightness for over a year.  Left heart catheterization in 2020 with minimal CAD in the LAD territory.  25%.  His EKG is normal.  His echo was normal in 2020.  He does have pulmonary function testing which shows obstructive disease and restriction.  I do wonder if this is weight related.  We did discuss coronary CTA to exclude obstructive CAD.  He will need a BMP.  He will take 100 mg metoprolol tartrate 2 hours before the scan.  No signs of congestive heart failure.  No murmurs.  I would like to repeat his echo.  If cardiac workup is negative would recommend pulmonary evaluation.  We discussed going ahead with pulmonary given obstruction and restriction seen on PFTs.  He is okay with this.   3. Coronary artery disease involving native coronary artery of native heart without angina pectoris 4. Mixed hyperlipidemia -Minimal CAD.  LDL 71.  No need for aspirin.  Further risk stratification with coronary CTA.  Suspect he would benefit from a statin.  We will hold on dosing pending coronary CTA."  01/13/2023 pulm visit: "Discussion: May be related to covid long hauler that worsened his baseline symptoms. We reviewed clinical course of COVID-19 including long-term complications including post-inflammatory lung disease and long hauler symptoms. Counseled on rescue inhaler use for symptoms and prior to activity. Also suspect deconditioning is contributing and counseled on how this can affect breathing. Will trial Breztri and have patient contact us for refills if helpful. Full PFTs ordered to rule out obstructive or restrictive defect based on  recent spirometry in April 2024.     COVID-19 long hauler Shortness of breath Wheezing --START Albuterol TWO puffs AS NEEDED for shortness of breath or wheezing --ORDER pulmonary function tests --Encourage aerobic activity for 20-30 minutes"    Assessment and Plan: Jordan Mcconnell is a 68 y.o. male with: No problem-specific Assessment & Plan notes found for this encounter.  No follow-ups on file.  No orders of the defined types were placed in this encounter.  Lab Orders  No laboratory test(s) ordered today    Diagnostics: Spirometry:  Tracings reviewed. His effort: {Blank single:19197::"Good reproducible efforts.","It was hard to get consistent efforts and there is a question as to whether this reflects a maximal maneuver.","Poor effort, data can not be interpreted."} FVC: ***L FEV1: ***L, ***% predicted FEV1/FVC ratio: ***% Interpretation: {Blank single:19197::"Spirometry consistent with mild obstructive disease","Spirometry consistent with moderate obstructive disease","Spirometry consistent with severe obstructive disease","Spirometry consistent with possible restrictive disease","Spirometry consistent with mixed obstructive and restrictive disease","Spirometry uninterpretable due to technique","Spirometry consistent with normal pattern","No overt abnormalities noted given today's efforts"}.  Please see scanned spirometry results for details.  Skin Testing: {Blank single:19197::"Select foods","Environmental allergy panel","Environmental allergy panel and select foods","Food allergy panel","None","Deferred due to recent antihistamines use"}. *** Results discussed with patient/family.   Medication List:  Current Outpatient Medications  Medication Sig Dispense Refill  . albuterol (  VENTOLIN HFA) 108 (90 Base) MCG/ACT inhaler Inhale 2 puffs into the lungs every 6 (six) hours as needed for wheezing or shortness of breath. 8 g 3  . APPLE CIDER VINEGAR PO Take by mouth.    .  hydrochlorothiazide (HYDRODIURIL) 25 MG tablet Take 1 tablet (25 mg total) by mouth daily. 90 tablet 1  . loratadine (CLARITIN) 10 MG tablet Take 10 mg by mouth daily.    . metFORMIN (GLUCOPHAGE) 1000 MG tablet Take 1 tablet (1,000 mg total) by mouth 2 (two) times daily with a meal. 180 tablet 3  . montelukast (SINGULAIR) 10 MG tablet Take 10 mg by mouth at bedtime.    Marland Kitchen omeprazole (PRILOSEC) 40 MG capsule Take 1 capsule (40 mg total) by mouth daily. 90 capsule 0  . ondansetron (ZOFRAN-ODT) 4 MG disintegrating tablet Take 1 tablet (4 mg total) by mouth every 8 (eight) hours as needed (to stop post-prandial diarrhea). 30 tablet 5  . rosuvastatin (CRESTOR) 20 MG tablet Take 1 tablet (20 mg total) by mouth daily. (Patient not taking: Reported on 01/13/2023) 90 tablet 3   Current Facility-Administered Medications  Medication Dose Route Frequency Provider Last Rate Last Admin  . 0.9 %  sodium chloride infusion  500 mL Intravenous Once Iva Boop, MD       Allergies: Allergies  Allergen Reactions  . Penicillins Rash    DID THE REACTION INVOLVE: Swelling of the face/tongue/throat, SOB, or low BP? No Sudden or severe rash/hives, skin peeling, or the inside of the mouth or nose? Yes Did it require medical treatment? Yes When did it last happen?      childhood allergy If all above answers are "NO", may proceed with cephalosporin use.    . Sulfa Antibiotics Rash  . Codeine Nausea Only  . Tramadol Nausea Only    Severe dizziness   I reviewed his past medical history, social history, family history, and environmental history and no significant changes have been reported from his previous visit.  Review of Systems  Constitutional:  Negative for appetite change, chills, fever and unexpected weight change.  HENT:  Negative for congestion, postnasal drip and rhinorrhea.   Eyes:  Negative for itching.  Respiratory:  Positive for cough and shortness of breath. Negative for chest tightness and  wheezing.   Cardiovascular:  Negative for chest pain.  Gastrointestinal:  Negative for abdominal pain.  Genitourinary:  Negative for difficulty urinating.  Skin:  Negative for rash.  Allergic/Immunologic: Positive for environmental allergies.   Objective: There were no vitals taken for this visit. There is no height or weight on file to calculate BMI. Physical Exam Vitals and nursing note reviewed.  Constitutional:      Appearance: Normal appearance. He is well-developed. He is obese.  HENT:     Head: Normocephalic and atraumatic.     Right Ear: Tympanic membrane and external ear normal.     Left Ear: Tympanic membrane and external ear normal.     Nose: Nose normal.     Mouth/Throat:     Mouth: Mucous membranes are moist.     Pharynx: Oropharynx is clear.  Eyes:     Conjunctiva/sclera: Conjunctivae normal.  Cardiovascular:     Rate and Rhythm: Normal rate and regular rhythm.     Heart sounds: Normal heart sounds. No murmur heard.    No friction rub. No gallop.  Pulmonary:     Effort: Pulmonary effort is normal.     Breath sounds: Normal breath sounds. No  wheezing, rhonchi or rales.  Musculoskeletal:     Cervical back: Neck supple.  Skin:    General: Skin is warm.     Findings: No rash.  Neurological:     Mental Status: He is alert and oriented to person, place, and time.  Psychiatric:        Behavior: Behavior normal.  Previous notes and tests were reviewed. The plan was reviewed with the patient/family, and all questions/concerned were addressed.  It was my pleasure to see Jordan Mcconnell today and participate in his care. Please feel free to contact me with any questions or concerns.  Sincerely,  Wyline Mood, DO Allergy & Immunology  [facility] office: [phone] [facility] office: [phone]

## 2023-02-01 ENCOUNTER — Ambulatory Visit: Payer: PPO | Admitting: Allergy

## 2023-02-03 ENCOUNTER — Other Ambulatory Visit (HOSPITAL_COMMUNITY): Payer: Self-pay

## 2023-02-17 ENCOUNTER — Encounter (INDEPENDENT_AMBULATORY_CARE_PROVIDER_SITE_OTHER): Payer: Self-pay

## 2023-02-17 ENCOUNTER — Encounter (HOSPITAL_BASED_OUTPATIENT_CLINIC_OR_DEPARTMENT_OTHER): Payer: Self-pay | Admitting: Pulmonary Disease

## 2023-02-21 LAB — HM DIABETES EYE EXAM

## 2023-03-18 ENCOUNTER — Encounter: Payer: Self-pay | Admitting: Family Medicine

## 2023-03-18 ENCOUNTER — Ambulatory Visit (INDEPENDENT_AMBULATORY_CARE_PROVIDER_SITE_OTHER): Payer: PPO | Admitting: Family Medicine

## 2023-03-18 VITALS — BP 126/86 | HR 74 | Temp 98.2°F | Resp 18 | Ht 69.0 in | Wt 282.1 lb

## 2023-03-18 DIAGNOSIS — E785 Hyperlipidemia, unspecified: Secondary | ICD-10-CM

## 2023-03-18 DIAGNOSIS — Z23 Encounter for immunization: Secondary | ICD-10-CM | POA: Diagnosis not present

## 2023-03-18 DIAGNOSIS — E1169 Type 2 diabetes mellitus with other specified complication: Secondary | ICD-10-CM

## 2023-03-18 DIAGNOSIS — I10 Essential (primary) hypertension: Secondary | ICD-10-CM | POA: Diagnosis not present

## 2023-03-18 DIAGNOSIS — Z7984 Long term (current) use of oral hypoglycemic drugs: Secondary | ICD-10-CM

## 2023-03-18 DIAGNOSIS — E119 Type 2 diabetes mellitus without complications: Secondary | ICD-10-CM

## 2023-03-18 LAB — CBC WITH DIFFERENTIAL/PLATELET
Basophils Absolute: 0.1 10*3/uL (ref 0.0–0.1)
Basophils Relative: 0.9 % (ref 0.0–3.0)
Eosinophils Absolute: 0.3 10*3/uL (ref 0.0–0.7)
Eosinophils Relative: 3.4 % (ref 0.0–5.0)
HCT: 44.6 % (ref 39.0–52.0)
Hemoglobin: 14.8 g/dL (ref 13.0–17.0)
Lymphocytes Relative: 28.5 % (ref 12.0–46.0)
Lymphs Abs: 2.6 10*3/uL (ref 0.7–4.0)
MCHC: 33.2 g/dL (ref 30.0–36.0)
MCV: 88 fl (ref 78.0–100.0)
Monocytes Absolute: 0.7 10*3/uL (ref 0.1–1.0)
Monocytes Relative: 7.5 % (ref 3.0–12.0)
Neutro Abs: 5.4 10*3/uL (ref 1.4–7.7)
Neutrophils Relative %: 59.7 % (ref 43.0–77.0)
Platelets: 183 10*3/uL (ref 150.0–400.0)
RBC: 5.07 Mil/uL (ref 4.22–5.81)
RDW: 13.6 % (ref 11.5–15.5)
WBC: 9.1 10*3/uL (ref 4.0–10.5)

## 2023-03-18 LAB — HEPATIC FUNCTION PANEL
ALT: 73 U/L — ABNORMAL HIGH (ref 0–53)
AST: 49 U/L — ABNORMAL HIGH (ref 0–37)
Albumin: 4 g/dL (ref 3.5–5.2)
Alkaline Phosphatase: 89 U/L (ref 39–117)
Bilirubin, Direct: 0.1 mg/dL (ref 0.0–0.3)
Total Bilirubin: 0.6 mg/dL (ref 0.2–1.2)
Total Protein: 6.8 g/dL (ref 6.0–8.3)

## 2023-03-18 LAB — BASIC METABOLIC PANEL
BUN: 15 mg/dL (ref 6–23)
CO2: 26 meq/L (ref 19–32)
Calcium: 9.2 mg/dL (ref 8.4–10.5)
Chloride: 98 meq/L (ref 96–112)
Creatinine, Ser: 0.72 mg/dL (ref 0.40–1.50)
GFR: 95.13 mL/min (ref >60.00)
Glucose, Bld: 186 mg/dL — ABNORMAL HIGH (ref 70–99)
Potassium: 3.9 meq/L (ref 3.5–5.1)
Sodium: 136 meq/L (ref 135–145)

## 2023-03-18 LAB — LIPID PANEL
Cholesterol: 85 mg/dL (ref 0–200)
HDL: 30.7 mg/dL — ABNORMAL LOW (ref >39.00)
LDL Cholesterol: 24 mg/dL (ref 0–99)
NonHDL: 54.44
Total CHOL/HDL Ratio: 3
Triglycerides: 151 mg/dL — ABNORMAL HIGH (ref 0.0–149.0)
VLDL: 30.2 mg/dL (ref 0.0–40.0)

## 2023-03-18 LAB — HEMOGLOBIN A1C: Hgb A1c MFr Bld: 8.9 % — ABNORMAL HIGH (ref 4.6–6.5)

## 2023-03-18 LAB — TSH: TSH: 2.98 u[IU]/mL (ref 0.35–5.50)

## 2023-03-18 MED ORDER — BREZTRI AEROSPHERE 160-9-4.8 MCG/ACT IN AERO: 2.0000 | INHALATION_SPRAY | Freq: Two times a day (BID) | RESPIRATORY_TRACT | 11 refills | Status: DC

## 2023-03-18 NOTE — Progress Notes (Signed)
Subjective:    Patient ID: Jordan Mcconnell, male    DOB: [birth year], 67 y.o.   MRN: [id number]  HPI DM- chronic problem, Metformin 1000mg  BID.  Last A1C 7%  UTD on eye exam, foot exam, microalbumin.  Pt has gained 8 lbs since July.  No symptomatic lows.  + numbness/tingling of feet.  HTN- chronic problem, on hydrochlorothiazide 25mg  daily w/ adequate control.  No CP.  Known SOB.  Denies HA's, visual changes, edema.  Hyperlipidemia- chronic problem, on Crestor 20mg  daily.  Denies abd pain, N/V.   Review of Systems For ROS see HPI     Objective:   Physical Exam Vitals reviewed.  Constitutional:      General: He is not in acute distress.    Appearance: Normal appearance. He is well-developed. He is obese. He is not ill-appearing.  HENT:     Head: Normocephalic and atraumatic.  Eyes:     Extraocular Movements: Extraocular movements intact.     Conjunctiva/sclera: Conjunctivae normal.     Pupils: Pupils are equal, round, and reactive to light.  Neck:     Thyroid: No thyromegaly.  Cardiovascular:     Rate and Rhythm: Normal rate and regular rhythm.     Pulses: Normal pulses.     Heart sounds: Normal heart sounds. No murmur heard. Pulmonary:     Effort: Pulmonary effort is normal. No respiratory distress.     Breath sounds: Normal breath sounds.  Abdominal:     General: Bowel sounds are normal. There is no distension.     Palpations: Abdomen is soft.  Musculoskeletal:     Cervical back: Normal range of motion and neck supple.     Right lower leg: No edema.     Left lower leg: No edema.  Lymphadenopathy:     Cervical: No cervical adenopathy.  Skin:    General: Skin is warm and dry.  Neurological:     General: No focal deficit present.     Mental Status: He is alert and oriented to person, place, and time.     Cranial Nerves: No cranial nerve deficit.  Psychiatric:        Mood and Affect: Mood normal.        Behavior: Behavior normal.           Assessment &  Plan:

## 2023-03-18 NOTE — Assessment & Plan Note (Signed)
Chronic problem.  On Metformin 1000mg  BID.  Most recent A1C 7%.  UTD on eye exam, foot exam, microalbumin.  + numbness of feet.  Check labs.  Adjust meds prn

## 2023-03-18 NOTE — Assessment & Plan Note (Signed)
Chronic problem.  On hydrochlorothiazide daily w/ adequate control.  Check labs due to diuretic use but no anticipated med changes.  Will follow.

## 2023-03-18 NOTE — Patient Instructions (Addendum)
Follow up in 3-4 months to recheck sugars We'll notify you of your lab results and make any changes if needed Continue to work on healthy diet and regular exercise- you can do it! RESTART the [facility] Call with any questions or concerns Stay Safe!  Stay Healthy! Happy Fall!!

## 2023-03-18 NOTE — Assessment & Plan Note (Signed)
Chronic problem.  On Crestor 20mg  daily.  Unfortunately he has gained 8 lbs since last visit.  Currently asymptomatic.  Check labs.  Adjust meds prn

## 2023-03-21 ENCOUNTER — Other Ambulatory Visit: Payer: Self-pay

## 2023-03-21 MED ORDER — EMPAGLIFLOZIN 10 MG PO TABS: 10.0000 mg | ORAL_TABLET | Freq: Every day | ORAL | 3 refills | Status: DC

## 2023-03-22 ENCOUNTER — Telehealth: Payer: Self-pay | Admitting: Family Medicine

## 2023-03-22 NOTE — Telephone Encounter (Signed)
Pt was returning a phone call from the office he received this morning, he asked for a return phone call

## 2023-03-22 NOTE — Telephone Encounter (Signed)
Spoke with patient yesterday for a result note. Did not call him this morning but I did return patient's call and gave updated information to him from Dr. Beverely Low.

## 2023-03-24 ENCOUNTER — Telehealth: Payer: Self-pay

## 2023-03-24 ENCOUNTER — Other Ambulatory Visit: Payer: Self-pay

## 2023-03-24 DIAGNOSIS — R7989 Other specified abnormal findings of blood chemistry: Secondary | ICD-10-CM

## 2023-03-24 NOTE — Telephone Encounter (Signed)
-----   Message from Neena Rhymes sent at 03/24/2023  7:28 AM EDT ----- I would continue the Crestor so we are not changing too many things at once.  The liver functions typically elevate when sugars are out of control and this is not dependent on the Crestor.  The goal is to really focus on low carb, healthy diet and regular exercise and we'll see what it looks like in 2 weeks

## 2023-03-24 NOTE — Progress Notes (Signed)
Spoke with pt and made lab only visit for 2 week Future lab was placed

## 2023-04-01 ENCOUNTER — Other Ambulatory Visit (HOSPITAL_COMMUNITY): Payer: Self-pay | Admitting: Student

## 2023-04-01 DIAGNOSIS — Z96651 Presence of right artificial knee joint: Secondary | ICD-10-CM

## 2023-04-05 ENCOUNTER — Other Ambulatory Visit: Payer: Self-pay

## 2023-04-05 ENCOUNTER — Other Ambulatory Visit (INDEPENDENT_AMBULATORY_CARE_PROVIDER_SITE_OTHER): Payer: PPO

## 2023-04-05 DIAGNOSIS — R7989 Other specified abnormal findings of blood chemistry: Secondary | ICD-10-CM | POA: Diagnosis not present

## 2023-04-05 DIAGNOSIS — Z96651 Presence of right artificial knee joint: Secondary | ICD-10-CM | POA: Diagnosis not present

## 2023-04-05 LAB — HEPATIC FUNCTION PANEL
ALT: 64 U/L — ABNORMAL HIGH (ref 0–53)
AST: 48 U/L — ABNORMAL HIGH (ref 0–37)
Albumin: 4.3 g/dL (ref 3.5–5.2)
Alkaline Phosphatase: 75 U/L (ref 39–117)
Bilirubin, Direct: 0.1 mg/dL (ref 0.0–0.3)
Total Bilirubin: 0.6 mg/dL (ref 0.2–1.2)
Total Protein: 6.8 g/dL (ref 6.0–8.3)

## 2023-04-05 LAB — SEDIMENTATION RATE: Sed Rate: 11 mm/h (ref 0–20)

## 2023-04-05 LAB — C-REACTIVE PROTEIN: CRP: <1 mg/dL (ref 0.5–20.0)

## 2023-04-06 ENCOUNTER — Encounter (HOSPITAL_COMMUNITY)
Admission: RE | Admit: 2023-04-06 | Discharge: 2023-04-06 | Disposition: A | Discharge status: Home / Self Care | Payer: PPO | Source: Ambulatory Visit | Attending: Student | Admitting: Student

## 2023-04-06 ENCOUNTER — Telehealth: Payer: Self-pay

## 2023-04-06 DIAGNOSIS — Z96651 Presence of right artificial knee joint: Secondary | ICD-10-CM | POA: Insufficient documentation

## 2023-04-06 MED ORDER — TECHNETIUM TC 99M MEDRONATE IV KIT
20.0000 | PACK | Freq: Once | INTRAVENOUS | Status: AC | PRN
  Administered 2023-04-06: 20 via INTRAVENOUS

## 2023-04-06 NOTE — Telephone Encounter (Signed)
-----   Message from Neena Rhymes sent at 04/06/2023  7:06 AM EDT ----- Liver functions are trending down.  This is good news.

## 2023-04-15 ENCOUNTER — Ambulatory Visit (HOSPITAL_BASED_OUTPATIENT_CLINIC_OR_DEPARTMENT_OTHER): Payer: PPO | Admitting: Pulmonary Disease

## 2023-04-15 DIAGNOSIS — R0602 Shortness of breath: Secondary | ICD-10-CM

## 2023-04-15 LAB — PULMONARY FUNCTION TEST
DL/VA % pred: 116 %
DL/VA: 4.82 ml/min/mmHg/L
DLCO cor % pred: 96 %
DLCO cor: 24.81 ml/min/mmHg
DLCO unc % pred: 96 %
DLCO unc: 24.95 ml/min/mmHg
FEF 25-75 Post: 4.15 L/s
FEF 25-75 Pre: 3.51 L/s
FEF2575-%Change-Post: 18 %
FEF2575-%Pred-Post: 160 %
FEF2575-%Pred-Pre: 135 %
FEV1-%Change-Post: 2 %
FEV1-%Pred-Post: 94 %
FEV1-%Pred-Pre: 92 %
FEV1-Post: 3.09 L
FEV1-Pre: 3.02 L
FEV1FVC-%Change-Post: 1 %
FEV1FVC-%Pred-Pre: 113 %
FEV6-%Change-Post: 0 %
FEV6-%Pred-Post: 86 %
FEV6-%Pred-Pre: 85 %
FEV6-Post: 3.6 L
FEV6-Pre: 3.58 L
FEV6FVC-%Pred-Post: 105 %
FEV6FVC-%Pred-Pre: 105 %
FVC-%Change-Post: 0 %
FVC-%Pred-Post: 81 %
FVC-%Pred-Pre: 81 %
FVC-Post: 3.6 L
FVC-Pre: 3.58 L
Post FEV1/FVC ratio: 86 %
Post FEV6/FVC ratio: 100 %
Pre FEV1/FVC ratio: 85 %
Pre FEV6/FVC Ratio: 100 %
RV % pred: 54 %
RV: 1.26 L
TLC % pred: 88 %
TLC: 6.02 L

## 2023-04-15 NOTE — Progress Notes (Signed)
Full PFT Performed Today  

## 2023-04-15 NOTE — Patient Instructions (Signed)
Full PFT Performed Today  

## 2023-05-26 ENCOUNTER — Other Ambulatory Visit: Payer: Self-pay | Admitting: Allergy

## 2023-05-27 ENCOUNTER — Other Ambulatory Visit: Payer: Self-pay | Admitting: Family Medicine

## 2023-06-17 ENCOUNTER — Ambulatory Visit (INDEPENDENT_AMBULATORY_CARE_PROVIDER_SITE_OTHER): Payer: PPO | Admitting: Family Medicine

## 2023-06-17 ENCOUNTER — Encounter: Payer: Self-pay | Admitting: Family Medicine

## 2023-06-17 VITALS — BP 124/70 | HR 71 | Temp 97.9°F | Ht 69.0 in | Wt 270.2 lb

## 2023-06-17 DIAGNOSIS — Z7984 Long term (current) use of oral hypoglycemic drugs: Secondary | ICD-10-CM | POA: Diagnosis not present

## 2023-06-17 DIAGNOSIS — Z23 Encounter for immunization: Secondary | ICD-10-CM

## 2023-06-17 DIAGNOSIS — E119 Type 2 diabetes mellitus without complications: Secondary | ICD-10-CM

## 2023-06-17 LAB — HEPATIC FUNCTION PANEL
ALT: 82 U/L — ABNORMAL HIGH (ref 0–53)
AST: 48 U/L — ABNORMAL HIGH (ref 0–37)
Albumin: 4.6 g/dL (ref 3.5–5.2)
Alkaline Phosphatase: 85 U/L (ref 39–117)
Bilirubin, Direct: 0.1 mg/dL (ref 0.0–0.3)
Total Bilirubin: 0.6 mg/dL (ref 0.2–1.2)
Total Protein: 7.5 g/dL (ref 6.0–8.3)

## 2023-06-17 LAB — BASIC METABOLIC PANEL
BUN: 19 mg/dL (ref 6–23)
CO2: 31 meq/L (ref 19–32)
Calcium: 9.8 mg/dL (ref 8.4–10.5)
Chloride: 97 meq/L (ref 96–112)
Creatinine, Ser: 0.76 mg/dL (ref 0.40–1.50)
GFR: 93.43 mL/min (ref >60.00)
Glucose, Bld: 150 mg/dL — ABNORMAL HIGH (ref 70–99)
Potassium: 4.7 meq/L (ref 3.5–5.1)
Sodium: 139 meq/L (ref 135–145)

## 2023-06-17 LAB — HEMOGLOBIN A1C: Hgb A1c MFr Bld: 8 % — ABNORMAL HIGH (ref 4.6–6.5)

## 2023-06-17 MED ORDER — EMPAGLIFLOZIN 10 MG PO TABS: 10.0000 mg | ORAL_TABLET | Freq: Every day | ORAL | 3 refills | Status: DC

## 2023-06-17 NOTE — Progress Notes (Signed)
   Subjective:    Patient ID: Jordan Mcconnell, male    DOB: 1956/04/13, 67 y.o.   MRN: [id number]  HPI DM- chronic problem.  Currently on Jardiance 10mg  daily.  UTD on foot exam, eye exam, microalbumin.  Pt is down 6 lbs since last visit.  Pt reports that if he doesn't eat after taking the Jardiance, he will have ~30 minutes of abd cramping.  No CP, SOB above baseline, visual changes, numbness/tingling hands/feet.  + stress HA    Review of Systems For ROS see HPI     Objective:   Physical Exam Vitals reviewed.  Constitutional:      General: He is not in acute distress.    Appearance: Normal appearance. He is well-developed. He is obese. He is not ill-appearing.  HENT:     Head: Normocephalic and atraumatic.  Eyes:     Extraocular Movements: Extraocular movements intact.     Conjunctiva/sclera: Conjunctivae normal.     Pupils: Pupils are equal, round, and reactive to light.  Neck:     Thyroid: No thyromegaly.  Cardiovascular:     Rate and Rhythm: Normal rate and regular rhythm.     Pulses: Normal pulses.     Heart sounds: Normal heart sounds. No murmur heard. Pulmonary:     Effort: Pulmonary effort is normal. No respiratory distress.     Breath sounds: Normal breath sounds.  Abdominal:     General: Bowel sounds are normal. There is no distension.     Palpations: Abdomen is soft.  Musculoskeletal:     Cervical back: Normal range of motion and neck supple.     Right lower leg: No edema.     Left lower leg: No edema.  Lymphadenopathy:     Cervical: No cervical adenopathy.  Skin:    General: Skin is warm and dry.  Neurological:     General: No focal deficit present.     Mental Status: He is alert and oriented to person, place, and time.     Cranial Nerves: No cranial nerve deficit.  Psychiatric:        Mood and Affect: Mood normal.        Behavior: Behavior normal.           Assessment & Plan:

## 2023-06-17 NOTE — Patient Instructions (Addendum)
Schedule your complete physical in 3-4 months We'll notify you of your lab results and make any changes if needed Continue to work on healthy diet and regular exercise- you can do it! Call with any questions or concerns Stay Safe!  Stay Healthy! Hang in there!! Happy Holidays!

## 2023-06-17 NOTE — Assessment & Plan Note (Signed)
Chronic problem.  Currently on Jardiance 10mg  daily.  UTD on foot exam, eye exam, microalbumin.  Encouraged him to eat regularly and exercise as he is able.  Check labs.  Adjust meds prn

## 2023-06-20 ENCOUNTER — Telehealth: Payer: Self-pay

## 2023-06-20 ENCOUNTER — Telehealth: Payer: Self-pay | Admitting: Family Medicine

## 2023-06-20 NOTE — Telephone Encounter (Signed)
Please advise Placed in folder at nurse station

## 2023-06-20 NOTE — Telephone Encounter (Signed)
Type of form received: Surgical Clearance  Additional comments: Pt scheduled for 08/01/23 at 9:20am  Received by: [employer] - Front Desk  Form should be Faxed/mailed to: (address/ fax #) [phone]  Is patient requesting call for pickup: No  Form placed:  Labeled & placed in provider bin  Attach charge sheet.  Provider will determine charge.  Individual made aware of 3-5 business day turn around? N/A

## 2023-06-20 NOTE — Telephone Encounter (Signed)
-----   Message from Neena Rhymes sent at 06/17/2023  4:41 PM EST ----- Your A1C is much better since starting the Jardiance- this is great news!  I would still like to see it less than 8 so continue to work on healthy food choices and regular exercise.  Your liver functions are again elevated due to the cholesterol medication but they are not so high that we are concerned.  We will continue to follow them at your future appts  No changes at this time

## 2023-06-20 NOTE — Telephone Encounter (Signed)
Pt has reviewed labs via MyChart

## 2023-06-27 NOTE — Telephone Encounter (Signed)
Surgical clearance appt needed between now and March

## 2023-06-27 NOTE — Telephone Encounter (Signed)
Pt has appt

## 2023-07-29 ENCOUNTER — Telehealth: Payer: Self-pay | Admitting: *Deleted

## 2023-07-29 ENCOUNTER — Telehealth: Payer: Self-pay | Admitting: Cardiovascular Disease

## 2023-07-29 NOTE — Telephone Encounter (Signed)
   Name: Jordan Mcconnell  DOB: 1955-10-27  MRN: [id number]  Primary Cardiologist: Armanda Magic, MD   Preoperative team, please contact this patient and set up a phone call appointment for further preoperative risk assessment. Please obtain consent and complete medication review. Thank you for your help.  I confirm that guidance regarding antiplatelet and oral anticoagulation therapy has been completed and, if necessary, noted below.  None  I also confirmed the patient resides in the state of [place]. As per [facility] Medical Board telemedicine laws, the patient must reside in the state in which the provider is licensed.   Napoleon Form, Leodis Rains, NP 07/29/2023, 11:52 AM [facility]

## 2023-07-29 NOTE — Telephone Encounter (Signed)
Patient scheduled 08/18/23

## 2023-07-29 NOTE — Telephone Encounter (Signed)
   Pre-operative Risk Assessment    Patient Name: Jordan Mcconnell  DOB: Sep 21, 1955 MRN: [id number]   Date of last office visit: 12/15/2022 Date of next office visit: none   Request for Surgical Clearance    Procedure:   right knee poly vs total knee revision  Date of Surgery:  Clearance 09/21/23                                Surgeon:  Dr. Ollen Gross Surgeon's Group or Practice Name:  [facility] Phone number:  [phone] Fax number:  [phone] [facility]   Type of Clearance Requested:   - Medical    Type of Anesthesia:   choice   Additional requests/questions:    SignedRoyann Shivers   07/29/2023, 11:45 AM

## 2023-07-29 NOTE — Telephone Encounter (Signed)
  Patient Consent for Virtual Visit        Jordan Mcconnell has provided verbal consent on 07/29/2023 for a virtual visit (video or telephone).   CONSENT FOR VIRTUAL VISIT FOR:  Jordan Mcconnell  By participating in this virtual visit I agree to the following:  I hereby voluntarily request, consent and authorize [facility] and its employed or contracted physicians, physician assistants, nurse practitioners or other licensed health care professionals (the Practitioner), to provide me with telemedicine health care services (the "Services") as deemed necessary by the treating Practitioner. I acknowledge and consent to receive the Services by the Practitioner via telemedicine. I understand that the telemedicine visit will involve communicating with the Practitioner through live audiovisual communication technology and the disclosure of certain medical information by electronic transmission. I acknowledge that I have been given the opportunity to request an in-person assessment or other available alternative prior to the telemedicine visit and am voluntarily participating in the telemedicine visit.  I understand that I have the right to withhold or withdraw my consent to the use of telemedicine in the course of my care at any time, without affecting my right to future care or treatment, and that the Practitioner or I may terminate the telemedicine visit at any time. I understand that I have the right to inspect all information obtained and/or recorded in the course of the telemedicine visit and may receive copies of available information for a reasonable fee.  I understand that some of the potential risks of receiving the Services via telemedicine include:  Delay or interruption in medical evaluation due to technological equipment failure or disruption; Information transmitted may not be sufficient (e.g. poor resolution of images) to allow for appropriate medical decision making by the  Practitioner; and/or  In rare instances, security protocols could fail, causing a breach of personal health information.  Furthermore, I acknowledge that it is my responsibility to provide information about my medical history, conditions and care that is complete and accurate to the best of my ability. I acknowledge that Practitioner's advice, recommendations, and/or decision may be based on factors not within their control, such as incomplete or inaccurate data provided by me or distortions of diagnostic images or specimens that may result from electronic transmissions. I understand that the practice of medicine is not an exact science and that Practitioner makes no warranties or guarantees regarding treatment outcomes. I acknowledge that a copy of this consent can be made available to me via my patient portal [facility] MyChart), or I can request a printed copy by calling the office of [facility].    I understand that my insurance will be billed for this visit.   I have read or had this consent read to me. I understand the contents of this consent, which adequately explains the benefits and risks of the Services being provided via telemedicine.  I have been provided ample opportunity to ask questions regarding this consent and the Services and have had my questions answered to my satisfaction. I give my informed consent for the services to be provided through the use of telemedicine in my medical care

## 2023-08-01 ENCOUNTER — Telehealth: Payer: Self-pay | Admitting: Pulmonary Disease

## 2023-08-01 ENCOUNTER — Ambulatory Visit: Payer: PPO | Admitting: Family Medicine

## 2023-08-01 NOTE — Telephone Encounter (Signed)
Fax received from Dr. Trudee Grip with [facility] to perform a rt knee poly vs total knee revision (choice anesthesia) on patient.  Patient needs surgery clearance. Surgery is 09/21/23. Patient was seen on 01/13/23. Office protocol is a risk assessment can be sent to surgeon if patient has been seen in 60 days or less.   Pt needs appt for clearance   I called pt and there was no answer- LMTCB

## 2023-08-01 NOTE — Telephone Encounter (Signed)
Patient has been notified that in order for [name] to clear him for surgery he will need the appt on 3/13. He has agreed.

## 2023-08-01 NOTE — Telephone Encounter (Signed)
PT is having a surgery on the 19th of March. His last AVS states he was to be seen by Dr. Everardo All for a PFT and FU. He says he has been given clearance by his other Dr. And asks if he really needs this appt. I told him we'd call him to let him know. His # is [phone]

## 2023-08-01 NOTE — Telephone Encounter (Signed)
Patient has an appt for PFT and FU with K. Cobb 3/13.

## 2023-08-09 ENCOUNTER — Other Ambulatory Visit: Payer: Self-pay | Admitting: Family Medicine

## 2023-08-12 NOTE — Telephone Encounter (Signed)
 NFN

## 2023-08-18 ENCOUNTER — Ambulatory Visit: Payer: PPO | Attending: Emergency Medicine | Admitting: Emergency Medicine

## 2023-08-18 DIAGNOSIS — Z01810 Encounter for preprocedural cardiovascular examination: Secondary | ICD-10-CM

## 2023-08-18 NOTE — Progress Notes (Signed)
Virtual Visit via Telephone Note   Because of Jordan Mcconnell's co-morbid illnesses, he is at least at moderate risk for complications without adequate follow up.  This format is felt to be most appropriate for this patient at this time.  The patient did not have access to video technology/had technical difficulties with video requiring transitioning to audio format only (telephone).  All issues noted in this document were discussed and addressed.  No physical exam could be performed with this format.  Please refer to the patient's chart for his consent to telehealth for [facility].  Evaluation Performed:  Preoperative cardiovascular risk assessment _____________   Date:  08/18/2023   Patient ID:  Jordan Mcconnell, DOB 1955/07/17, MRN [id number] Patient Location:  Home Provider location:   Office  Primary Care Provider:  Sheliah Hatch, MD Primary Cardiologist:  Armanda Magic, MD  Chief Complaint / Patient Profile   68 y.o. y/o male with a h/o type 2 diabetes, obesity, hypertension, obstructive sleep apnea, coronary artery disease, hyperlipidemia who is pending right knee poly vs total knee revision on 09/21/2023 with [facility] and presents today for telephonic preoperative cardiovascular risk assessment.  History of Present Illness    Jordan Mcconnell is a 68 y.o. male who presents via audio/video conferencing for a telehealth visit today.  Pt was last seen in cardiology clinic on 12/15/2022 by Dr. Flora Lipps.  At that time Darcella Gasman Kiernan was doing well.  The patient is now pending procedure as outlined above. Since his last visit, he denies chest pain, shortness of breath, lower extremity edema, fatigue, palpitations, melena, hematuria, hemoptysis, diaphoresis, weakness, presyncope, syncope, orthopnea, and PND.  Past Medical History    Past Medical History:  Diagnosis Date   Angiodysplasia of cecum    Arthritis    Asthma    Complication of anesthesia    one  surgery pt. states he was hard to wake up   Diabetes mellitus without complication (HCC)    type 2   GERD (gastroesophageal reflux disease)    HTN (hypertension)    Irritable bowel syndrome with diarrhea 05/12/2016   Obesity    OSA (obstructive sleep apnea)    uses CPAP   PONV (postoperative nausea and vomiting)    Past Surgical History:  Procedure Laterality Date   BACK SURGERY  08/2015   COLONOSCOPY     ESOPHAGEAL DILATION     1 or 2 times   ESOPHAGOGASTRODUODENOSCOPY N/A 10/30/2013   Procedure: ESOPHAGOGASTRODUODENOSCOPY (EGD);  Surgeon: Charolett Bumpers, MD;  Location: [facility];  Service: Endoscopy;  Laterality: N/A;   JOINT REPLACEMENT     bilateral knees   LEFT HEART CATH AND CORONARY ANGIOGRAPHY N/A 07/13/2018   Procedure: LEFT HEART CATH AND CORONARY ANGIOGRAPHY;  Surgeon: Corky Crafts, MD;  Location: [facility] INVASIVE CV LAB;  Service: Cardiovascular;  Laterality: N/A;   SHOULDER ARTHROSCOPY     Bilateral 3 times each shoulder   TOTAL HIP ARTHROPLASTY Left 04/16/2019   Procedure: TOTAL HIP ARTHROPLASTY ANTERIOR APPROACH;  Surgeon: Ollen Gross, MD;  Location: [facility];  Service: Orthopedics;  Laterality: Left;     Allergies  Allergies  Allergen Reactions   Penicillins Rash    DID THE REACTION INVOLVE: Swelling of the face/tongue/throat, SOB, or low BP? No Sudden or severe rash/hives, skin peeling, or the inside of the mouth or nose? Yes Did it require medical treatment? Yes When did it last happen?      childhood allergy  If all above answers are "NO", may proceed with cephalosporin use.     Sulfa Antibiotics Rash   Codeine Nausea Only   Tramadol Nausea Only    Severe dizziness    Home Medications    Prior to Admission medications   Medication Sig Start Date End Date Taking? Authorizing Provider  albuterol (VENTOLIN HFA) 108 (90 Base) MCG/ACT inhaler Inhale 2 puffs into the lungs every 6 (six) hours as needed for wheezing or shortness of breath.  01/13/23   Luciano Cutter, MD  APPLE CIDER VINEGAR PO Take by mouth.    [provider]  empagliflozin (JARDIANCE) 10 MG TABS tablet Take 1 tablet (10 mg total) by mouth daily before breakfast. 06/17/23   Sheliah Hatch, MD  hydrochlorothiazide (HYDRODIURIL) 25 MG tablet TAKE 1 TABLET (25 MG TOTAL) BY MOUTH DAILY. 08/09/23   Sheliah Hatch, MD  loratadine (CLARITIN) 10 MG tablet Take 10 mg by mouth daily.    [provider]  metFORMIN (GLUCOPHAGE) 1000 MG tablet Take 1 tablet (1,000 mg total) by mouth 2 (two) times daily with a meal. 12/16/22   Sheliah Hatch, MD  omeprazole (PRILOSEC) 40 MG capsule TAKE 1 CAPSULE (40 MG TOTAL) BY MOUTH DAILY. 05/27/23   Sheliah Hatch, MD  ondansetron (ZOFRAN-ODT) 4 MG disintegrating tablet Take 1 tablet (4 mg total) by mouth every 8 (eight) hours as needed (to stop post-prandial diarrhea). 01/12/23   Iva Boop, MD  rosuvastatin (CRESTOR) 20 MG tablet Take 1 tablet (20 mg total) by mouth daily. 12/23/22 07/29/23  Sande Rives, MD    Physical Exam    Vital Signs:  Jordan Mcconnell does not have vital signs available for review today.  Given telephonic nature of communication, physical exam is limited. AAOx3. NAD. Normal affect.  Speech and respirations are unlabored.  Accessory Clinical Findings    None  Assessment & Plan    1.  Preoperative Cardiovascular Risk Assessment: According to the Revised Cardiac Risk Index (RCRI), his Perioperative Risk of Major Cardiac Event is (%): 0.4. His Functional Capacity in METs is: 7.99 according to the Duke Activity Status Index (DASI). Therefore, based on ACC/AHA guidelines, patient would be at acceptable risk for the planned procedure without further cardiovascular testing.   The patient was advised that if he develops new symptoms prior to surgery to contact our office to arrange for a follow-up visit, and he verbalized understanding.  A copy of this note will be  routed to requesting surgeon.  Time:   Today, I have spent 8 minutes with the patient with telehealth technology discussing medical history, symptoms, and management plan.     Denyce Robert, NP  08/18/2023, 7:24 AM

## 2023-08-26 ENCOUNTER — Other Ambulatory Visit: Payer: Self-pay | Admitting: Family Medicine

## 2023-08-29 NOTE — H&P (Signed)
 KNEE ADMISSION H&P  Subjective:  Chief Complaint: Right knee pain.  HPI: Jordan Mcconnell, 68 y.o. male who presents for a follow-up visit due to right knee pain. The patient reports that the pain occurs primarily when walking, especially on uneven surfaces, and extends into his hip. He mentions that the pain has been present for approximately two months and worsens with activity. At night, the knee becomes swollen, making it difficult for him to find a comfortable position to sleep. The patient has a history of knee replacement surgery nearly 20 years ago. He recently had a bone scan, which came back normal. He also had blood work done, including an A1C check, but has not received the results yet. The patient has been using a brace, which provides some relief, but the pain persists. He is considering a polyethylene revision to address the instability and pain.  Patient Active Problem List   Diagnosis Date Noted   Hyperlipidemia associated with type 2 diabetes mellitus (HCC) 01/27/2022   Insomnia 10/28/2021   Seasonal and perennial allergic rhinitis 06/18/2021   Multiple drug allergies 06/03/2021   Chronic rhinitis 06/03/2021   Erectile dysfunction 04/01/2021   OA (osteoarthritis) of hip 04/16/2019   Dysphagia 09/18/2018   Lung nodules 09/18/2018   Chest pain 07/11/2018   Dyspnea 06/21/2018   Physical exam 07/22/2017   Adjustment disorder 03/17/2017   Diabetes mellitus, type II (HCC) 07/29/2016   Chronic cough 07/07/2016   Irritable bowel syndrome with diarrhea 05/12/2016   Gastroesophageal reflux disease 05/12/2016   Back pain 08/06/2015   Obstructive sleep apnea    HTN (hypertension)    Morbid obesity (HCC)     Past Medical History:  Diagnosis Date   Angiodysplasia of cecum    Arthritis    Asthma    Complication of anesthesia    one surgery pt. states he was hard to wake up   Diabetes mellitus without complication (HCC)    type 2   GERD (gastroesophageal reflux disease)     HTN (hypertension)    Irritable bowel syndrome with diarrhea 05/12/2016   Obesity    OSA (obstructive sleep apnea)    uses CPAP   PONV (postoperative nausea and vomiting)     Past Surgical History:  Procedure Laterality Date   BACK SURGERY  08/2015   COLONOSCOPY     ESOPHAGEAL DILATION     1 or 2 times   ESOPHAGOGASTRODUODENOSCOPY N/A 10/30/2013   Procedure: ESOPHAGOGASTRODUODENOSCOPY (EGD);  Surgeon: Charolett Bumpers, MD;  Location: [facility];  Service: Endoscopy;  Laterality: N/A;   JOINT REPLACEMENT     bilateral knees   LEFT HEART CATH AND CORONARY ANGIOGRAPHY N/A 07/13/2018   Procedure: LEFT HEART CATH AND CORONARY ANGIOGRAPHY;  Surgeon: Corky Crafts, MD;  Location: [facility] INVASIVE CV LAB;  Service: Cardiovascular;  Laterality: N/A;   SHOULDER ARTHROSCOPY     Bilateral 3 times each shoulder   TOTAL HIP ARTHROPLASTY Left 04/16/2019   Procedure: TOTAL HIP ARTHROPLASTY ANTERIOR APPROACH;  Surgeon: Ollen Gross, MD;  Location: [facility];  Service: Orthopedics;  Laterality: Left;     Prior to Admission medications   Medication Sig Start Date End Date Taking? Authorizing Provider  albuterol (VENTOLIN HFA) 108 (90 Base) MCG/ACT inhaler Inhale 2 puffs into the lungs every 6 (six) hours as needed for wheezing or shortness of breath. 01/13/23   Luciano Cutter, MD  APPLE CIDER VINEGAR PO Take by mouth.    [provider]  empagliflozin (JARDIANCE)  10 MG TABS tablet Take 1 tablet (10 mg total) by mouth daily before breakfast. 06/17/23   Sheliah Hatch, MD  hydrochlorothiazide (HYDRODIURIL) 25 MG tablet TAKE 1 TABLET (25 MG TOTAL) BY MOUTH DAILY. 08/09/23   Sheliah Hatch, MD  loratadine (CLARITIN) 10 MG tablet Take 10 mg by mouth daily.    [provider]  metFORMIN (GLUCOPHAGE) 1000 MG tablet Take 1 tablet (1,000 mg total) by mouth 2 (two) times daily with a meal. 12/16/22   Sheliah Hatch, MD  omeprazole (PRILOSEC) 40 MG capsule TAKE 1 CAPSULE  (40 MG TOTAL) BY MOUTH DAILY. 08/26/23   Sheliah Hatch, MD  ondansetron (ZOFRAN-ODT) 4 MG disintegrating tablet Take 1 tablet (4 mg total) by mouth every 8 (eight) hours as needed (to stop post-prandial diarrhea). 01/12/23   Iva Boop, MD  rosuvastatin (CRESTOR) 20 MG tablet Take 1 tablet (20 mg total) by mouth daily. 12/23/22 07/29/23  O'NealRonnald Ramp, MD    Allergies  Allergen Reactions   Penicillins Rash    DID THE REACTION INVOLVE: Swelling of the face/tongue/throat, SOB, or low BP? No Sudden or severe rash/hives, skin peeling, or the inside of the mouth or nose? Yes Did it require medical treatment? Yes When did it last happen?      childhood allergy If all above answers are "NO", may proceed with cephalosporin use.     Sulfa Antibiotics Rash   Codeine Nausea Only   Tramadol Nausea Only    Severe dizziness    Social History   Socioeconomic History   Marital status: Married    Spouse name: Not on file   Number of children: 2   Years of education: Not on file   Highest education level: Associate degree: occupational, Scientist, product/process development, or vocational program  Occupational History   Occupation: retired   Occupation: Retired Water quality scientist  Tobacco Use   Smoking status: Never   Smokeless tobacco: Never  Vaping Use   Vaping status: Never Used  Substance and Sexual Activity   Alcohol use: No   Drug use: No   Sexual activity: Yes  Other Topics Concern   Not on file  Social History Narrative   Not on file   Social Drivers of Health   Financial Resource Strain: Low Risk  (07/28/2023)   Overall Financial Resource Strain (CARDIA)    Difficulty of Paying Living Expenses: Not hard at all  Food Insecurity: No Food Insecurity (07/28/2023)   Hunger Vital Sign    Worried About Running Out of Food in the Last Year: Never true    Ran Out of Food in the Last Year: Never true  Transportation Needs: No Transportation Needs (07/28/2023)   PRAPARE - Scientist, research (physical sciences) (Medical): No    Lack of Transportation (Non-Medical): No  Physical Activity: Insufficiently Active (07/28/2023)   Exercise Vital Sign    Days of Exercise per Week: 3 days    Minutes of Exercise per Session: 30 min  Stress: Stress Concern Present (07/28/2023)   [employer] of Occupational Health - Occupational Stress Questionnaire    Feeling of Stress : Rather much  Social Connections: Socially Integrated (07/28/2023)   Social Connection and Isolation Panel [NHANES]    Frequency of Communication with Friends and Family: More than three times a week    Frequency of Social Gatherings with Friends and Family: More than three times a week    Attends Religious Services: More than 4 times per year  Active Member of Clubs or Organizations: Yes    Attends Banker Meetings: More than 4 times per year    Marital Status: Married  Catering manager Violence: Not At Risk (10/07/2022)   Humiliation, Afraid, Rape, and Kick questionnaire    Fear of Current or Ex-Partner: No    Emotionally Abused: No    Physically Abused: No    Sexually Abused: No    Tobacco Use: Low Risk  (08/18/2023)   Patient History    Smoking Tobacco Use: Never    Smokeless Tobacco Use: Never    Passive Exposure: Not on file   Social History   Substance and Sexual Activity  Alcohol Use No    Family History  Problem Relation Age of Onset   Memory loss Mother    Heart murmur Mother    Colon cancer Father    Stroke Father    Cancer Father        colon   Dementia Father    Asthma Maternal Grandmother    Memory loss Maternal Grandmother    Cancer Maternal Grandfather        lung and bone   Asthma Paternal Grandmother    Liver disease Neg Hx    Esophageal cancer Neg Hx     ROS  Objective:  Physical Exam: Well nourished and well developed.  General: Alert and oriented x3, cooperative and pleasant, no acute distress.  Head: normocephalic, atraumatic, neck supple.  Eyes:  EOMI. Abdomen: non-tender to palpation and soft, normoactive bowel sounds. Musculoskeletal: - Right knee shows no effusion. Range approximately 0-125 degrees. Slight varus valgus laxity in extension and less AP laxity in flexion. No gross instability. Walking with a slight limp. Calves soft and nontender. Motor function intact in LE. Strength 5/5 LE bilaterally. Neuro: Distal pulses 2+. Sensation to light touch intact in LE.  Vital signs in last 24 hours: BP: ()/()  Arterial Line BP: ()/()   Imaging Review - Radiographs from September 2024 demonstrate the prosthesis in excellent position with no periprosthetic abnormalities. This includes an AP view of both knees and a lateral view of the right knee.  - Bone scan reviewed from the [facility] system, showing no signs of loosening or infection.  Assessment/Plan:  Unstable right total knee  Jordan Mcconnell presents with right knee pain and symptomatic instability. The patient has a history of knee replacement surgery nearly 20 years ago. Recent radiographs and a bone scan demonstrate no signs of loosening or infection. The patient is experiencing effusion from the wear of the polyethylene, leading to instability and pain. There is no evidence of osteolysis or loosening of the components. The patient will benefit from a polyethylene revision to restore stability and reduce inflammation and pain. Risks and benefits reviewed with patient. The patient acknowledged the explanation, agreed to proceed with the plan and consent was signed. Patient is being admitted for inpatient treatment for surgery, pain control, PT, OT, prophylactic antibiotics, VTE prophylaxis, progressive ambulation and ADLs and discharge planning. The patient is planning to be discharged  home .  Anticipated LOS equal to or greater than 2 midnights due to - Age 17 and older with one or more of the following:  - Obesity  - Expected need for hospital services (PT, OT, Nursing) required  for safe  discharge  - Anticipated need for postoperative skilled nursing care or inpatient rehab  - Active co-morbidities: Diabetes and Coronary Artery Disease OR   - Unanticipated findings during/Post Surgery: Slow post-op progression:  GI, pain control, mobility  - Patient is a high risk of re-admission due to: None  Therapy Plans: [facility] [place] Disposition: Home with Wife Planned DVT Prophylaxis: Aspirin 81 mg BID DME Needed: None PCP: Mechele Collin, MD (requesting cardiac and pulm clearance) Cardiologist: Flora Lipps, MD (clearance received) Pulmonologist: Micheline Maze, NP (appt 03/13) TXA: IV Allergies: codeine (nausea), tramadol (nausea), penicillin (rash) Anesthesia Concerns: N/V, difficulty awakening, uses CPAP BMI: 39.9 Last HgbA1c: unsure - will recheck with pre-op labs  Pharmacy: [facility], [place])  Other: -Discussed dilaudid post-op  - Patient was instructed on what medications to stop prior to surgery. - Follow-up visit in 2 weeks with Dr. Lequita Halt - Begin physical therapy following surgery - Pre-operative lab work as pre-surgical testing - Prescriptions will be provided in hospital at time of discharge  R. Arcola Jansky, PA-C Orthopedic Surgery [facility] [place]

## 2023-08-30 NOTE — Telephone Encounter (Signed)
 Copied from CRM [id number]. Topic: General - Other >> Aug 30, 2023 12:37 PM Eunice Blase wrote: Reason for CRM: Pt returning call to schedule appt. Please did not want to provide me any information and only wants to speak with nurse. Please call pt [phone]

## 2023-09-07 NOTE — Progress Notes (Addendum)
 Anesthesia Review:  PCP: Neena Rhymes LOV 06/17/23  Cardiologist : Rise Paganini, NP telephone visit on 08/18/23  Pulmonology0 [place] LVO 01/13/23   PPM/ ICD: Device Orders: Rep Notified:  Chest x-ray : EKG : 12/15/22  Echo : 12/24/22  CT Cors- 12/26/22  Stress test: 2010  Cardiac Cath :  2020   Activity level: can do a flight of stairs without difficulty  Sleep Study/ CPAP : ghas cpap  Fasting Blood Sugar :      / Checks Blood Sugar -- times a day:     DM- type 2- does not check glucose at home  Hgba1c-  09/12/23-  8.8 - routed to DR Aluisio on 09/12/23.  Metformin- none day of surgery  Jardiance - Hld for 72 hours prior to procedure.  Last dose on 09/17/23   Blood Thinner/ Instructions /Last Dose: ASA / Instructions/ Last Dose :    Blood pressure was 147/97 at preop .  PT has not taken am meds nor eaten breakfast.  PT denies any chest pain, shortness of breath, dizziness or headache or blurred vision.  Wife with pt at preop .  PT to leave preop and eat and takes meds.

## 2023-09-08 ENCOUNTER — Telehealth: Payer: Self-pay | Admitting: Cardiology

## 2023-09-08 NOTE — Telephone Encounter (Signed)
   Pre-operative Risk Assessment    Patient Name: Jordan Mcconnell  DOB: 1955-09-26 MRN: [id number]   Date of last office visit: 12/15/2022 Date of next office visit: none   Request for Surgical Clearance    Procedure:   right poly vs total knee arthroplasty  revision  Date of Surgery:  Clearance 11/23/23                                Surgeon:  Dr. Ollen Gross Surgeon's Group or Practice Name:  [facility] Phone number:  [phone] Fax number:  [phone]  [place]   Type of Clearance Requested:   - Medical    Type of Anesthesia:  Not Indicated   Additional requests/questions:    Sharen Hones   09/08/2023, 10:20 AM

## 2023-09-09 NOTE — Patient Instructions (Signed)
 SURGICAL WAITING ROOM VISITATION  Patients having surgery or a procedure may have no more than 2 support people in the waiting area - these visitors may rotate.    Children under the age of 4 must have an adult with them who is not the patient.  Due to an increase in RSV and influenza rates and associated hospitalizations, children ages 76 and under may not visit patients in [facility] hospitals.  Visitors with respiratory illnesses are discouraged from visiting and should remain at home.  If the patient needs to stay at the hospital during part of their recovery, the visitor guidelines for inpatient rooms apply. Pre-op nurse will coordinate an appropriate time for 1 support person to accompany patient in pre-op.  This support person may not rotate.    Please refer to the [facility] website for the visitor guidelines for Inpatients (after your surgery is over and you are in a regular room).       Your procedure is scheduled on:  09/21/2023    Report to [facility] Main Entrance    Report to admitting at  0945 AM   Call this number if you have problems the morning of surgery [phone]   Do not eat food :After Midnight.   After Midnight you may have the following liquids until _ 0915_____ AM  DAY OF SURGERY  Water Non-Citrus Juices (without pulp, NO RED-Apple, White grape, White cranberry) Black Coffee (NO MILK/CREAM OR CREAMERS, sugar ok)  Clear Tea (NO MILK/CREAM OR CREAMERS, sugar ok) regular and decaf                             Plain Jell-O (NO RED)                                           Fruit ices (not with fruit pulp, NO RED)                                     Popsicles (NO RED)                                                               Sports drinks like Gatorade (NO RED)                   The day of surgery:  Drink ONE (1) Pre-Surgery Clear Ensure or G2 at 0915 AM  ( have completed by ) the morning of surgery. Drink in one sitting. Do not sip.   This drink was given to you during your hospital  pre-op appointment visit. Nothing else to drink after completing the  Pre-Surgery Clear Ensure or G2.          If you have questions, please contact your surgeon's office.      Oral Hygiene is also important to reduce your risk of infection.  Remember - BRUSH YOUR TEETH THE MORNING OF SURGERY WITH YOUR REGULAR TOOTHPASTE  DENTURES WILL BE REMOVED PRIOR TO SURGERY PLEASE DO NOT APPLY "Poly grip" OR ADHESIVES!!!   Do NOT smoke after Midnight   Stop all vitamins and herbal supplements 7 days before surgery.   Take these medicines the morning of surgery with A SIP OF WATER:  inhalers as usual and bring, claritin, omeprazole               Metformin- none am of surgery             Jardiance- Hold for 72 hours prior to surgery Last dose on 09/17/2023   DO NOT TAKE ANY ORAL DIABETIC MEDICATIONS DAY OF YOUR SURGERY  Bring CPAP mask and tubing day of surgery.                              You may not have any metal on your body including hair pins, jewelry, and body piercing             Do not wear make-up, lotions, powders, perfumes/cologne, or deodorant  Do not wear nail polish including gel and S&S, artificial/acrylic nails, or any other type of covering on natural nails including finger and toenails. If you have artificial nails, gel coating, etc. that needs to be removed by a nail salon please have this removed prior to surgery or surgery may need to be canceled/ delayed if the surgeon/ anesthesia feels like they are unable to be safely monitored.   Do not shave  48 hours prior to surgery.               Men may shave face and neck.   Do not bring valuables to the hospital. [facility] IS NOT             RESPONSIBLE   FOR VALUABLES.   Contacts, glasses, dentures or bridgework may not be worn into surgery.   Bring small overnight bag day of surgery.   DO NOT BRING YOUR HOME MEDICATIONS TO THE  HOSPITAL. PHARMACY WILL DISPENSE MEDICATIONS LISTED ON YOUR MEDICATION LIST TO YOU DURING YOUR ADMISSION IN THE HOSPITAL!    Patients discharged on the day of surgery will not be allowed to drive home.  Someone NEEDS to stay with you for the first 24 hours after anesthesia.   Special Instructions: Bring a copy of your healthcare power of attorney and living will documents the day of surgery if you haven't scanned them before.              Please read over the following fact sheets you were given: IF YOU HAVE QUESTIONS ABOUT YOUR PRE-OP INSTRUCTIONS PLEASE CALL [phone]   If you received a COVID test during your pre-op visit  it is requested that you wear a mask when out in public, stay away from anyone that may not be feeling well and notify your surgeon if you develop symptoms. If you test positive for Covid or have been in contact with anyone that has tested positive in the last 10 days please notify you surgeon.      Pre-operative 5 CHG Bath Instructions   You can play a key role in reducing the risk of infection after surgery. Your skin needs to be as free of germs as possible. You can reduce the number of germs on your skin by washing with CHG (chlorhexidine gluconate) soap before surgery.  CHG is an antiseptic soap that kills germs and continues to kill germs even after washing.   DO NOT use if you have an allergy to chlorhexidine/CHG or antibacterial soaps. If your skin becomes reddened or irritated, stop using the CHG and notify one of our RNs at [phone].   Please shower with the CHG soap starting 4 days before surgery using the following schedule:     Please keep in mind the following:  DO NOT shave, including legs and underarms, starting the day of your first shower.   You may shave your face at any point before/day of surgery.  Place clean sheets on your bed the day you start using CHG soap. Use a clean washcloth (not used since being washed) for each shower. DO NOT  sleep with pets once you start using the CHG.   CHG Shower Instructions:  If you choose to wash your hair and private area, wash first with your normal shampoo/soap.  After you use shampoo/soap, rinse your hair and body thoroughly to remove shampoo/soap residue.  Turn the water OFF and apply about 3 tablespoons (45 ml) of CHG soap to a CLEAN washcloth.  Apply CHG soap ONLY FROM YOUR NECK DOWN TO YOUR TOES (washing for 3-5 minutes)  DO NOT use CHG soap on face, private areas, open wounds, or sores.  Pay special attention to the area where your surgery is being performed.  If you are having back surgery, having someone wash your back for you may be helpful. Wait 2 minutes after CHG soap is applied, then you may rinse off the CHG soap.  Pat dry with a clean towel  Put on clean clothes/pajamas   If you choose to wear lotion, please use ONLY the CHG-compatible lotions on the back of this paper.     Additional instructions for the day of surgery: DO NOT APPLY any lotions, deodorants, cologne, or perfumes.   Put on clean/comfortable clothes.  Brush your teeth.  Ask your nurse before applying any prescription medications to the skin.      CHG Compatible Lotions   Aveeno Moisturizing lotion  Cetaphil Moisturizing Cream  Cetaphil Moisturizing Lotion  Clairol Herbal Essence Moisturizing Lotion, Dry Skin  Clairol Herbal Essence Moisturizing Lotion, Extra Dry Skin  Clairol Herbal Essence Moisturizing Lotion, Normal Skin  Curel Age Defying Therapeutic Moisturizing Lotion with Alpha Hydroxy  Curel Extreme Care Body Lotion  Curel Soothing Hands Moisturizing Hand Lotion  Curel Therapeutic Moisturizing Cream, Fragrance-Free  Curel Therapeutic Moisturizing Lotion, Fragrance-Free  Curel Therapeutic Moisturizing Lotion, Original Formula  Eucerin Daily Replenishing Lotion  Eucerin Dry Skin Therapy Plus Alpha Hydroxy Crme  Eucerin Dry Skin Therapy Plus Alpha Hydroxy Lotion  Eucerin Original  Crme  Eucerin Original Lotion  Eucerin Plus Crme Eucerin Plus Lotion  Eucerin TriLipid Replenishing Lotion  Keri Anti-Bacterial Hand Lotion  Keri Deep Conditioning Original Lotion Dry Skin Formula Softly Scented  Keri Deep Conditioning Original Lotion, Fragrance Free Sensitive Skin Formula  Keri Lotion Fast Absorbing Fragrance Free Sensitive Skin Formula  Keri Lotion Fast Absorbing Softly Scented Dry Skin Formula  Keri Original Lotion  Keri Skin Renewal Lotion Keri Silky Smooth Lotion  Keri Silky Smooth Sensitive Skin Lotion  Nivea Body Creamy Conditioning Oil  Nivea Body Extra Enriched Teacher, adult education Moisturizing Lotion Nivea Crme  Nivea Skin Firming Lotion  NutraDerm 30 Skin Lotion  NutraDerm Skin Lotion  NutraDerm Therapeutic Skin Cream  NutraDerm Therapeutic Skin Lotion  ProShield Protective  Hand Cream  Provon moisturizing lotion

## 2023-09-12 ENCOUNTER — Encounter (HOSPITAL_COMMUNITY): Payer: Self-pay

## 2023-09-12 ENCOUNTER — Encounter (HOSPITAL_COMMUNITY)
Admission: RE | Admit: 2023-09-12 | Discharge: 2023-09-12 | Disposition: A | Discharge status: Home / Self Care | Payer: PPO | Source: Ambulatory Visit | Attending: Orthopedic Surgery | Admitting: Orthopedic Surgery

## 2023-09-12 ENCOUNTER — Other Ambulatory Visit: Payer: Self-pay

## 2023-09-12 VITALS — BP 147/97 | HR 77 | Temp 98.2°F | Resp 16 | Ht 69.0 in | Wt 253.0 lb

## 2023-09-12 DIAGNOSIS — E119 Type 2 diabetes mellitus without complications: Secondary | ICD-10-CM | POA: Diagnosis not present

## 2023-09-12 DIAGNOSIS — Z01818 Encounter for other preprocedural examination: Secondary | ICD-10-CM | POA: Diagnosis not present

## 2023-09-12 HISTORY — DX: Malignant (primary) neoplasm, unspecified: C80.1

## 2023-09-12 LAB — BASIC METABOLIC PANEL
Anion gap: 11 (ref 5–15)
BUN: 21 mg/dL (ref 8–23)
CO2: 26 mmol/L (ref 22–32)
Calcium: 9.6 mg/dL (ref 8.9–10.3)
Chloride: 101 mmol/L (ref 98–111)
Creatinine, Ser: 0.85 mg/dL (ref 0.61–1.24)
GFR, Estimated: >60 mL/min (ref >60)
Glucose, Bld: 170 mg/dL — ABNORMAL HIGH (ref 70–99)
Potassium: 4.2 mmol/L (ref 3.5–5.1)
Sodium: 138 mmol/L (ref 135–145)

## 2023-09-12 LAB — HEMOGLOBIN A1C
Hgb A1c MFr Bld: 8.8 % — ABNORMAL HIGH (ref 4.8–5.6)
Mean Plasma Glucose: 205.86 mg/dL

## 2023-09-12 LAB — SURGICAL PCR SCREEN
MRSA, PCR: NEGATIVE
Staphylococcus aureus: NEGATIVE

## 2023-09-12 LAB — CBC
HCT: 51 % (ref 39.0–52.0)
Hemoglobin: 16.5 g/dL (ref 13.0–17.0)
MCH: 27.7 pg (ref 26.0–34.0)
MCHC: 32.4 g/dL (ref 30.0–36.0)
MCV: 85.7 fL (ref 80.0–100.0)
Platelets: 211 10*3/uL (ref 150–400)
RBC: 5.95 MIL/uL — ABNORMAL HIGH (ref 4.22–5.81)
RDW: 13.9 % (ref 11.5–15.5)
WBC: 11.1 10*3/uL — ABNORMAL HIGH (ref 4.0–10.5)
nRBC: 0 % (ref 0.0–0.2)

## 2023-09-12 LAB — GLUCOSE, CAPILLARY: Glucose-Capillary: 175 mg/dL — ABNORMAL HIGH (ref 70–99)

## 2023-09-13 ENCOUNTER — Telehealth: Payer: Self-pay | Admitting: Family Medicine

## 2023-09-13 NOTE — Telephone Encounter (Signed)
 Pt was scheduled to have ortho surgery on 3/19. He had labs drawn at [facility] yesterday and based on the results, his surgery has been canceled because his A1C is currently 8.8 and they want it to be under 7.0 in order to safely perform the surgery. He wants to know what he should do moving forward and how he can get his A1C down. I suggested scheduling an appt but he wanted me to run it by Dr. Beverely Low first.

## 2023-09-13 NOTE — Telephone Encounter (Signed)
Patient has been advised and voiced understanding

## 2023-09-13 NOTE — Telephone Encounter (Signed)
 Patient is looking for advice to help lower A1C for ortho surgery. Please advise.

## 2023-09-13 NOTE — Telephone Encounter (Signed)
 If they want his numbers to be <7%, we will definitely need to schedule an appt to discuss how to proceed.  That's an aggressive drop and we would need to talk about the various options that could possibly get [name] there.  In the meantime, he needs to make sure he is taking the Metformin and the Jardiance regularly

## 2023-09-15 ENCOUNTER — Ambulatory Visit: Payer: PPO | Admitting: Nurse Practitioner

## 2023-09-16 ENCOUNTER — Encounter: Payer: PPO | Admitting: Family Medicine

## 2023-09-16 NOTE — Telephone Encounter (Signed)
 Pt cancelled the appt for 3.13.25   Surgical Clearance. (Dr. Javier Docker to see him for FU and PFT. PFT had to be canceled and PT wondering why PFT anf FU when he is doing fine.- TO)   Made On: Change Notes: Confirmed: Canceled: 08/01/2023 10:11 AM 08/23/2023 1:59 PM 09/08/2023 10:01 AM 09/12/2023 4:57 PM By: By: By: By: Trey Paula CADENCE, BACKGROUND HARVELL, KIMBERLY C  Cancel Rsn: Patient (cx per patient due to cancelled surgery)

## 2023-09-19 ENCOUNTER — Emergency Department (HOSPITAL_BASED_OUTPATIENT_CLINIC_OR_DEPARTMENT_OTHER)

## 2023-09-19 ENCOUNTER — Other Ambulatory Visit (HOSPITAL_BASED_OUTPATIENT_CLINIC_OR_DEPARTMENT_OTHER): Payer: Self-pay

## 2023-09-19 ENCOUNTER — Ambulatory Visit: Payer: Self-pay | Admitting: Family Medicine

## 2023-09-19 ENCOUNTER — Emergency Department (HOSPITAL_BASED_OUTPATIENT_CLINIC_OR_DEPARTMENT_OTHER)
Admission: EM | Admit: 2023-09-19 | Discharge: 2023-09-19 | Disposition: A | Discharge status: Home / Self Care | Attending: Emergency Medicine | Admitting: Emergency Medicine

## 2023-09-19 ENCOUNTER — Other Ambulatory Visit: Payer: Self-pay

## 2023-09-19 DIAGNOSIS — R1032 Left lower quadrant pain: Secondary | ICD-10-CM | POA: Insufficient documentation

## 2023-09-19 DIAGNOSIS — R102 Pelvic and perineal pain: Secondary | ICD-10-CM | POA: Diagnosis not present

## 2023-09-19 DIAGNOSIS — R1031 Right lower quadrant pain: Secondary | ICD-10-CM | POA: Insufficient documentation

## 2023-09-19 DIAGNOSIS — I10 Essential (primary) hypertension: Secondary | ICD-10-CM | POA: Diagnosis not present

## 2023-09-19 DIAGNOSIS — E119 Type 2 diabetes mellitus without complications: Secondary | ICD-10-CM | POA: Insufficient documentation

## 2023-09-19 DIAGNOSIS — R1084 Generalized abdominal pain: Secondary | ICD-10-CM

## 2023-09-19 DIAGNOSIS — Z79899 Other long term (current) drug therapy: Secondary | ICD-10-CM | POA: Insufficient documentation

## 2023-09-19 DIAGNOSIS — R509 Fever, unspecified: Secondary | ICD-10-CM | POA: Insufficient documentation

## 2023-09-19 DIAGNOSIS — R109 Unspecified abdominal pain: Secondary | ICD-10-CM | POA: Diagnosis not present

## 2023-09-19 DIAGNOSIS — D72829 Elevated white blood cell count, unspecified: Secondary | ICD-10-CM | POA: Diagnosis not present

## 2023-09-19 DIAGNOSIS — Z7984 Long term (current) use of oral hypoglycemic drugs: Secondary | ICD-10-CM | POA: Insufficient documentation

## 2023-09-19 DIAGNOSIS — K5730 Diverticulosis of large intestine without perforation or abscess without bleeding: Secondary | ICD-10-CM | POA: Diagnosis not present

## 2023-09-19 LAB — URINALYSIS, ROUTINE W REFLEX MICROSCOPIC
Bacteria, UA: NONE SEEN
Bilirubin Urine: NEGATIVE
Glucose, UA: 100 mg/dL — AB
Hgb urine dipstick: NEGATIVE
Ketones, ur: >80 mg/dL — AB
Leukocytes,Ua: NEGATIVE
Nitrite: NEGATIVE
Protein, ur: 30 mg/dL — AB
Specific Gravity, Urine: >1.046 — ABNORMAL HIGH (ref 1.005–1.030)
pH: 6.5 (ref 5.0–8.0)

## 2023-09-19 LAB — RESP PANEL BY RT-PCR (RSV, FLU A&B, COVID)  RVPGX2
Influenza A by PCR: NEGATIVE
Influenza B by PCR: NEGATIVE
Resp Syncytial Virus by PCR: NEGATIVE
SARS Coronavirus 2 by RT PCR: NEGATIVE

## 2023-09-19 LAB — COMPREHENSIVE METABOLIC PANEL
ALT: 27 U/L (ref 0–44)
AST: 20 U/L (ref 15–41)
Albumin: 4.5 g/dL (ref 3.5–5.0)
Alkaline Phosphatase: 56 U/L (ref 38–126)
Anion gap: 16 — ABNORMAL HIGH (ref 5–15)
BUN: 11 mg/dL (ref 8–23)
CO2: 21 mmol/L — ABNORMAL LOW (ref 22–32)
Calcium: 9.7 mg/dL (ref 8.9–10.3)
Chloride: 95 mmol/L — ABNORMAL LOW (ref 98–111)
Creatinine, Ser: 0.74 mg/dL (ref 0.61–1.24)
GFR, Estimated: >60 mL/min (ref >60)
Glucose, Bld: 148 mg/dL — ABNORMAL HIGH (ref 70–99)
Potassium: 3.1 mmol/L — ABNORMAL LOW (ref 3.5–5.1)
Sodium: 132 mmol/L — ABNORMAL LOW (ref 135–145)
Total Bilirubin: 0.6 mg/dL (ref 0.0–1.2)
Total Protein: 7.7 g/dL (ref 6.5–8.1)

## 2023-09-19 LAB — CBC
HCT: 48 % (ref 39.0–52.0)
Hemoglobin: 16.5 g/dL (ref 13.0–17.0)
MCH: 28 pg (ref 26.0–34.0)
MCHC: 34.4 g/dL (ref 30.0–36.0)
MCV: 81.4 fL (ref 80.0–100.0)
Platelets: 175 10*3/uL (ref 150–400)
RBC: 5.9 MIL/uL — ABNORMAL HIGH (ref 4.22–5.81)
RDW: 13.9 % (ref 11.5–15.5)
WBC: 11.7 10*3/uL — ABNORMAL HIGH (ref 4.0–10.5)
nRBC: 0 % (ref 0.0–0.2)

## 2023-09-19 LAB — LACTIC ACID, PLASMA: Lactic Acid, Venous: 0.9 mmol/L (ref 0.5–1.9)

## 2023-09-19 LAB — LIPASE, BLOOD: Lipase: 12 U/L (ref 11–51)

## 2023-09-19 MED ORDER — POTASSIUM CHLORIDE CRYS ER 20 MEQ PO TBCR
40.0000 meq | EXTENDED_RELEASE_TABLET | Freq: Once | ORAL | Status: AC
  Administered 2023-09-19: 40 meq via ORAL
  Filled 2023-09-19: qty 2

## 2023-09-19 MED ORDER — DICYCLOMINE HCL 20 MG PO TABS
20.0000 mg | ORAL_TABLET | Freq: Two times a day (BID) | ORAL | 0 refills | Status: DC
  Filled 2023-09-19: qty 20, 10d supply, fill #0

## 2023-09-19 MED ORDER — IOHEXOL 300 MG/ML  SOLN
100.0000 mL | Freq: Once | INTRAMUSCULAR | Status: AC | PRN
  Administered 2023-09-19: 100 mL via INTRAVENOUS

## 2023-09-19 MED ORDER — SODIUM CHLORIDE 0.9 % IV BOLUS
1000.0000 mL | Freq: Once | INTRAVENOUS | Status: AC
  Administered 2023-09-19: 1000 mL via INTRAVENOUS

## 2023-09-19 MED ORDER — ONDANSETRON HCL 4 MG/2ML IJ SOLN
4.0000 mg | Freq: Once | INTRAMUSCULAR | Status: AC
  Administered 2023-09-19: 4 mg via INTRAVENOUS
  Filled 2023-09-19: qty 2

## 2023-09-19 MED ORDER — MORPHINE SULFATE (PF) 4 MG/ML IV SOLN
4.0000 mg | Freq: Once | INTRAVENOUS | Status: AC
  Administered 2023-09-19: 4 mg via INTRAVENOUS
  Filled 2023-09-19: qty 1

## 2023-09-19 NOTE — Discharge Instructions (Addendum)
 Please read and follow all provided instructions.  Your diagnoses today include:  1. Febrile illness   2. Generalized abdominal pain     Tests performed today include: Blood cell counts and platelets: Mildly elevated white blood cell count Complete metabolic panel: Normal liver & kidney function, slightly low potassium, sodium, chloride likely in part related to dehydration Pancreas function test (called lipase): Was normal Urine test to look for infection: No infection, suggest dehydration CT scan of your abdomen pelvis: Not show any acute findings which would explain your recent fever Vital signs. See below for your results today.   Medications prescribed:  Bentyl - medication for intestinal cramps and spasms  Take any prescribed medications only as directed.  Home care instructions:  Follow any educational materials contained in this packet.  Follow-up instructions: Please follow-up with your primary care provider in the next 3 days for further evaluation of your symptoms.    Return instructions:  SEEK IMMEDIATE MEDICAL ATTENTION IF: The pain does not go away or becomes severe  A temperature above 101F develops  Repeated vomiting occurs (multiple episodes)  The pain becomes localized to portions of the abdomen. The right side could possibly be appendicitis. In an adult, the left lower portion of the abdomen could be colitis or diverticulitis.  Blood is being passed in stools or vomit (bright red or black tarry stools)  You develop chest pain, difficulty breathing, dizziness or fainting, or become confused, poorly responsive, or inconsolable (young children) If you have any other emergent concerns regarding your health  Additional Information: Abdominal (belly) pain can be caused by many things. Your caregiver performed an examination and possibly ordered blood/urine tests and imaging (CT scan, x-rays, ultrasound). Many cases can be observed and treated at home after initial  evaluation in the emergency department. Even though you are being discharged home, abdominal pain can be unpredictable. Therefore, you need a repeated exam if your pain does not resolve, returns, or worsens. Most patients with abdominal pain don't have to be admitted to the hospital or have surgery, but serious problems like appendicitis and gallbladder attacks can start out as nonspecific pain. Many abdominal conditions cannot be diagnosed in one visit, so follow-up evaluations are very important.  Your vital signs today were: BP 134/65   Pulse 81   Temp 98.4 F (36.9 C) (Oral)   Resp 15   Ht 5\' 9"  (1.753 m)   Wt 114 kg   SpO2 97%   BMI 37.11 kg/m  If your blood pressure (bp) was elevated above 135/85 this visit, please have this repeated by your doctor within one month. --------------

## 2023-09-19 NOTE — ED Provider Notes (Addendum)
 [place] EMERGENCY DEPARTMENT AT [facility] Provider Note   CSN: [id number] Arrival date & time: 09/19/23  0844     History  Chief Complaint  Patient presents with   Emesis    Jordan Mcconnell is a 68 y.o. male.  Patient with history of diabetes, hypertension, high cholesterol, orthopedic surgeries and no abdominal surgeries --presents to the emergency department for evaluation of abdominal pain, fever, increasing watery stool with nausea over the past 48 hours.  Symptoms have been constant, temperature to 103 F last night.  Stool is nonbloody.  No known sick contacts.  No chest pain or shortness of breath.  He does have an occasional cough.  No other URI symptoms.  Pain is worse with movement.  He feels unsteady on his feet and states that he feels dehydrated.  The pain in the abdomen feels like "someone is wringing out a washcloth".       Home Medications Prior to Admission medications   Medication Sig Start Date End Date Taking? Authorizing Provider  albuterol (VENTOLIN HFA) 108 (90 Base) MCG/ACT inhaler Inhale 2 puffs into the lungs every 6 (six) hours as needed for wheezing or shortness of breath. 01/13/23   Luciano Cutter, MD  APPLE CIDER VINEGAR PO Take 1,200 mg by mouth 2 (two) times daily.    [provider]  Berberine Chloride (BERBERINE HCI PO) Take 1,200 mg by mouth 2 (two) times daily.    [provider]  CHROMIUM PO Take 500 mg by mouth every evening.    [provider]  empagliflozin (JARDIANCE) 10 MG TABS tablet Take 1 tablet (10 mg total) by mouth daily before breakfast. 06/17/23   Sheliah Hatch, MD  hydrochlorothiazide (HYDRODIURIL) 25 MG tablet TAKE 1 TABLET (25 MG TOTAL) BY MOUTH DAILY. 08/09/23   Sheliah Hatch, MD  loratadine (CLARITIN) 10 MG tablet Take 10 mg by mouth every evening.    [provider]  metFORMIN (GLUCOPHAGE) 1000 MG tablet Take 1 tablet (1,000 mg total) by mouth 2 (two) times daily  with a meal. 12/16/22   Sheliah Hatch, MD  montelukast (SINGULAIR) 10 MG tablet Take 10 mg by mouth every evening.    [provider]  omeprazole (PRILOSEC) 40 MG capsule TAKE 1 CAPSULE (40 MG TOTAL) BY MOUTH DAILY. 08/26/23   Sheliah Hatch, MD  rosuvastatin (CRESTOR) 20 MG tablet Take 1 tablet (20 mg total) by mouth daily. 12/23/22 09/08/23  O'NealRonnald Ramp, MD      Allergies    Penicillins, Sulfa antibiotics, Codeine, and Tramadol    Review of Systems   Review of Systems  Physical Exam Updated Vital Signs BP (!) 147/78 (BP Location: Right Arm)   Pulse (!) 104   Temp 99.1 F (37.3 C)   Resp 16   Ht 5\' 9"  (1.753 m)   Wt 114 kg   SpO2 95%   BMI 37.11 kg/m   Physical Exam Vitals and nursing note reviewed.  Constitutional:      General: He is not in acute distress.    Appearance: He is well-developed.  HENT:     Head: Normocephalic and atraumatic.     Nose: Nose normal.     Mouth/Throat:     Mouth: Mucous membranes are moist.  Eyes:     General:        Right eye: No discharge.        Left eye: No discharge.     Conjunctiva/sclera: Conjunctivae normal.  Cardiovascular:     Rate and Rhythm: Regular rhythm. Tachycardia present.     Heart sounds: Normal heart sounds.  Pulmonary:     Effort: Pulmonary effort is normal.     Breath sounds: Normal breath sounds.  Abdominal:     Palpations: Abdomen is soft.     Tenderness: There is generalized abdominal tenderness and tenderness in the right lower quadrant, suprapubic area and left lower quadrant. There is no guarding or rebound.     Hernia: No hernia is present.     Comments: Generalized tenderness but seems to be worse lower quadrants bilaterally  Musculoskeletal:     Cervical back: Normal range of motion and neck supple.  Skin:    General: Skin is warm and dry.  Neurological:     Mental Status: He is alert.     ED Results / Procedures / Treatments   Labs (all labs ordered are listed, but only  abnormal results are displayed) Labs Reviewed  COMPREHENSIVE METABOLIC PANEL - Abnormal; Notable for the following components:      Result Value   Sodium 132 (*)    Potassium 3.1 (*)    Chloride 95 (*)    CO2 21 (*)    Glucose, Bld 148 (*)    Anion gap 16 (*)    All other components within normal limits  CBC - Abnormal; Notable for the following components:   WBC 11.7 (*)    RBC 5.90 (*)    All other components within normal limits  URINALYSIS, ROUTINE W REFLEX MICROSCOPIC - Abnormal; Notable for the following components:   Specific Gravity, Urine >1.046 (*)    Glucose, UA 100 (*)    Ketones, ur >80 (*)    Protein, ur 30 (*)    All other components within normal limits  RESP PANEL BY RT-PCR (RSV, FLU A&B, COVID)  RVPGX2  LIPASE, BLOOD  LACTIC ACID, PLASMA    EKG None  Radiology CT ABDOMEN PELVIS W CONTRAST Result Date: 09/19/2023 CLINICAL DATA:  Abdominal pain.  Fever. EXAM: CT ABDOMEN AND PELVIS WITH CONTRAST TECHNIQUE: Multidetector CT imaging of the abdomen and pelvis was performed using the standard protocol following bolus administration of intravenous contrast. RADIATION DOSE REDUCTION: This exam was performed according to the departmental dose-optimization program which includes automated exposure control, adjustment of the mA and/or kV according to patient size and/or use of iterative reconstruction technique. CONTRAST:  OMNIPAQUE IOHEXOL 300 MG/ML  SOLN COMPARISON:  CT abdomen pelvis dated 07/17/2021. FINDINGS: Lower chest: The visualized lung bases are clear. No intra-abdominal free air or free fluid. Hepatobiliary: Fatty liver. No biliary dilatation. The gallbladder is unremarkable. Pancreas: Unremarkable. No pancreatic ductal dilatation or surrounding inflammatory changes. Spleen: Normal in size without focal abnormality. Adrenals/Urinary Tract: The adrenal glands are unremarkable. There is no hydronephrosis on either side. There is symmetric enhancement and excretion  of contrast by both kidneys. The visualized ureters and urinary bladder appear unremarkable. Stomach/Bowel: There is sigmoid diverticulosis. There is no bowel obstruction or active inflammation. The appendix is normal. Vascular/Lymphatic: Mild atherosclerotic calcification of the abdominal aorta. The IVC is unremarkable. No portal venous gas. There is no adenopathy. Reproductive: The prostate and seminal vesicles are grossly unremarkable. No pelvic mass. Other: None Musculoskeletal: Lower lumbar fusion. Total left hip arthroplasty. No acute osseous pathology. IMPRESSION: 1. No acute intra-abdominal or pelvic pathology. 2. Sigmoid diverticulosis. No bowel obstruction. Normal appendix. 3. Fatty liver. Electronically Signed   By: Elgie Collard M.D.   On:  09/19/2023 11:12    Procedures Procedures    Medications Ordered in ED Medications  sodium chloride 0.9 % bolus 1,000 mL (0 mLs Intravenous Stopped 09/19/23 1100)  morphine (PF) 4 MG/ML injection 4 mg (4 mg Intravenous Given 09/19/23 0952)  ondansetron (ZOFRAN) injection 4 mg (4 mg Intravenous Given 09/19/23 0951)  iohexol (OMNIPAQUE) 300 MG/ML solution 100 mL (100 mLs Intravenous Contrast Given 09/19/23 1009)  potassium chloride SA (KLOR-CON M) CR tablet 40 mEq (40 mEq Oral Given 09/19/23 1153)  sodium chloride 0.9 % bolus 1,000 mL (0 mLs Intravenous Stopped 09/19/23 1420)    ED Course/ Medical Decision Making/ A&P    Patient seen and examined. History obtained directly from patient and wife.  Labs/EKG: Ordered CBC, CMP, lipase, UA, lactate, viral panel.  Imaging: Ordered CT abdomen and pelvis.  Medications/Fluids: Ordered: Morphine/Zofran, fluid bolus.   Most recent vital signs reviewed and are as follows: BP (!) 147/78 (BP Location: Right Arm)   Pulse (!) 104   Temp 99.1 F (37.3 C)   Resp 16   Ht 5\' 9"  (1.753 m)   Wt 114 kg   SpO2 95%   BMI 37.11 kg/m   Initial impression: Febrile illness with abdominal pain  11:43 AM  Reassessment performed. Patient appears comfortable now.  Labs personally reviewed and interpreted including: CBC with mildly elevated white blood cell count at 11.7; CMP shows low sodium and chloride likely element of dehydration, low potassium 3.1, glucose 148 with minimally elevated anion gap at 16; lipase normal at 12; viral panel negative; lactate normal at 0.9.  Imaging personally visualized and interpreted including: CT abdomen pelvis, agree negative for acute findings.  Reviewed pertinent lab work and imaging with patient at bedside. Questions answered.   Most current vital signs reviewed and are as follows: BP 132/63   Pulse 86   Temp 99.1 F (37.3 C)   Resp 15   Ht 5\' 9"  (1.753 m)   Wt 114 kg   SpO2 95%   BMI 37.11 kg/m   Plan: Awaiting urine testing.  Will p.o. challenge, give oral potassium supplementation.  2:36 PM Reassessment performed. Patient appears stable, comfortable.  Fever has not come back.  No further bowel movements.  Labs personally reviewed and interpreted including: Urine concentrated, suggestive of dehydration  Reviewed pertinent lab work and imaging with patient at bedside. Questions answered.   Most current vital signs reviewed and are as follows: BP 134/65   Pulse 81   Temp 98.4 F (36.9 C) (Oral)   Resp 15   Ht 5\' 9"  (1.753 m)   Wt 114 kg   SpO2 97%   BMI 37.11 kg/m   Plan: Discharge to home.   Prescriptions written for: Bentyl  Other home care instructions discussed: Hydration, OTC meds, Bentyl, bland diet  ED return instructions discussed: The patient was urged to return to the Emergency Department immediately with worsening of current symptoms, worsening abdominal pain, persistent vomiting, blood noted in stools, fever, or any other concerns. The patient verbalized understanding.  Follow-up instructions discussed: Patient encouraged to follow-up with their PCP in 3 days.     Click here for ABCD2, HEART and other  calculatorsREFRESH Note before signing :1}                              Medical Decision Making Amount and/or Complexity of Data Reviewed Labs: ordered. Radiology: ordered.  Risk Prescription drug management.   For  this patient's complaint of abdominal pain, the following conditions were considered on the differential diagnosis: gastritis/PUD, enteritis/duodenitis, appendicitis, cholelithiasis/cholecystitis, cholangitis, pancreatitis, ruptured viscus, colitis, diverticulitis, small/large bowel obstruction, proctitis, cystitis, pyelonephritis, ureteral colic, aortic dissection, aortic aneurysm. Atypical chest etiologies were also considered including ACS, PE, and pneumonia.  Patient with mild electrolyte derangement, treated with IV fluids and oral repletion.  It is reassuring that fever has resolved and no persistent bowel movements.  Comfortable with discharge at this time.  Considered DKA but overall blood sugar not significantly elevated, slightly low bicarb at 21, anion gap minimally elevated at 16.  Overall symptoms not consistent with DKA.  The patient's vital signs, pertinent lab work and imaging were reviewed and interpreted as discussed in the ED course. Hospitalization was considered for further testing, treatments, or serial exams/observation. However as patient is well-appearing, has a stable exam, and reassuring studies today, I do not feel that they warrant admission at this time. This plan was discussed with the patient who verbalizes agreement and comfort with this plan and seems reliable and able to return to the Emergency Department with worsening or changing symptoms.     Final Clinical Impression(s) / ED Diagnoses Final diagnoses:  Febrile illness  Generalized abdominal pain    Rx / DC Orders ED Discharge Orders          Ordered    dicyclomine (BENTYL) 20 MG tablet  2 times daily        09/19/23 1436              Renne Crigler, PA-C 09/19/23 1440     Virgina Norfolk, DO 09/19/23 1440

## 2023-09-19 NOTE — Telephone Encounter (Signed)
 Copied from CRM [id number]. Topic: Clinical - Red Word Triage >> Sep 19, 2023  7:45 AM Kathryne Eriksson wrote: Red Word that prompted transfer to Nurse Triage: Fever 103 >> Sep 19, 2023  7:47 AM Kathryne Eriksson wrote: Patient has been having a fever, diarrhea and stomach pains since Saturday.   Chief Complaint: fever, abd pain, diarrhea, dry mouth, urination since 4 am has been x2, dark colored urine Symptoms: see above Frequency: constant Pertinent Negatives: Patient denies cp, sob Disposition: [x] ED /[] Urgent Care (no appt availability in office) / [] Appointment(In office/virtual)/ []  [facility]/ [] Home Care/ [] Refused Recommended Disposition /[]  Mobile Bus/ []  Follow-up with PCP Additional Notes: Instructed to go to the ER; Care advice given, denies questions; pcp office updated.   Reason for Disposition  [1] Drinking very little AND [2] dehydration suspected (e.g., no urine > 12 hours, very dry mouth, very lightheaded)  Answer Assessment - Initial Assessment Questions 1. TEMPERATURE: "What is the most recent temperature?"  "How was it measured?"      102 2. ONSET: "When did the fever start?"      Since Saturday am 3. CHILLS: "Do you have chills?" If yes: "How bad are they?"  (e.g., none, mild, moderate, severe)   - NONE: no chills   - MILD: feeling cold   - MODERATE: feeling very cold, some shivering (feels better under a thick blanket)   - SEVERE: feeling extremely cold with shaking chills (general body shaking, rigors; even under a thick blanket)      Yes, moderate 4. OTHER SYMPTOMS: "Do you have any other symptoms besides the fever?"  (e.g., abdomen pain, cough, diarrhea, earache, headache, sore throat, urination pain)     Stomach pain and diarrhea, headache 5. CAUSE: If there are no symptoms, ask: "What do you think is causing the fever?"      denies 6. CONTACTS: "Does anyone else in the family have an infection?"     denies 7. TREATMENT: "What have you done so  far to treat this fever?" (e.g., medications)     Advil and tylenol 8. IMMUNOCOMPROMISE: "Do you have of the following: diabetes, HIV positive, splenectomy, cancer chemotherapy, chronic steroid treatment, transplant patient, etc."     denies 9. PREGNANCY: "Is there any chance you are pregnant?" "When was your last menstrual period?"     na 10. TRAVEL: "Have you traveled out of the country in the last month?" (e.g., travel history, exposures)       Denies  Protocols used: [facility]

## 2023-09-19 NOTE — ED Triage Notes (Signed)
 C/o lower abd pain and n/v/d x 3 days. States "twisting feeling in abdomen"

## 2023-09-21 ENCOUNTER — Inpatient Hospital Stay (HOSPITAL_COMMUNITY): Admission: RE | Admit: 2023-09-21 | Payer: PPO | Source: Ambulatory Visit | Admitting: Orthopedic Surgery

## 2023-09-21 ENCOUNTER — Encounter (HOSPITAL_COMMUNITY): Admission: RE | Payer: Self-pay | Source: Ambulatory Visit

## 2023-09-21 LAB — TYPE AND SCREEN
ABO/RH(D): O POS
Antibody Screen: NEGATIVE

## 2023-09-21 SURGERY — TOTAL KNEE REVISION
Anesthesia: Choice | Site: Knee | Laterality: Right

## 2023-09-22 ENCOUNTER — Encounter: Payer: Self-pay | Admitting: Family Medicine

## 2023-09-22 ENCOUNTER — Telehealth: Payer: Self-pay | Admitting: Family Medicine

## 2023-09-22 ENCOUNTER — Ambulatory Visit (INDEPENDENT_AMBULATORY_CARE_PROVIDER_SITE_OTHER): Admitting: Family Medicine

## 2023-09-22 VITALS — BP 114/70 | HR 96 | Temp 98.4°F | Wt 254.2 lb

## 2023-09-22 DIAGNOSIS — R197 Diarrhea, unspecified: Secondary | ICD-10-CM

## 2023-09-22 DIAGNOSIS — R509 Fever, unspecified: Secondary | ICD-10-CM

## 2023-09-22 DIAGNOSIS — E860 Dehydration: Secondary | ICD-10-CM

## 2023-09-22 LAB — HEPATIC FUNCTION PANEL
ALT: 50 U/L (ref 0–53)
AST: 40 U/L — ABNORMAL HIGH (ref 0–37)
Albumin: 4.1 g/dL (ref 3.5–5.2)
Alkaline Phosphatase: 65 U/L (ref 39–117)
Bilirubin, Direct: 0.1 mg/dL (ref 0.0–0.3)
Total Bilirubin: 0.4 mg/dL (ref 0.2–1.2)
Total Protein: 7.5 g/dL (ref 6.0–8.3)

## 2023-09-22 LAB — CBC WITH DIFFERENTIAL/PLATELET
Basophils Absolute: 0 10*3/uL (ref 0.0–0.1)
Basophils Relative: 0.5 % (ref 0.0–3.0)
Eosinophils Absolute: 0.1 10*3/uL (ref 0.0–0.7)
Eosinophils Relative: 1.1 % (ref 0.0–5.0)
HCT: 47.2 % (ref 39.0–52.0)
Hemoglobin: 16 g/dL (ref 13.0–17.0)
Lymphocytes Relative: 25.2 % (ref 12.0–46.0)
Lymphs Abs: 2.2 10*3/uL (ref 0.7–4.0)
MCHC: 33.8 g/dL (ref 30.0–36.0)
MCV: 82.8 fl (ref 78.0–100.0)
Monocytes Absolute: 1.1 10*3/uL — ABNORMAL HIGH (ref 0.1–1.0)
Monocytes Relative: 12.2 % — ABNORMAL HIGH (ref 3.0–12.0)
Neutro Abs: 5.4 10*3/uL (ref 1.4–7.7)
Neutrophils Relative %: 61 % (ref 43.0–77.0)
Platelets: 201 10*3/uL (ref 150.0–400.0)
RBC: 5.71 Mil/uL (ref 4.22–5.81)
RDW: 14.4 % (ref 11.5–15.5)
WBC: 8.8 10*3/uL (ref 4.0–10.5)

## 2023-09-22 LAB — BASIC METABOLIC PANEL
BUN: 18 mg/dL (ref 6–23)
CO2: 29 meq/L (ref 19–32)
Calcium: 9.8 mg/dL (ref 8.4–10.5)
Chloride: 96 meq/L (ref 96–112)
Creatinine, Ser: 0.74 mg/dL (ref 0.40–1.50)
GFR: 94.01 mL/min (ref >60.00)
Glucose, Bld: 131 mg/dL — ABNORMAL HIGH (ref 70–99)
Potassium: 3.5 meq/L (ref 3.5–5.1)
Sodium: 135 meq/L (ref 135–145)

## 2023-09-22 LAB — LIPASE: Lipase: 38 U/L (ref 11.0–59.0)

## 2023-09-22 MED ORDER — ONDANSETRON HCL 4 MG PO TABS: 4.0000 mg | ORAL_TABLET | Freq: Three times a day (TID) | ORAL | 0 refills | Status: DC | PRN

## 2023-09-22 NOTE — Telephone Encounter (Signed)
 Type of form received: Surgical Clearance for surgery on 12/07/23  Additional comments:   Received by: [employer] - Front Desk  Form should be Faxed/mailed to: (address/ fax #) Fax to [phone]  Is patient requesting call for pickup: N/A  Form placed:  Labeled & placed in provider bin  Attach charge sheet.  Provider will determine charge.  Individual made aware of 3-5 business day turn around? N/A

## 2023-09-22 NOTE — Patient Instructions (Signed)
 Follow up as needed or as scheduled We'll notify you of your lab results and make any changes if needed Collect and return the stool sample as directed Drink LOTS of fluids!!  This is probably the most important thing! REST! Use the Ondansetron as needed for nausea/vomiting/diarrhea Continue Tylenol or Ibuprofen for fever Call with any questions or concerns Hang in there!!

## 2023-09-22 NOTE — Progress Notes (Signed)
   Subjective:    Patient ID: Jordan Mcconnell, male    DOB: 04-05-1956, 68 y.o.   MRN: [id number]  HPI ER f/u- pt went to ER on 3/17 w/ fever, abd pain, and diarrhea.  Labs showed low K+ (3.1), elevated WBC (11.7), and concentrated urine.  CT was done- no acute findings.  Was tx'd w/ IVF, K+, morphine, Zofran.  D/C'd w/ Bentyl.  Today pt and wife report that fever is gone but he continues to feel weak, clammy, dizzy.  Tm 103 on Sunday.  He is not drinking a lot of fluids.  No nausea.  Continues to have diarrhea- described as oily.  No recent abx.  Denies abd pain unless cramping w/ stools   Review of Systems For ROS see HPI     Objective:   Physical Exam Vitals reviewed.  Constitutional:      General: He is not in acute distress.    Appearance: Normal appearance. He is obese. He is not ill-appearing.  HENT:     Head: Normocephalic and atraumatic.  Cardiovascular:     Rate and Rhythm: Normal rate and regular rhythm.  Pulmonary:     Effort: Pulmonary effort is normal. No respiratory distress.     Breath sounds: No wheezing or rhonchi.  Abdominal:     General: There is no distension.     Palpations: Abdomen is soft.     Tenderness: There is no abdominal tenderness. There is no guarding or rebound.  Skin:    Coloration: Skin is pale.  Neurological:     General: No focal deficit present.     Mental Status: He is alert and oriented to person, place, and time.  Psychiatric:        Mood and Affect: Mood normal.        Behavior: Behavior normal.        Thought Content: Thought content normal.           Assessment & Plan:   Fever/Diarrhea- new to provider, ongoing for pt.  Thankfully fever has resolved but he continues to have diarrhea.  Not as frequently as when he went to the ER, but still present.  He feels weak.  Admits to poor fluid intake.  Discussed dehydration and need to increase fluids.  Will check labs to r/o pancreatic or biliary issue.  Will do stool studies to r/o  C Diff or other infxn.  Zofran prn.  Reviewed supportive care and red flags that should prompt return.  Pt expressed understanding and is in agreement w/ plan.

## 2023-09-22 NOTE — Telephone Encounter (Signed)
 Placed in your sign folder for completion, will fax back when this is done.

## 2023-09-23 ENCOUNTER — Telehealth: Payer: Self-pay

## 2023-09-23 ENCOUNTER — Encounter: Payer: Self-pay | Admitting: Family Medicine

## 2023-09-23 NOTE — Telephone Encounter (Signed)
 Pt doesn't have a sleep specialist, okay to place referral for new study so patient can get renewal on supplies.

## 2023-09-23 NOTE — Telephone Encounter (Signed)
 Called and informed patient he will call if new referral needed

## 2023-09-23 NOTE — Telephone Encounter (Signed)
 He will need to reach out to the Dr that did his sleep study in order to get a new machine

## 2023-09-23 NOTE — Telephone Encounter (Signed)
 Copied from CRM [id number]. Topic: Clinical - Prescription Issue >> Sep 23, 2023 12:27 PM Sim Boast F wrote: Reason for CRM: Patient requested that a new order for cpap machine be faxed to [phone]

## 2023-09-23 NOTE — Telephone Encounter (Signed)
 It looks like his last sleep study was 2010.  He already sees Pulmonary (who often do sleep studies) so he can either call them to ask about the equipment or we can officially refer him back to Pulmonary for his OSA and CPAP supplies/upgrade/etc (refer pulm, dx OSA)

## 2023-09-23 NOTE — Telephone Encounter (Signed)
 Patient requesting CPAP order? I do not see where you discussed this yesterday please advise

## 2023-09-24 LAB — CLOSTRIDIUM DIFFICILE BY PCR: Toxigenic C. Difficile by PCR: NEGATIVE

## 2023-09-26 ENCOUNTER — Telehealth: Payer: Self-pay | Admitting: Pharmacy Technician

## 2023-09-26 ENCOUNTER — Telehealth (HOSPITAL_BASED_OUTPATIENT_CLINIC_OR_DEPARTMENT_OTHER): Payer: Self-pay | Admitting: Pulmonary Disease

## 2023-09-26 ENCOUNTER — Other Ambulatory Visit (HOSPITAL_COMMUNITY): Payer: Self-pay

## 2023-09-26 DIAGNOSIS — G4733 Obstructive sleep apnea (adult) (pediatric): Secondary | ICD-10-CM

## 2023-09-26 NOTE — Telephone Encounter (Signed)
 Pharmacy Patient Advocate Encounter   Received notification from CoverMyMeds that prior authorization for ONDANSETRON 4MG  TABLETS is required/requested.   Insurance verification completed.   The patient is insured through [facility] ADVANTAGE/RX ADVANCE .   Per test claim: PA required; PA submitted to above mentioned insurance via Phone Key/confirmation #/EOC [id number] Status is pending

## 2023-09-26 NOTE — Telephone Encounter (Signed)
 Order for mask fitting sent to [facility]

## 2023-09-26 NOTE — Telephone Encounter (Signed)
 Patient is needing a new script for his CPAP supplies states not sealing well and needs a full face mask. Will patient need an office visit prior to 11/01/23? LOV is 04/2023 in his chart. Please advise and call patient/ sent mychart  [phone]. [phone]

## 2023-09-26 NOTE — Telephone Encounter (Signed)
 [employer]- is the company patient uses

## 2023-09-26 NOTE — Telephone Encounter (Signed)
 Patient gave me a fax for supplies which is [phone]

## 2023-09-27 ENCOUNTER — Other Ambulatory Visit (HOSPITAL_COMMUNITY): Payer: Self-pay

## 2023-09-28 NOTE — Telephone Encounter (Signed)
 Pharmacy Patient Advocate Encounter  Received notification from [facility] ADVANTAGE/RX ADVANCE that Prior Authorization for ONDANSETRON 4MG  TABLETS  has been DENIED.  Full denial letter will be uploaded to the media tab. See denial reason below.

## 2023-09-28 NOTE — Telephone Encounter (Signed)
**Note De-identified  [place] Obfuscation** Please advise 

## 2023-09-29 LAB — SALMONELLA/SHIGELLA CULT, CAMPY EIA AND SHIGA TOXIN RFL ECOLI: SS RESULT:: NOT DETECTED

## 2023-09-29 LAB — TIQ-NTM

## 2023-09-29 NOTE — Telephone Encounter (Signed)
 Saw pt out and about this weekend and he reported feeling well.  No need for medication at this time

## 2023-10-03 NOTE — Telephone Encounter (Signed)
 Pt has been advised.

## 2023-10-03 NOTE — Telephone Encounter (Signed)
**Note De-identified  [place] Obfuscation** Please advise 

## 2023-10-03 NOTE — Telephone Encounter (Signed)
 We can combine the CPE and surgical clearance appts and do them all at the same time

## 2023-10-03 NOTE — Telephone Encounter (Signed)
Appt scheduled 5/14.

## 2023-10-03 NOTE — Telephone Encounter (Signed)
 Pt is asking for surgical clearance. Pt is asking if 5/14 CPE appt we can fill out this paperwork at this time.

## 2023-10-07 ENCOUNTER — Other Ambulatory Visit: Payer: Self-pay | Admitting: Family Medicine

## 2023-10-10 ENCOUNTER — Other Ambulatory Visit: Payer: Self-pay

## 2023-10-10 ENCOUNTER — Telehealth: Payer: Self-pay | Admitting: Family Medicine

## 2023-10-10 DIAGNOSIS — K219 Gastro-esophageal reflux disease without esophagitis: Secondary | ICD-10-CM

## 2023-10-10 MED ORDER — OMEPRAZOLE 40 MG PO CPDR: 40.0000 mg | DELAYED_RELEASE_CAPSULE | Freq: Every day | ORAL | 0 refills | Status: DC

## 2023-10-10 NOTE — Addendum Note (Signed)
 Addended by: Eldred Manges on: 10/10/2023 02:33 PM   Modules accepted: Orders

## 2023-10-10 NOTE — Telephone Encounter (Signed)
 Called pharmacy and they just needed a refill to be sent so that has been done

## 2023-10-10 NOTE — Telephone Encounter (Signed)
 Encourage patient to contact the pharmacy for refills or they can request refills through [facility]  (Please schedule appointment if patient has not been seen in over a year)    WHAT PHARMACY WOULD THEY LIKE THIS SENT TO: [id number] - [place], [place] - [address]   MEDICATION NAME & DOSE: omeprazole (PRILOSEC) 40 MG capsule   NOTES/COMMENTS FROM PATIENT: Patient states that his insurance denied the coverage for this medication. I only see a denial letter for Ondansetron 4mg .      Front office please notify patient: It takes 48-72 hours to process rx refill requests Ask patient to call pharmacy to ensure rx is ready before heading there.

## 2023-10-23 NOTE — Progress Notes (Unsigned)
 Cardiology Office Note:  .   Date:  10/25/2023  ID:  Jordan Mcconnell, DOB 01/11/1956, MRN [id number] PCP: Jess Morita, MD  [facility] Providers Cardiologist:  Gaylyn Keas, MD { History of Present Illness: .    Chief Complaint  Patient presents with   Follow-up         Jordan Mcconnell is a 68 y.o. male with history of CAD, DM, HTN, HLD who presents for follow-up.   History of Present Illness   Jordan Mcconnell "Jordan Mcconnell" is a 68 year old male with diabetes, minimal CAD, HTN, HLD, obesity who presents for follow-up.  His A1c was elevated, resulting in the cancellation of a planned knee replacement surgery last month. He is actively working on improving his diet and increasing exercise to better control his blood sugar levels. He has been taking Jardiance  for approximately six months.  He experiences symptoms of neuropathy, including numbness in his hands and sharp, needle-like pains in his foot occurring intermittently. He is concerned about these symptoms and their potential link to diabetes.  Regarding cardiovascular health, he has no chest pains or trouble breathing. A coronary CTA and echocardiogram last year showed minimal disease and a normal echo. His coronary calcium  score was 171 (62nd percentile), with less than 25% stenosis in the major epicardial vessels. His LDL is well controlled at 24 mg/dL on Crestor  20 mg daily, and his blood pressure is well managed on HCTZ 25 mg daily.  He mentions a small lung nodule found on a CT scan, attributed to long COVID, but he has never been a smoker. He experiences multiple sneezes in succession and difficulty catching his breath since having COVID.          Problem List DM -A1c 8.8 Obesity (BMI 37) HTN OSA Non-obstructive CAD -LHC 2020 25% LAD -minimal CAD <25% 6. HLD -T chol 85, HDL 30, LDL 24, TG 151    ROS: All other ROS reviewed and negative. Pertinent positives noted in the HPI.     Studies Reviewed: Aaron Aas        CCTA 12/26/2022 IMPRESSION: 1. Minimal mixed non-obstructive CAD, CADRADS = 1.   2. Coronary artery calcium  score is 171, which places the patient in the 62nd percentile for age/race and sex-matched control.   3. Normal coronary origin with right dominance. 4. Cardiovascular risk factor modification is recommended.  TTE 12/27/2022  1. Left ventricular ejection fraction, by estimation, is 60 to 65%. The  left ventricle has normal function. The left ventricle has no regional  wall motion abnormalities. There is mild left ventricular hypertrophy.  Left ventricular diastolic parameters  were normal.   2. Right ventricular systolic function is normal. The right ventricular  size is mildly enlarged. Tricuspid regurgitation signal is inadequate for  assessing PA pressure.   3. The mitral valve is normal in structure. No evidence of mitral valve  regurgitation. No evidence of mitral stenosis.   4. The aortic valve is tricuspid. Aortic valve regurgitation is not  visualized. No aortic stenosis is present.   Physical Exam:   VS:  BP 128/72   Pulse 67   Ht 5\' 9"  (1.753 m)   Wt 255 lb 9.6 oz (115.9 kg)   SpO2 97%   BMI 37.75 kg/m    Wt Readings from Last 3 Encounters:  10/25/23 255 lb 9.6 oz (115.9 kg)  09/22/23 254 lb 4 oz (115.3 kg)  09/19/23 251 lb 5.2 oz (114 kg)    GEN:  Well nourished, well developed in no acute distress NECK: No JVD; No carotid bruits CARDIAC: RRR, no murmurs, rubs, gallops RESPIRATORY:  Clear to auscultation without rales, wheezing or rhonchi  ABDOMEN: Soft, non-tender, non-distended EXTREMITIES:  No edema; No deformity  ASSESSMENT AND PLAN: .   Assessment and Plan    Nonobstructive coronary artery disease Nonobstructive CAD with <25% stenosis in major epicardial vessels. Coronary calcium  score 171, 62nd percentile. Echocardiogram normal. No angina or dyspnea. Focus on prevention to avoid progression. Cleared for surgery from cardiac standpoint. - no  need for ASA - continue crestor  20 mg daily   Essential (primary) hypertension Hypertension well-controlled on current medication regimen. Active lifestyle maintained - Continue HCTZ 25 mg daily.  Hyperlipidemia Cholesterol levels well-controlled with LDL at 24 on Crestor  20 mg daily. - Refill Crestor  20 mg daily. - Goal LDL <70 mg/dl  Type 2 diabetes mellitus with neuropathy Diabetes management suboptimal with A1c at 8.8. Neuropathy symptoms include numbness in hands and sharp needle pains in feet. Current treatment includes Jardiance . Discussed newer medications like Ozempic and Mounjaro, but cost is a concern. Emphasized diet and exercise to improve glycemic control and reduce neuropathy symptoms. Surgery postponed due to high A1c, increasing infection risk.  General Health Maintenance Routine screenings and preventive measures up to date. No follow-up needed for lung nodule due to non-smoking status.  Follow-Up Coordination with Dr. Paulla Bossier for ongoing management. - Follow up as needed.              Follow-up: Return if symptoms worsen or fail to improve.   Signed, Gigi Kyle. Rolm Clos, MD, [facility]  [facility]  [address], [address] [place], [place] [address] [phone]  10:17 AM

## 2023-10-25 ENCOUNTER — Ambulatory Visit (HOSPITAL_BASED_OUTPATIENT_CLINIC_OR_DEPARTMENT_OTHER): Admitting: Cardiovascular Disease

## 2023-10-25 ENCOUNTER — Encounter (HOSPITAL_BASED_OUTPATIENT_CLINIC_OR_DEPARTMENT_OTHER): Payer: Self-pay | Admitting: Cardiovascular Disease

## 2023-10-25 ENCOUNTER — Other Ambulatory Visit (HOSPITAL_BASED_OUTPATIENT_CLINIC_OR_DEPARTMENT_OTHER): Payer: Self-pay

## 2023-10-25 VITALS — BP 128/72 | HR 67 | Ht 69.0 in | Wt 255.6 lb

## 2023-10-25 DIAGNOSIS — E782 Mixed hyperlipidemia: Secondary | ICD-10-CM | POA: Diagnosis not present

## 2023-10-25 DIAGNOSIS — I2510 Atherosclerotic heart disease of native coronary artery without angina pectoris: Secondary | ICD-10-CM | POA: Diagnosis not present

## 2023-10-25 DIAGNOSIS — R0602 Shortness of breath: Secondary | ICD-10-CM

## 2023-10-25 MED ORDER — ROSUVASTATIN CALCIUM 20 MG PO TABS: 20.0000 mg | ORAL_TABLET | Freq: Every day | ORAL | 3 refills | Status: AC

## 2023-10-25 NOTE — Patient Instructions (Signed)
 Medication Instructions:  Your physician recommends that you continue on your current medications as directed. Please refer to the Current Medication list given to you today.  *If you need a refill on your cardiac medications before your next appointment, please call your pharmacy*  Follow-Up: At [facility], you and your health needs are our priority.  As part of our continuing mission to provide you with exceptional heart care, we have created designated Provider Care Teams.  These Care Teams include your primary Cardiologist (physician) and Advanced Practice Providers (APPs -  Physician Assistants and Nurse Practitioners) who all work together to provide you with the care you need, when you need it.  We recommend signing up for the patient portal called "MyChart".  Sign up information is provided on this After Visit Summary.  MyChart is used to connect with patients for Virtual Visits (Telemedicine).  Patients are able to view lab/test results, encounter notes, upcoming appointments, etc.  Non-urgent messages can be sent to your provider as well.   To learn more about what you can do with MyChart, go to [email or web address].    Your next appointment:   As needed  Provider:   Dr. Rolm Clos

## 2023-10-27 DIAGNOSIS — L814 Other melanin hyperpigmentation: Secondary | ICD-10-CM | POA: Diagnosis not present

## 2023-10-27 DIAGNOSIS — L570 Actinic keratosis: Secondary | ICD-10-CM | POA: Diagnosis not present

## 2023-10-27 DIAGNOSIS — Z08 Encounter for follow-up examination after completed treatment for malignant neoplasm: Secondary | ICD-10-CM | POA: Diagnosis not present

## 2023-10-27 DIAGNOSIS — Z85828 Personal history of other malignant neoplasm of skin: Secondary | ICD-10-CM | POA: Diagnosis not present

## 2023-10-27 DIAGNOSIS — L578 Other skin changes due to chronic exposure to nonionizing radiation: Secondary | ICD-10-CM | POA: Diagnosis not present

## 2023-10-27 DIAGNOSIS — D225 Melanocytic nevi of trunk: Secondary | ICD-10-CM | POA: Diagnosis not present

## 2023-11-01 ENCOUNTER — Ambulatory Visit (HOSPITAL_BASED_OUTPATIENT_CLINIC_OR_DEPARTMENT_OTHER): Admitting: Pulmonary Disease

## 2023-11-01 ENCOUNTER — Encounter (HOSPITAL_BASED_OUTPATIENT_CLINIC_OR_DEPARTMENT_OTHER)

## 2023-11-07 ENCOUNTER — Ambulatory Visit: Admitting: Pulmonary Disease

## 2023-11-07 DIAGNOSIS — R0602 Shortness of breath: Secondary | ICD-10-CM | POA: Diagnosis not present

## 2023-11-07 LAB — PULMONARY FUNCTION TEST
DL/VA % pred: 112 %
DL/VA: 4.65 ml/min/mmHg/L
DLCO cor % pred: 102 %
DLCO cor: 26.15 ml/min/mmHg
DLCO unc % pred: 102 %
DLCO unc: 26.15 ml/min/mmHg
FEF 25-75 Post: 3.84 L/s
FEF 25-75 Pre: 3.44 L/s
FEF2575-%Change-Post: 11 %
FEF2575-%Pred-Post: 151 %
FEF2575-%Pred-Pre: 136 %
FEV1-%Change-Post: 1 %
FEV1-%Pred-Post: 104 %
FEV1-%Pred-Pre: 102 %
FEV1-Post: 3.37 L
FEV1-Pre: 3.32 L
FEV1FVC-%Change-Post: 2 %
FEV1FVC-%Pred-Pre: 110 %
FEV6-%Change-Post: -1 %
FEV6-%Pred-Post: 96 %
FEV6-%Pred-Pre: 98 %
FEV6-Post: 4.01 L
FEV6-Pre: 4.05 L
FEV6FVC-%Change-Post: 0 %
FEV6FVC-%Pred-Post: 105 %
FEV6FVC-%Pred-Pre: 105 %
FVC-%Change-Post: -1 %
FVC-%Pred-Post: 91 %
FVC-%Pred-Pre: 92 %
FVC-Post: 4.01 L
FVC-Pre: 4.05 L
Post FEV1/FVC ratio: 84 %
Post FEV6/FVC ratio: 100 %
Pre FEV1/FVC ratio: 82 %
Pre FEV6/FVC Ratio: 100 %
RV % pred: 77 %
RV: 1.81 L
TLC % pred: 85 %
TLC: 5.85 L

## 2023-11-07 NOTE — Progress Notes (Signed)
 Full PFT performed today.

## 2023-11-07 NOTE — Patient Instructions (Signed)
 Full PFT performed today.

## 2023-11-15 DIAGNOSIS — Z96651 Presence of right artificial knee joint: Secondary | ICD-10-CM | POA: Insufficient documentation

## 2023-11-15 NOTE — H&P (Signed)
 KNEE ADMISSION H&P  Subjective:  Chief Complaint: Right knee pain.  HPI: Jordan Mcconnell, 68 y.o. male  who presents for a follow-up visit due to right knee pain. The patient reports that the pain occurs primarily when walking, especially on uneven surfaces, and extends into his hip. He mentions that the pain has been present for approximately two months and worsens with activity. At night, the knee becomes swollen, making it difficult for him to find a comfortable position to sleep. The patient has a history of knee replacement surgery nearly 20 years ago. He recently had a bone scan, which came back normal. He also had blood work done, including an A1C check, but has not received the results yet. The patient has been using a brace, which provides some relief, but the pain persists. He is considering a polyethylene revision to address the instability and pain.   Patient Active Problem List   Diagnosis Date Noted   Hyperlipidemia associated with type 2 diabetes mellitus (HCC) 01/27/2022   Insomnia 10/28/2021   Seasonal and perennial allergic rhinitis 06/18/2021   Multiple drug allergies 06/03/2021   Chronic rhinitis 06/03/2021   Erectile dysfunction 04/01/2021   OA (osteoarthritis) of hip 04/16/2019   Dysphagia 09/18/2018   Lung nodules 09/18/2018   Chest pain 07/11/2018   Dyspnea 06/21/2018   Physical exam 07/22/2017   Adjustment disorder 03/17/2017   Diabetes mellitus, type II (HCC) 07/29/2016   Chronic cough 07/07/2016   Irritable bowel syndrome with diarrhea 05/12/2016   Gastroesophageal reflux disease 05/12/2016   Back pain 08/06/2015   Obstructive sleep apnea    HTN (hypertension)    Morbid obesity (HCC)     Past Medical History:  Diagnosis Date   Angiodysplasia of cecum    Arthritis    Cancer (HCC)    skin cancer on hand and head   Complication of anesthesia    one surgery pt. states he was hard to wake up   Coronary artery disease    Diabetes mellitus without  complication (HCC)    type 2   GERD (gastroesophageal reflux disease)    HTN (hypertension)    Irritable bowel syndrome with diarrhea 05/12/2016   Obesity    OSA (obstructive sleep apnea)    uses CPAP   PONV (postoperative nausea and vomiting)     Past Surgical History:  Procedure Laterality Date   BACK SURGERY  08/2015   bilateral knee replacements      COLONOSCOPY     ESOPHAGEAL DILATION     1 or 2 times   ESOPHAGOGASTRODUODENOSCOPY N/A 10/30/2013   Procedure: ESOPHAGOGASTRODUODENOSCOPY (EGD);  Surgeon: Garrett Kallman, MD;  Location: [facility];  Service: Endoscopy;  Laterality: N/A;   JOINT REPLACEMENT     bilateral knees   LEFT HEART CATH AND CORONARY ANGIOGRAPHY N/A 07/13/2018   Procedure: LEFT HEART CATH AND CORONARY ANGIOGRAPHY;  Surgeon: Lucendia Rusk, MD;  Location: [facility] INVASIVE CV LAB;  Service: Cardiovascular;  Laterality: N/A;   left shoulder surgery      x 3   right shoulder surgery      x 3   SHOULDER ARTHROSCOPY     Bilateral 3 times each shoulder   TOTAL HIP ARTHROPLASTY Left 04/16/2019   Procedure: TOTAL HIP ARTHROPLASTY ANTERIOR APPROACH;  Surgeon: Liliane Rei, MD;  Location: [facility];  Service: Orthopedics;  Laterality: Left;     Prior to Admission medications   Medication Sig Start Date End Date Taking? Authorizing Provider  albuterol  (  VENTOLIN  HFA) 108 (90 Base) MCG/ACT inhaler Inhale 2 puffs into the lungs every 6 (six) hours as needed for wheezing or shortness of breath. 01/13/23   Quillian Brunt, MD  APPLE CIDER VINEGAR PO Take 1,200 mg by mouth 2 (two) times daily.    [provider]  Berberine Chloride (BERBERINE HCI PO) Take 1,200 mg by mouth 2 (two) times daily.    [provider]  CHROMIUM PO Take 500 mg by mouth every evening.    [provider]  empagliflozin  (JARDIANCE ) 10 MG TABS tablet Take 1 tablet (10 mg total) by mouth daily before breakfast. 06/17/23   Tabori, Katherine E, MD   hydrochlorothiazide  (HYDRODIURIL ) 25 MG tablet TAKE 1 TABLET (25 MG TOTAL) BY MOUTH DAILY. 08/09/23   Tabori, Katherine E, MD  loratadine (CLARITIN) 10 MG tablet Take 10 mg by mouth every evening.    [provider]  metFORMIN  (GLUCOPHAGE ) 1000 MG tablet Take 1 tablet (1,000 mg total) by mouth 2 (two) times daily with a meal. 12/16/22   Jess Morita, MD  montelukast  (SINGULAIR ) 10 MG tablet Take 10 mg by mouth every evening.    [provider]  omeprazole  (PRILOSEC) 40 MG capsule Take 1 capsule (40 mg total) by mouth daily. 10/10/23   Jess Morita, MD  rosuvastatin  (CRESTOR ) 20 MG tablet Take 1 tablet (20 mg total) by mouth daily. 10/25/23 01/23/24  O'NealCathay Clonts, MD    Allergies  Allergen Reactions   Penicillins Rash         Sulfa Antibiotics Rash   Codeine Nausea Only   Tramadol Nausea Only    Severe dizziness    Social History   Socioeconomic History   Marital status: Married    Spouse name: Not on file   Number of children: 2   Years of education: Not on file   Highest education level: Associate degree: occupational, Scientist, product/process development, or vocational program  Occupational History   Occupation: retired   Occupation: Retired Water quality scientist  Tobacco Use   Smoking status: Never   Smokeless tobacco: Never  Vaping Use   Vaping status: Never Used  Substance and Sexual Activity   Alcohol use: No   Drug use: No   Sexual activity: Yes  Other Topics Concern   Not on file  Social History Narrative   Not on file   Social Drivers of Health   Financial Resource Strain: Low Risk  (07/28/2023)   Overall Financial Resource Strain (CARDIA)    Difficulty of Paying Living Expenses: Not hard at all  Food Insecurity: No Food Insecurity (07/28/2023)   Hunger Vital Sign    Worried About Running Out of Food in the Last Year: Never true    Ran Out of Food in the Last Year: Never true  Transportation Needs: No Transportation Needs (07/28/2023)   PRAPARE -  Administrator, Civil Service (Medical): No    Lack of Transportation (Non-Medical): No  Physical Activity: Insufficiently Active (07/28/2023)   Exercise Vital Sign    Days of Exercise per Week: 3 days    Minutes of Exercise per Session: 30 min  Stress: Stress Concern Present (07/28/2023)   [employer] of Occupational Health - Occupational Stress Questionnaire    Feeling of Stress : Rather much  Social Connections: Socially Integrated (07/28/2023)   Social Connection and Isolation Panel [NHANES]    Frequency of Communication with Friends and Family: More than three times a week    Frequency of  Social Gatherings with Friends and Family: More than three times a week    Attends Religious Services: More than 4 times per year    Active Member of [employer] or Organizations: Yes    Attends Engineer, structural: More than 4 times per year    Marital Status: Married  Catering manager Violence: Not At Risk (10/07/2022)   Humiliation, Afraid, Rape, and Kick questionnaire    Fear of Current or Ex-Partner: No    Emotionally Abused: No    Physically Abused: No    Sexually Abused: No    Tobacco Use: Low Risk  (10/25/2023)   Patient History    Smoking Tobacco Use: Never    Smokeless Tobacco Use: Never    Passive Exposure: Not on file   Social History   Substance and Sexual Activity  Alcohol Use No    Family History  Problem Relation Age of Onset   Memory loss Mother    Heart murmur Mother    Colon cancer Father    Stroke Father    Cancer Father        colon   Dementia Father    Asthma Maternal Grandmother    Memory loss Maternal Grandmother    Cancer Maternal Grandfather        lung and bone   Asthma Paternal Grandmother    Liver disease Neg Hx    Esophageal cancer Neg Hx     ROS  Objective:  Physical Exam: Well nourished and well developed.  General: Alert and oriented x3, cooperative and pleasant, no acute distress.  Head: normocephalic, atraumatic,  neck supple.  Eyes: EOMI. Abdomen: non-tender to palpation and soft, normoactive bowel sounds. Musculoskeletal: - Right knee shows no effusion. Range approximately 0-125 degrees. Slight varus valgus laxity in extension and less AP laxity in flexion. No gross instability. Walking with a slight limp. Calves soft and nontender. Motor function intact in LE. Strength 5/5 LE bilaterally. Neuro: Distal pulses 2+. Sensation to light touch intact in LE.  Vital signs in last 24 hours: BP: ()/()  Arterial Line BP: ()/()   Imaging Review - Radiographs from September 2024 demonstrate the prosthesis in excellent position with no periprosthetic abnormalities. This includes an AP view of both knees and a lateral view of the right knee.  - Bone scan reviewed from the [facility] system, showing no signs of loosening or infection.  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 The patient has a history of knee replacement surgery nearly 20 years ago. Recent radiographs and a bone scan demonstrate no signs of loosening or infection. The patient is experiencing effusion from the wear of the polyethylene, leading to instability and pain. There is no evidence of osteolysis or loosening of the components. The patient will benefit from a polyethylene revision to restore stability and reduce inflammation and pain. Risks and benefits reviewed with patient. The patient acknowledged the explanation, agreed to proceed with the plan and consent was signed. Patient is being admitted for inpatient treatment for surgery, pain control, PT, OT, prophylactic antibiotics, VTE prophylaxis, progressive ambulation and ADLs and discharge planning. The patient is planning to be discharged home.  Anticipated LOS equal to or greater than 2 midnights due to - Age 30 and older with one or more of the following:             - Obesity             -  Expected need for  hospital services (PT, OT, Nursing) required for safe   discharge             - Anticipated need for postoperative skilled nursing care or inpatient rehab             - Active co-morbidities: Diabetes and Coronary Artery Disease OR    - Unanticipated findings during/Post Surgery: Slow post-op progression: GI, pain control, mobility  - Patient is a high risk of re-admission due to: None  Therapy Plans: [facility] [place] Disposition: Home with Wife Planned DVT Prophylaxis: Aspirin  81 mg BID DME Needed: None PCP: Genetta Kenning, MD (requesting cardiac and pulm clearance) Cardiologist: Rolm Clos, MD (clearance received) Pulmonologist: Girard Lam, NP (appt 05/19) TXA: IV Allergies: codeine (nausea), tramadol (nausea), penicillin (rash) Anesthesia Concerns: N/V, difficulty awakening, uses CPAP BMI: 36.5  Last HgbA1c: 8.8% - will recheck with pre-op labs  Pharmacy: [facility] [place], [place])  Other: -Surgery postponed prior due to A1c - will recheck with pre-op labs. Patient aware this needs to be below 7.8% -Discussed dilaudid  post-op  - Patient was instructed on what medications to stop prior to surgery. - Follow-up visit in 2 weeks with Dr. France Ina - Begin physical therapy following surgery - Pre-operative lab work as pre-surgical testing - Prescriptions will be provided in hospital at time of discharge  R. Brinton Canavan, PA-C Orthopedic Surgery [facility] [place]

## 2023-11-16 ENCOUNTER — Ambulatory Visit (INDEPENDENT_AMBULATORY_CARE_PROVIDER_SITE_OTHER): Payer: PPO | Admitting: Family Medicine

## 2023-11-16 ENCOUNTER — Encounter: Payer: Self-pay | Admitting: Family Medicine

## 2023-11-16 VITALS — BP 110/70 | HR 78 | Temp 97.8°F | Ht 69.0 in | Wt 245.1 lb

## 2023-11-16 DIAGNOSIS — Z125 Encounter for screening for malignant neoplasm of prostate: Secondary | ICD-10-CM | POA: Diagnosis not present

## 2023-11-16 DIAGNOSIS — Z0000 Encounter for general adult medical examination without abnormal findings: Secondary | ICD-10-CM

## 2023-11-16 DIAGNOSIS — E119 Type 2 diabetes mellitus without complications: Secondary | ICD-10-CM

## 2023-11-16 DIAGNOSIS — Z7984 Long term (current) use of oral hypoglycemic drugs: Secondary | ICD-10-CM

## 2023-11-16 LAB — CBC WITH DIFFERENTIAL/PLATELET
Basophils Absolute: 0.1 10*3/uL (ref 0.0–0.1)
Basophils Relative: 0.7 % (ref 0.0–3.0)
Eosinophils Absolute: 0.5 10*3/uL (ref 0.0–0.7)
Eosinophils Relative: 6.4 % — ABNORMAL HIGH (ref 0.0–5.0)
HCT: 47.7 % (ref 39.0–52.0)
Hemoglobin: 15.6 g/dL (ref 13.0–17.0)
Lymphocytes Relative: 30 % (ref 12.0–46.0)
Lymphs Abs: 2.5 10*3/uL (ref 0.7–4.0)
MCHC: 32.6 g/dL (ref 30.0–36.0)
MCV: 85.4 fl (ref 78.0–100.0)
Monocytes Absolute: 0.6 10*3/uL (ref 0.1–1.0)
Monocytes Relative: 7.1 % (ref 3.0–12.0)
Neutro Abs: 4.6 10*3/uL (ref 1.4–7.7)
Neutrophils Relative %: 55.8 % (ref 43.0–77.0)
Platelets: 199 10*3/uL (ref 150.0–400.0)
RBC: 5.58 Mil/uL (ref 4.22–5.81)
RDW: 15 % (ref 11.5–15.5)
WBC: 8.3 10*3/uL (ref 4.0–10.5)

## 2023-11-16 LAB — PSA, MEDICARE: PSA: 0.86 ng/mL (ref 0.10–4.00)

## 2023-11-16 LAB — LIPID PANEL
Cholesterol: 89 mg/dL (ref 0–200)
HDL: 28.6 mg/dL — ABNORMAL LOW (ref >39.00)
LDL Cholesterol: 33 mg/dL (ref 0–99)
NonHDL: 60.5
Total CHOL/HDL Ratio: 3
Triglycerides: 140 mg/dL (ref 0.0–149.0)
VLDL: 28 mg/dL (ref 0.0–40.0)

## 2023-11-16 LAB — HEPATIC FUNCTION PANEL
ALT: 36 U/L (ref 0–53)
AST: 24 U/L (ref 0–37)
Albumin: 4.7 g/dL (ref 3.5–5.2)
Alkaline Phosphatase: 80 U/L (ref 39–117)
Bilirubin, Direct: 0.1 mg/dL (ref 0.0–0.3)
Total Bilirubin: 0.6 mg/dL (ref 0.2–1.2)
Total Protein: 7.2 g/dL (ref 6.0–8.3)

## 2023-11-16 LAB — BASIC METABOLIC PANEL WITH GFR
BUN: 21 mg/dL (ref 6–23)
CO2: 26 meq/L (ref 19–32)
Calcium: 9.5 mg/dL (ref 8.4–10.5)
Chloride: 99 meq/L (ref 96–112)
Creatinine, Ser: 0.66 mg/dL (ref 0.40–1.50)
GFR: 97.21 mL/min (ref >60.00)
Glucose, Bld: 113 mg/dL — ABNORMAL HIGH (ref 70–99)
Potassium: 3.7 meq/L (ref 3.5–5.1)
Sodium: 137 meq/L (ref 135–145)

## 2023-11-16 LAB — MICROALBUMIN / CREATININE URINE RATIO
Creatinine,U: 108.1 mg/dL
Microalb Creat Ratio: 14.3 mg/g (ref 0.0–30.0)
Microalb, Ur: 1.5 mg/dL (ref 0.0–1.9)

## 2023-11-16 LAB — TSH: TSH: 2.06 u[IU]/mL (ref 0.35–5.50)

## 2023-11-16 NOTE — Patient Instructions (Signed)
 Follow up in 4 months to recheck sugar We'll notify you of your lab results and make any changes if needed Continue to work on healthy diet and regular exercise- you're down 11 lbs!! Call with any questions or concerns Stay Safe!  Stay Healthy! GOOD LUCK WITH SURGERY!!!

## 2023-11-16 NOTE — Assessment & Plan Note (Signed)
Pt's PE WNL w/ exception of BMI.  UTD on colonoscopy, immunizations.  Check labs.  Anticipatory guidance provided.  

## 2023-11-16 NOTE — Progress Notes (Signed)
   Subjective:    Patient ID: Jordan Mcconnell, male    DOB: [birth year], 68 y.o.   MRN: [id number]  HPI CPE- UTD on foot exam, eye exam, Tdap, colonoscopy, PNA  Patient Care Team    Relationship Specialty Notifications Start End  Jess Morita, MD PCP - General Family Medicine  02/01/22   Jacqueline Matsu, MD PCP - Cardiology Cardiology Admissions 07/11/18   Jacqueline Matsu, MD Consulting Physician Cardiology  02/16/18   Quillian Brunt, MD Consulting Physician Pulmonary Disease  12/28/22     Health Maintenance  Topic Date Due   Zoster Vaccines- Shingrix (1 of 2) Never done   COVID-19 Vaccine (7 - 2024-25 season) 09/15/2023   Medicare Annual Wellness (AWV)  10/07/2023   Diabetic kidney evaluation - Urine ACR  12/16/2023   FOOT EXAM  12/16/2023   INFLUENZA VACCINE  02/03/2024   OPHTHALMOLOGY EXAM  02/21/2024   HEMOGLOBIN A1C  03/14/2024   Diabetic kidney evaluation - eGFR measurement  09/21/2024   DTaP/Tdap/Td (2 - Td or Tdap) 07/07/2026   Colonoscopy  01/12/2028   Pneumonia Vaccine 7+ Years old  Completed   Hepatitis C Screening  Completed   HPV VACCINES  Aged Out   Meningococcal B Vaccine  Aged Out      Review of Systems Patient reports no vision/hearing changes, anorexia, fever ,adenopathy, persistant/recurrent hoarseness, swallowing issues, chest pain, palpitations, edema, persistant/recurrent cough, hemoptysis, dyspnea (rest,exertional, paroxysmal nocturnal), gastrointestinal  bleeding (melena, rectal bleeding), abdominal pain, excessive heart burn, GU symptoms (dysuria, hematuria, voiding/incontinence issues) syncope, focal weakness, memory loss, skin/hair/nail changes, depression, anxiety, abnormal bruising/bleeding, musculoskeletal symptoms/signs.   + 11 lb weight loss + numbness of hands- positional    Objective:   Physical Exam General Appearance:    Alert, cooperative, no distress, appears stated age  Head:    Normocephalic, without obvious abnormality,  atraumatic  Eyes:    PERRL, conjunctiva/corneas clear, EOM's intact both eyes       Ears:    Normal TM's and external ear canals, both ears  Nose:   Nares normal, septum midline, mucosa normal, no drainage   or sinus tenderness  Throat:   Lips, mucosa, and tongue normal; teeth and gums normal  Neck:   Supple, symmetrical, trachea midline, no adenopathy;       thyroid :  No enlargement/tenderness/nodules  Back:     Symmetric, no curvature, ROM normal, no CVA tenderness  Lungs:     Clear to auscultation bilaterally, respirations unlabored  Chest wall:    No tenderness or deformity  Heart:    Regular rate and rhythm, S1 and S2 normal, no murmur, rub   or gallop  Abdomen:     Soft, non-tender, bowel sounds active all four quadrants,    no masses, no organomegaly  Genitalia:    deferred  Rectal:    Extremities:   Extremities normal, atraumatic, no cyanosis or edema  Pulses:   2+ and symmetric all extremities  Skin:   Skin color, texture, turgor normal, no rashes or lesions  Lymph nodes:   Cervical, supraclavicular, and axillary nodes normal  Neurologic:   CNII-XII intact. Normal strength, sensation and reflexes      throughout          Assessment & Plan:

## 2023-11-17 ENCOUNTER — Ambulatory Visit: Payer: Self-pay | Admitting: Family Medicine

## 2023-11-17 ENCOUNTER — Telehealth: Payer: Self-pay | Admitting: Family Medicine

## 2023-11-17 NOTE — Telephone Encounter (Signed)
-----   Message from Laymon Priest sent at 11/17/2023  7:34 AM EDT ----- Labs look great!!  We will add an A1C to make sure you are good to go for surgery! (A1C, dx DM)

## 2023-11-17 NOTE — Telephone Encounter (Signed)
 A1c add on has been sent to lab Pt has reviewed labs via MyChart

## 2023-11-17 NOTE — Telephone Encounter (Signed)
 Pt did not want to talk to the call center to let him know what his call was regarding to. I did get him connected and let him know that first step is them then its us  and sometimes they will need to know what is the issue to relay it to the office.  If we can't get to your nurse or doctor at the moment then the next step will result in having to document it your concerns,  send it over to your doctor and nurse and one of them will be reaching back to you at they're earliest convenience which is what I will have to do due to your doctor and nurse taking care of "in clinic pts" at the moment.   Pt wants a call back about his alc levels that took place his last visit.  Please advise, Thanks

## 2023-11-17 NOTE — Telephone Encounter (Signed)
 Called patient to clarify what was needed, patient stated he did not see an A1C added to his lab work and was just trying to make this gets completed before his pre surgical appointment. I did let patient know the A1C has been added to his labs and we will reach back out once we have the results. I did let patient know it should be back before the 23rd of may when his presurgical appointment is but if not we will give him a call. I did let patient know when these results come in he may view them on mychart and if we notice he has not then we will give him a call.

## 2023-11-21 ENCOUNTER — Encounter: Payer: Self-pay | Admitting: Adult Health

## 2023-11-21 ENCOUNTER — Telehealth: Payer: Self-pay | Admitting: Family Medicine

## 2023-11-21 ENCOUNTER — Ambulatory Visit: Admitting: Adult Health

## 2023-11-21 VITALS — BP 130/90 | HR 75 | Ht 69.0 in | Wt 246.0 lb

## 2023-11-21 DIAGNOSIS — J45909 Unspecified asthma, uncomplicated: Secondary | ICD-10-CM | POA: Insufficient documentation

## 2023-11-21 DIAGNOSIS — G4733 Obstructive sleep apnea (adult) (pediatric): Secondary | ICD-10-CM | POA: Diagnosis not present

## 2023-11-21 DIAGNOSIS — J310 Chronic rhinitis: Secondary | ICD-10-CM

## 2023-11-21 DIAGNOSIS — J4520 Mild intermittent asthma, uncomplicated: Secondary | ICD-10-CM | POA: Diagnosis not present

## 2023-11-21 DIAGNOSIS — Z01811 Encounter for preprocedural respiratory examination: Secondary | ICD-10-CM | POA: Insufficient documentation

## 2023-11-21 HISTORY — DX: Unspecified asthma, uncomplicated: J45.909

## 2023-11-21 NOTE — Telephone Encounter (Signed)
 Pt will like a call once his A1C results is released.

## 2023-11-21 NOTE — Progress Notes (Signed)
 @Patient  ID: Jordan Mcconnell, male    DOB: 1956-06-25, 68 y.o.   MRN: [id number]  Chief Complaint  Patient presents with   Follow-up    Referring provider: Jess Morita, MD  HPI: 68 year old male never smoker followed for shortness of breath after COVID-19 infection -working diagnosis -mild intermittent asthma/RAD.  Medical history significant for sleep apnea on nocturnal CPAP, diabetes, hypertension and hyperlipidemia  TEST/EVENTS :  Pulmonary function testing Nov 07, 2023 showed normal lung function  11/21/2023 Follow up : Asthma, Preop Risk Assessment  Patient presents for follow-up visit.  Last seen July 2024.  Patient has been treated for some suspected mild intermittent asthma versus reactive airways.  Had some shortness of breath and cough the past after COVID-19.  Patient says overall his breathing is doing well.  Pulmonary function testing repeated on Nov 07, 2023 showed normal lung function with no airflow obstruction or restriction.  Patient has rare albuterol  use.  Had previously been on Symbicort  with no perceived benefit.  Says he has some chronic allergies takes Claritin on occasion.  But overall says this has been doing well.  Previously on Singulair  but no longer taking.  No flare in symptoms off of Singulair . He remains active.  Patient sleep apnea is on CPAP.  Uses [facility].  Uses a nasal mask.  Wears his CPAP every single night.  Cannot sleep without it.  Feels that he benefits from CPAP with decreased daytime sleepiness.  CPAP download has been requested.  Patient is undergoing upcoming right knee revision surgery.  Is here for a preop pulmonary risk assessment.  Patient lives at home independently.  Is not on oxygen.  He is a never smoker as above pulmonary function testing done Nov 07, 2023 showed normal lung function.  CT coronary-lung fields were clear except for stable 5 mm right lower lobe nodule.     Allergies  Allergen Reactions   Penicillins Rash          Sulfa Antibiotics Rash   Codeine Nausea Only   Tramadol Nausea Only    Severe dizziness    Immunization History  Administered Date(s) Administered   Fluad Trivalent(High Dose 65+) 03/18/2023   Influenza Whole 05/18/2018   Influenza,inj,Quad PF,6+ Mos 04/27/2018, 03/26/2019, 04/19/2020   Influenza-Unspecified 04/19/2020, 04/03/2021, 04/08/2022   PFIZER(Purple Top)SARS-COV-2 Vaccination 10/09/2019, 10/30/2019, 05/17/2020, 04/08/2022   PNEUMOCOCCAL CONJUGATE-20 06/17/2023   Pfizer Covid-19 Vaccine Bivalent Booster 58yrs & up 12/23/2020   Pfizer(Comirnaty)Fall Seasonal Vaccine 12 years and older 03/18/2023   Tdap 07/07/2016    Past Medical History:  Diagnosis Date   Angiodysplasia of cecum    Arthritis    Asthma 11/21/2023   Cancer (HCC)    skin cancer on hand and head   Complication of anesthesia    one surgery pt. 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 8 g 3   APPLE CIDER VINEGAR PO Take 1,200 mg by mouth 2 (two) times daily.     Berberine Chloride (BERBERINE HCI PO) Take 1,200 mg by mouth 2 (two) times daily.     CHROMIUM PO Take 500 mg by mouth every evening.     empagliflozin  (JARDIANCE ) 10 MG TABS tablet Take 1 tablet (10 mg total) by mouth daily before breakfast. 90 tablet 3   hydrochlorothiazide  (HYDRODIURIL ) 25  MG tablet TAKE 1 TABLET (25 MG TOTAL) BY MOUTH DAILY. 90 tablet 1   loratadine (CLARITIN) 10 MG tablet Take 10 mg by mouth every evening.     metFORMIN  (GLUCOPHAGE ) 1000 MG tablet Take 1 tablet (1,000 mg total) by mouth 2 (two) times daily with a meal. 180 tablet 3   montelukast  (SINGULAIR ) 10 MG tablet Take 10 mg by mouth every evening.     omeprazole  (PRILOSEC) 40 MG capsule Take 1 capsule (40 mg total) by mouth daily. 90 capsule 0   rosuvastatin  (CRESTOR ) 20 MG tablet Take 1 tablet (20 mg total) by mouth daily. 90 tablet 3   No facility-administered medications prior to visit.     Review of Systems:   Constitutional:   No  weight loss, night sweats,  Fevers, chills, fatigue, or  lassitude.  HEENT:   No headaches,  Difficulty swallowing,  Tooth/dental problems, or  Sore throat,                No sneezing, itching, ear ache, +nasal congestion, post nasal drip,   CV:  No chest pain,  Orthopnea, PND, swelling in lower extremities, anasarca, dizziness, palpitations, syncope.   GI  No heartburn, indigestion, abdominal pain, nausea, vomiting, diarrhea, change in bowel habits, loss of appetite, bloody stools.   Resp: No shortness of breath with exertion or at rest.  No excess mucus, no productive cough,  No non-productive cough,  No coughing up of blood.  No change in color of mucus.  No wheezing.  No chest wall deformity  Skin: no rash or lesions.  GU: no dysuria, change in color of urine, no urgency or frequency.  No flank pain, no hematuria   MS:  No joint pain or swelling.  No decreased range of motion.  No back pain.    Physical Exam  BP (!) 130/90 (BP Location: Left Arm, Patient Position: Sitting, Cuff Size: Normal)   Pulse 75   Ht 5\' 9"  (1.753 m)   Wt 246 lb (111.6 kg)   SpO2 97%   BMI 36.33 kg/m   GEN: A/Ox3; pleasant , NAD, well nourished    HEENT:  [place]/AT,  E NOSE-clear, THROAT-clear, no lesions, no postnasal drip or exudate noted.   NECK:  Supple w/ fair ROM; no  JVD; normal carotid impulses w/o bruits; no thyromegaly or nodules palpated; no lymphadenopathy.    RESP  Clear  P & A; w/o, wheezes/ rales/ or rhonchi. no accessory muscle use, no dullness to percussion  CARD:  RRR, no m/r/g, no peripheral edema, pulses intact, no cyanosis or clubbing.  GI:   Soft & nt; nml bowel sounds; no organomegaly or masses detected.   Musco: Warm bil, no deformities or joint swelling noted.   Neuro: alert, no focal deficits noted.    Skin: Warm, no lesions or rashes    Lab Results:  CBC    Component Value Date/Time   WBC 8.3 11/16/2023 0837  RBC 5.58 11/16/2023 0837   HGB 15.6 11/16/2023 0837   HCT 47.7 11/16/2023 0837   PLT 199.0 11/16/2023 0837   MCV 85.4 11/16/2023 0837   MCH 28.0 09/19/2023 0900   MCHC 32.6 11/16/2023 0837   RDW 15.0 11/16/2023 0837   LYMPHSABS 2.5 11/16/2023 0837   MONOABS 0.6 11/16/2023 0837   EOSABS 0.5 11/16/2023 0837   BASOSABS 0.1 11/16/2023 0837    BMET    Component Value Date/Time   NA 137 11/16/2023 0837   K 3.7 11/16/2023 0837   CL 99 11/16/2023 0837   CO2 26 11/16/2023 0837   GLUCOSE 113 (H) 11/16/2023 0837   BUN 21 11/16/2023 0837   CREATININE 0.66 11/16/2023 0837   CREATININE 0.83 09/05/2020 1522   CALCIUM  9.5 11/16/2023 0837   GFRNONAA >60 09/19/2023 0900   GFRAA >60 07/06/2019 1238    BNP No results found for: "BNP"  ProBNP No results found for: "PROBNP"  Imaging: No results found.  Administration History     None          Latest Ref Rng & Units 11/07/2023    8:37 AM 04/15/2023    8:14 AM 11/06/2019   11:48 AM 08/04/2018    9:40 AM  PFT Results  FVC-Pre L 4.05  P 3.58  4.26  3.32   FVC-Predicted Pre % 92  P 81  94  68   FVC-Post L 4.01  P 3.60  4.19  3.38   FVC-Predicted Post % 91  P 81  92  69   Pre FEV1/FVC % % 82  P 85  80  82   Post FEV1/FCV % % 84  P 86  84  87   FEV1-Pre L 3.32  P 3.02  3.39  2.73   FEV1-Predicted Pre % 102  P 92  100  74   FEV1-Post L 3.37  P 3.09  3.50   2.94   DLCO uncorrected ml/min/mmHg 26.15  P 24.95  24.23  11.63   DLCO UNC% % 102  P 96  92  34   DLCO corrected ml/min/mmHg 26.15  P 24.81  24.03  11.00   DLCO COR %Predicted % 102  P 96  91  32   DLVA Predicted % 112  P 116  102  146   TLC L 5.85  P 6.02  7.77  5.59   TLC % Predicted % 85  P 88  114  77   RV % Predicted % 77  P 54  91  110     P Preliminary result    No results found for: "NITRICOXIDE"      Assessment & Plan:   Obstructive sleep apnea Reported excellent compliance on CPAP.  CPAP download was requested.  Has perceived benefit.  Continue on CPAP at bedtime.  Plan  Patient Instructions  Activity as tolerated.  Albuterol  inhaler As needed   Continue on Claritin 10mg  daily As needed   Continue on CPAP At bedtime, wear all night long  CPAP download. -[facility].  Follow up with Dr. Washington Hacker in 1 year and As needed    n   Chronic rhinitis Continue on Claritin daily as needed.  Asthma Mild intermittent asthma versus reactive airways.  Pulmonary function testing shows normal lung function and never smoker.  No airflow obstruction or restriction.  We reviewed his PFTs in detail.  Patient is very active.  May use albuterol  as needed.  Continue to control for triggers.  Plan  Patient Instructions  Activity as tolerated.  Albuterol  inhaler As needed   Continue on Claritin 10mg  daily As needed   Continue on CPAP At bedtime, wear all night long  CPAP download. -[facility].  Follow up with Dr. Washington Hacker in 1 year and As needed       Preop pulmonary/respiratory exam Pulmonary preop risk assessment.-Patient would be considered a mild surgical risk from pulmonary standpoint.  He has reported sleep apnea-says he is compliant with CPAP every single night.  Feels that he benefits from CPAP.  If needed in the postop setting can use CPAP as indicated.  Previously has had surgery with no reported difficulties with anesthesia.  From a lung aspect has mild intermittent asthma  that appears very well-controlled.  Recent pulmonary function testing Nov 07, 2023 showed normal lung function with no airflow obstruction or restriction.  Would use albuterol  as needed.  Major Pulmonary risks identified in the multifactorial risk analysis are but not limited to a) pneumonia; b) recurrent intubation risk; c) prolonged or recurrent acute respiratory failure needing mechanical ventilation; d) prolonged hospitalization; e) DVT/Pulmonary embolism; f) Acute Pulmonary edema  Recommend 1. Short duration of surgery as much as possible and avoid paralytic if possible Aggressive pulmonary toilet with o2, bronchodilatation, and incentive spirometry and early ambulation      Shaquille Janes, NP 11/21/2023

## 2023-11-21 NOTE — Patient Instructions (Signed)
 Activity as tolerated.  Albuterol  inhaler As needed   Continue on Claritin 10mg  daily As needed   Continue on CPAP At bedtime, wear all night long  CPAP download. -[facility].  Follow up with Dr. Washington Hacker in 1 year and As needed

## 2023-11-21 NOTE — Assessment & Plan Note (Signed)
 Mild intermittent asthma versus reactive airways.  Pulmonary function testing shows normal lung function and never smoker.  No airflow obstruction or restriction.  We reviewed his PFTs in detail.  Patient is very active.  May use albuterol  as needed.  Continue to control for triggers.  Plan  Patient Instructions  Activity as tolerated.  Albuterol  inhaler As needed   Continue on Claritin 10mg  daily As needed   Continue on CPAP At bedtime, wear all night long  CPAP download. -[facility].  Follow up with Dr. Washington Hacker in 1 year and As needed

## 2023-11-21 NOTE — Assessment & Plan Note (Signed)
 Pulmonary preop risk assessment.-Patient would be considered a mild surgical risk from pulmonary standpoint.  He has reported sleep apnea-says he is compliant with CPAP every single night.  Feels that he benefits from CPAP.  If needed in the postop setting can use CPAP as indicated.  Previously has had surgery with no reported difficulties with anesthesia.  From a lung aspect has mild intermittent asthma that appears very well-controlled.  Recent pulmonary function testing Nov 07, 2023 showed normal lung function with no airflow obstruction or restriction.  Would use albuterol  as needed.  Major Pulmonary risks identified in the multifactorial risk analysis are but not limited to a) pneumonia; b) recurrent intubation risk; c) prolonged or recurrent acute respiratory failure needing mechanical ventilation; d) prolonged hospitalization; e) DVT/Pulmonary embolism; f) Acute Pulmonary edema  Recommend 1. Short duration of surgery as much as possible and avoid paralytic if possible Aggressive pulmonary toilet with o2, bronchodilatation, and incentive spirometry and early ambulation

## 2023-11-21 NOTE — Assessment & Plan Note (Signed)
Continue on Claritin daily as needed

## 2023-11-21 NOTE — Assessment & Plan Note (Signed)
 Reported excellent compliance on CPAP.  CPAP download was requested.  Has perceived benefit.  Continue on CPAP at bedtime.  Plan  Patient Instructions  Activity as tolerated.  Albuterol  inhaler As needed   Continue on Claritin 10mg  daily As needed   Continue on CPAP At bedtime, wear all night long  CPAP download. -[facility].  Follow up with Dr. Washington Hacker in 1 year and As needed    n

## 2023-11-22 ENCOUNTER — Other Ambulatory Visit: Payer: Self-pay

## 2023-11-22 ENCOUNTER — Telehealth: Payer: Self-pay | Admitting: Adult Health

## 2023-11-22 DIAGNOSIS — G4733 Obstructive sleep apnea (adult) (pediatric): Secondary | ICD-10-CM | POA: Diagnosis not present

## 2023-11-22 DIAGNOSIS — E119 Type 2 diabetes mellitus without complications: Secondary | ICD-10-CM

## 2023-11-22 LAB — HEMOGLOBIN A1C: Hgb A1c MFr Bld: 6.5 % (ref 4.6–6.5)

## 2023-11-22 NOTE — Telephone Encounter (Signed)
 We sent add on. Checking with lab now

## 2023-11-22 NOTE — Telephone Encounter (Signed)
 Patient dropped off a download for his CPAP at the front desk. It has been placed in the Dr. Suellyn Emory mailbox.

## 2023-11-22 NOTE — Progress Notes (Addendum)
 Anesthesia Review:  PCP: Paulla Bossier- LOV 11/16/23  Cardiologist : Melodee Spruce O'neal L[facility] 10/25/23  Pulm- Tammy Parrett,NP- [facility] 11/21/23 - also in Media Tab   PPM/ ICD: Device Orders: Rep Notified:  Chest x-ray : EKG : 12/15/22  Echo : 12/27/22  CT Cors-12/26/22  Monitor- 2020  Stress test: 2010 PFT- 11/07/23  Cardiac Cath : 2020   Activity level:  Sleep Study/ CPAP : Fasting Blood Sugar :      / Checks Blood Sugar -- times a day:     DM- type2- does not check glucose at home    Hgba1c- 6.5 on 11/22/2023  Jardiance - hold for 72 hours prior to procedure- last dose on  12/03/23  Metformin - none am of surgery   Blood Thinner/ Instructions Audry Blinks Dose: ASA / Instructions/ Last Dose :    11/16/23- cbc , bmp - in epic   Surgery was cancelled in March due to hgb1c elevated.     PT's wife called and [facility] and wanted preop nurse to call on 12/05/23.  Called wife and verified with wife what meds pt to take am of surgery.  Wife voiced understanding.

## 2023-11-22 NOTE — Telephone Encounter (Signed)
 Looks like this was ordered by Hospital encounter are we going to be resulting this?

## 2023-11-22 NOTE — Telephone Encounter (Signed)
 Download picked up from front desk and placed in Tammy's review folder.  Tammy, please advise.  Thank you.

## 2023-11-22 NOTE — Patient Instructions (Addendum)
 SURGICAL WAITING ROOM VISITATION  Patients having surgery or a procedure may have no more than 2 support people in the waiting area - these visitors may rotate.    Children under the age of 51 must have an adult with them who is not the patient.   Visitors with respiratory illnesses are discouraged from visiting and should remain at home.  If the patient needs to stay at the hospital during part of their recovery, the visitor guidelines for inpatient rooms apply. Pre-op nurse will coordinate an appropriate time for 1 support person to accompany patient in pre-op.  This support person may not rotate.    Please refer to the [facility] website for the visitor guidelines for Inpatients (after your surgery is over and you are in a regular room).       Your procedure is scheduled on:  12/05/2023    Report to [facility] Main Entrance    Report to admitting at   1145 AM   Call this number if you have problems the morning of surgery [phone]   Do not eat food :After Midnight.   After Midnight you may have the following liquids until ____  1115__ AM  DAY OF SURGERY  Water  Non-Citrus Juices (without pulp, NO RED-Apple, White grape, White cranberry) Black Coffee (NO MILK/CREAM OR CREAMERS, sugar ok)  Clear Tea (NO MILK/CREAM OR CREAMERS, sugar ok) regular and decaf                             Plain Jell-O (NO RED)                                           Fruit ices (not with fruit pulp, NO RED)                                     Popsicles (NO RED)                                                               Sports drinks like Gatorade (NO      The day of surgery:  Drink ONE (1) Pre-Surgery Clear Ensure or G2 at  1115 AM  ( have completed by )  the morning of surgery. Drink in one sitting. Do not sip.  This drink was given to you during your hospital  pre-op appointment visit. Nothing else to drink after completing the  Pre-Surgery Clear Ensure or G2.          If you  have questions, please contact your surgeon's office.       Oral Hygiene is also important to reduce your risk of infection.                                    Remember - BRUSH YOUR TEETH THE MORNING OF SURGERY WITH YOUR REGULAR TOOTHPASTE  DENTURES WILL BE REMOVED PRIOR TO SURGERY PLEASE DO NOT APPLY "Poly grip" OR ADHESIVES!!!   Do  NOT smoke after Midnight   Stop all vitamins and herbal supplements 7 days before surgery.   Take these medicines the morning of surgery with A SIP OF WATER :  inhalers as usual and bring, omeprazole               Jardiance - hold for 72 hours prior to procedure- last dose on 12/01/23              Metformin - none am of surgery   DO NOT TAKE ANY ORAL DIABETIC MEDICATIONS DAY OF YOUR SURGERY  Bring CPAP mask and tubing day of surgery.                              You may not have any metal on your body including hair pins, jewelry, and body piercing             Do not wear make-up, lotions, powders, perfumes/cologne, or deodorant  Do not wear nail polish including gel and S&S, artificial/acrylic nails, or any other type of covering on natural nails including finger and toenails. If you have artificial nails, gel coating, etc. that needs to be removed by a nail salon please have this removed prior to surgery or surgery may need to be canceled/ delayed if the surgeon/ anesthesia feels like they are unable to be safely monitored.   Do not shave  48 hours prior to surgery.               Men may shave face and neck.   Do not bring valuables to the hospital. [facility] IS NOT             RESPONSIBLE   FOR VALUABLES.   Contacts, glasses, dentures or bridgework may not be worn into surgery.   Bring small overnight bag day of surgery.   DO NOT BRING YOUR HOME MEDICATIONS TO THE HOSPITAL. PHARMACY WILL DISPENSE MEDICATIONS LISTED ON YOUR MEDICATION LIST TO YOU DURING YOUR ADMISSION IN THE HOSPITAL!    Patients discharged on the day of surgery will not be  allowed to drive home.  Someone NEEDS to stay with you for the first 24 hours after anesthesia.   Special Instructions: Bring a copy of your healthcare power of attorney and living will documents the day of surgery if you haven't scanned them before.              Please read over the following fact sheets you were given: IF YOU HAVE QUESTIONS ABOUT YOUR PRE-OP INSTRUCTIONS PLEASE CALL [phone]   If you received a COVID test during your pre-op visit  it is requested that you wear a mask when out in public, stay away from anyone that may not be feeling well and notify your surgeon if you develop symptoms. If you test positive for Covid or have been in contact with anyone that has tested positive in the last 10 days please notify you surgeon.      Pre-operative 5 CHG Bath Instructions   You can play a key role in reducing the risk of infection after surgery. Your skin needs to be as free of germs as possible. You can reduce the number of germs on your skin by washing with CHG (chlorhexidine  gluconate) soap before surgery. CHG is an antiseptic soap that kills germs and continues to kill germs even after washing.   DO NOT use if you have an allergy to chlorhexidine /CHG or antibacterial soaps. If your  skin becomes reddened or irritated, stop using the CHG and notify one of our RNs at [phone].   Please shower with the CHG soap starting 4 days before surgery using the following schedule:     Please keep in mind the following:  DO NOT shave, including legs and underarms, starting the day of your first shower.   You may shave your face at any point before/day of surgery.  Place clean sheets on your bed the day you start using CHG soap. Use a clean washcloth (not used since being washed) for each shower. DO NOT sleep with pets once you start using the CHG.   CHG Shower Instructions:  If you choose to wash your hair and private area, wash first with your normal shampoo/soap.  After you use  shampoo/soap, rinse your hair and body thoroughly to remove shampoo/soap residue.  Turn the water  OFF and apply about 3 tablespoons (45 ml) of CHG soap to a CLEAN washcloth.  Apply CHG soap ONLY FROM YOUR NECK DOWN TO YOUR TOES (washing for 3-5 minutes)  DO NOT use CHG soap on face, private areas, open wounds, or sores.  Pay special attention to the area where your surgery is being performed.  If you are having back surgery, having someone wash your back for you may be helpful. Wait 2 minutes after CHG soap is applied, then you may rinse off the CHG soap.  Pat dry with a clean towel  Put on clean clothes/pajamas   If you choose to wear lotion, please use ONLY the CHG-compatible lotions on the back of this paper.     Additional instructions for the day of surgery: DO NOT APPLY any lotions, deodorants, cologne, or perfumes.   Put on clean/comfortable clothes.  Brush your teeth.  Ask your nurse before applying any prescription medications to the skin.      CHG Compatible Lotions   Aveeno Moisturizing lotion  Cetaphil Moisturizing Cream  Cetaphil Moisturizing Lotion  Clairol Herbal Essence Moisturizing Lotion, Dry Skin  Clairol Herbal Essence Moisturizing Lotion, Extra Dry Skin  Clairol Herbal Essence Moisturizing Lotion, Normal Skin  Curel Age Defying Therapeutic Moisturizing Lotion with Alpha Hydroxy  Curel Extreme Care Body Lotion  Curel Soothing Hands Moisturizing Hand Lotion  Curel Therapeutic Moisturizing Cream, Fragrance-Free  Curel Therapeutic Moisturizing Lotion, Fragrance-Free  Curel Therapeutic Moisturizing Lotion, Original Formula  Eucerin Daily Replenishing Lotion  Eucerin Dry Skin Therapy Plus Alpha Hydroxy Crme  Eucerin Dry Skin Therapy Plus Alpha Hydroxy Lotion  Eucerin Original Crme  Eucerin Original Lotion  Eucerin Plus Crme Eucerin Plus Lotion  Eucerin TriLipid Replenishing Lotion  Keri Anti-Bacterial Hand Lotion  Keri Deep Conditioning Original Lotion Dry  Skin Formula Softly Scented  Keri Deep Conditioning Original Lotion, Fragrance Free Sensitive Skin Formula  Keri Lotion Fast Absorbing Fragrance Free Sensitive Skin Formula  Keri Lotion Fast Absorbing Softly Scented Dry Skin Formula  Keri Original Lotion  Keri Skin Renewal Lotion Keri Silky Smooth Lotion  Keri Silky Smooth Sensitive Skin Lotion  Nivea Body Creamy Conditioning Oil  Nivea Body Extra Enriched Teacher, adult education Moisturizing Lotion Nivea Crme  Nivea Skin Firming Lotion  NutraDerm 30 Skin Lotion  NutraDerm Skin Lotion  NutraDerm Therapeutic Skin Cream  NutraDerm Therapeutic Skin Lotion  ProShield Protective Hand Cream  Provon moisturizing lotion

## 2023-11-24 NOTE — Telephone Encounter (Signed)
 Looks great .  100% compliance, daily usage at 8 hr , AHI 1.2 . No changes   Continue on same setting , keep up good work .

## 2023-11-25 ENCOUNTER — Other Ambulatory Visit: Payer: Self-pay

## 2023-11-25 ENCOUNTER — Encounter (HOSPITAL_COMMUNITY)
Admission: RE | Admit: 2023-11-25 | Discharge: 2023-11-25 | Disposition: A | Discharge status: Home / Self Care | Source: Ambulatory Visit | Attending: Orthopedic Surgery | Admitting: Orthopedic Surgery

## 2023-11-25 ENCOUNTER — Encounter (HOSPITAL_COMMUNITY): Payer: Self-pay

## 2023-11-25 ENCOUNTER — Telehealth: Payer: Self-pay

## 2023-11-25 VITALS — BP 134/79 | HR 73 | Temp 98.4°F | Resp 16 | Ht 69.0 in | Wt 240.0 lb

## 2023-11-25 DIAGNOSIS — Z01818 Encounter for other preprocedural examination: Secondary | ICD-10-CM | POA: Diagnosis present

## 2023-11-25 DIAGNOSIS — I2510 Atherosclerotic heart disease of native coronary artery without angina pectoris: Secondary | ICD-10-CM | POA: Insufficient documentation

## 2023-11-25 DIAGNOSIS — Z7984 Long term (current) use of oral hypoglycemic drugs: Secondary | ICD-10-CM | POA: Diagnosis not present

## 2023-11-25 DIAGNOSIS — I10 Essential (primary) hypertension: Secondary | ICD-10-CM | POA: Diagnosis not present

## 2023-11-25 DIAGNOSIS — J45909 Unspecified asthma, uncomplicated: Secondary | ICD-10-CM | POA: Diagnosis not present

## 2023-11-25 DIAGNOSIS — G4733 Obstructive sleep apnea (adult) (pediatric): Secondary | ICD-10-CM | POA: Insufficient documentation

## 2023-11-25 DIAGNOSIS — E119 Type 2 diabetes mellitus without complications: Secondary | ICD-10-CM | POA: Diagnosis not present

## 2023-11-25 DIAGNOSIS — K219 Gastro-esophageal reflux disease without esophagitis: Secondary | ICD-10-CM | POA: Insufficient documentation

## 2023-11-25 DIAGNOSIS — M25361 Other instability, right knee: Secondary | ICD-10-CM | POA: Insufficient documentation

## 2023-11-25 DIAGNOSIS — Z01812 Encounter for preprocedural laboratory examination: Secondary | ICD-10-CM | POA: Insufficient documentation

## 2023-11-25 LAB — SURGICAL PCR SCREEN
MRSA, PCR: NEGATIVE
Staphylococcus aureus: NEGATIVE

## 2023-11-25 LAB — GLUCOSE, CAPILLARY: Glucose-Capillary: 105 mg/dL — ABNORMAL HIGH (ref 70–99)

## 2023-11-25 LAB — TYPE AND SCREEN
ABO/RH(D): O POS
Antibody Screen: NEGATIVE

## 2023-11-25 NOTE — Telephone Encounter (Signed)
 Type of form received: Surgical Clearance   Additional comments: Can you fill this out based on recent physical or should we make another appt with patient for this to be completed?   Received by: Fax   Form should be Faxed/mailed to: (address/ fax #) faxed back to [phone]  Is patient requesting call for pickup: NO  Form placed:  In PCP sign folder   Attach charge sheet.  Provider will determine charge.  Individual made aware of 3-5 business day turn around No?

## 2023-11-25 NOTE — Telephone Encounter (Signed)
 Form completed and returned to Arnetta Lank to fax

## 2023-11-25 NOTE — Telephone Encounter (Signed)
 Given to front desk to be faxed.

## 2023-11-29 NOTE — Anesthesia Preprocedure Evaluation (Addendum)
 Anesthesia Evaluation  Patient identified by MRN, date of birth, ID band Patient awake    Reviewed: Allergy & Precautions, NPO status , Patient's Chart, lab work & pertinent test results  History of Anesthesia Complications (+) PONV and history of anesthetic complications  Airway Mallampati: II  TM Distance: >3 FB Neck ROM: Full    Dental no notable dental hx. (+) Teeth Intact, Dental Advisory Given   Pulmonary asthma , sleep apnea and Continuous Positive Airway Pressure Ventilation    Pulmonary exam normal breath sounds clear to auscultation       Cardiovascular hypertension, + CAD  Normal cardiovascular exam Rhythm:Regular Rate:Normal  Echo 12/23/2022:   IMPRESSIONS      1. Left ventricular ejection fraction, by estimation, is 60 to 65%. The left ventricle has normal function. The left ventricle has no regional wall motion abnormalities. There is mild left ventricular hypertrophy. Left ventricular diastolic parameters were normal.  2. Right ventricular systolic function is normal. The right ventricular size is mildly enlarged. Tricuspid regurgitation signal is inadequate for assessing PA pressure.  3. The mitral valve is normal in structure. No evidence of mitral valve regurgitation. No evidence of mitral stenosis.  4. The aortic valve is tricuspid. Aortic valve regurgitation is not visualized. No aortic stenosis is present.    Neuro/Psych    GI/Hepatic Neg liver ROS,GERD  ,,  Endo/Other  diabetes    Renal/GU Lab Results      Component                Value               Date                       K                        3.7                 11/16/2023                CO2                      26                  11/16/2023                BUN                      21                  11/16/2023                CREATININE               0.66                11/16/2023                    Musculoskeletal  (+)  Arthritis , Osteoarthritis,    Abdominal  (+) + obese  Peds  Hematology Lab Results      Component                Value               Date  WBC                      8.3                 11/16/2023                HGB                      15.6                11/16/2023                HCT                      47.7                11/16/2023                MCV                      85.4                11/16/2023                PLT                      199.0               11/16/2023              Anesthesia Other Findings   Reproductive/Obstetrics                             Anesthesia Physical Anesthesia Plan  ASA: 3  Anesthesia Plan: General   Post-op Pain Management: Ofirmev  IV (intra-op)*, Precedex and Dilaudid  IV   Induction: Intravenous  PONV Risk Score and Plan: 3 and Treatment may vary due to age or medical condition, Midazolam , Dexamethasone  and Ondansetron   Airway Management Planned: LMA  Additional Equipment: None  Intra-op Plan:   Post-operative Plan: Extubation in OR  Informed Consent: I have reviewed the patients History and Physical, chart, labs and discussed the procedure including the risks, benefits and alternatives for the proposed anesthesia with the patient or authorized representative who has indicated his/her understanding and acceptance.     Dental advisory given  Plan Discussed with: CRNA and Surgeon  Anesthesia Plan Comments: (See PAT note from 5/23)        Anesthesia Quick Evaluation

## 2023-11-29 NOTE — Telephone Encounter (Signed)
 Called and spoke with patient, advised of response from [facility] NP.  He verbalized understanding.  Nothing further needed.

## 2023-11-29 NOTE — Progress Notes (Signed)
 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  Past medical history significant for HTN, CAD, asthma, OSA (uses CPAP), GERD, diabetes, arthritis.  Prior anesthesia complications include PONV  Patient follows with cardiology for history of nonobstructive CAD by cath in 2020 and by coronary CTA in June 2024.  Last seen in clinic on 10/25/2023 by Dr. Rolm Clos.  He was previously cleared for surgery in February 2025 however surgery was canceled due to high A1c.  Cleared for surgery again:  "Nonobstructive CAD with <25% stenosis in major epicardial vessels. Coronary calcium  score 171, 62nd percentile. Echocardiogram normal. No angina or dyspnea. Focus on prevention to avoid progression. Cleared for surgery from cardiac standpoint."  Patient follows with pulmonology for history of asthma and OSA on CPAP.  Last seen on 11/21/2023.  Cleared for surgery:  Preop pulmonary/respiratory exam Pulmonary preop risk assessment.-Patient would be considered a mild surgical risk from pulmonary standpoint.  He has reported sleep apnea-says he is compliant with CPAP every single night.  Feels that he benefits from CPAP.  If needed in the postop setting can use CPAP as indicated.  Previously has had surgery with no reported difficulties with anesthesia.  From a lung aspect has mild intermittent asthma that appears very well-controlled.  Recent pulmonary function testing Nov 07, 2023 showed normal lung function with no airflow obstruction or restriction.  Would use albuterol  as needed.  A1c is improved.  It was 6.5 on 11/22/2023  LD  Jardiance : 5/31  VS: BP 134/79   Pulse 73   Temp 36.9 C (Oral)   Resp 16   Ht 5\' 9"  (1.753 m)   Wt 108.9 kg   SpO2 97%   BMI 35.44 kg/m   PROVIDERS: Jess Morita, MD   LABS: Labs reviewed: Acceptable for surgery. (all labs ordered are listed, but only abnormal results are displayed)  Labs Reviewed  GLUCOSE, CAPILLARY - Abnormal; Notable for the following components:      Result Value   Glucose-Capillary 105 (*)    All other components within normal limits  SURGICAL PCR SCREEN  TYPE AND SCREEN     IMAGES: CT abdomen pelvis 09/19/2023:  IMPRESSION: 1. No acute intra-abdominal or pelvic pathology. 2. Sigmoid diverticulosis. No bowel obstruction. Normal appendix. 3. Fatty liver.  EKG:   CV:  Echo 12/23/2022:  IMPRESSIONS    1. Left ventricular ejection fraction, by estimation, is 60 to 65%. The left ventricle has normal function. The left ventricle has no regional wall motion abnormalities. There is mild left ventricular hypertrophy. Left ventricular diastolic parameters were normal.  2. Right ventricular systolic function is normal. The right ventricular size is mildly enlarged. Tricuspid regurgitation signal is inadequate for assessing PA pressure.  3. The mitral valve is normal in structure. No evidence of mitral valve regurgitation. No evidence of mitral stenosis.  4. The aortic valve is tricuspid. Aortic valve regurgitation is not visualized. No aortic stenosis is present.  CTA coronary 12/22/2022:  IMPRESSION: 1. Minimal mixed non-obstructive CAD, CADRADS = 1.   2. Coronary artery calcium  score is 171, which places the patient in the  62nd percentile for age/race and sex-matched control.   3. Normal coronary origin with right dominance. 4. Cardiovascular risk factor modification is recommended.  Cardiac monitor 08/14/2018:  Sinus bradycardia, normal sinus rhythm and sinus tachycardia. The average heart rate was 75 bpm. The heart rate  ranged from 45 to 140 bpm. Occasional PACs and nonsustained atrial tachycardia up to 12 beats at 138 bpm. Occasional PVCs  Left heart cath 07/13/2018: Prox LAD lesion is 25% stenosed. Mid LAD lesion is 25% stenosed. The left ventricular systolic function is normal. The left ventricular ejection fraction is 55-65% by visual estimate. LV end diastolic pressure is mildly elevated. There is no aortic valve stenosis. Tortuous right subclavian makes catheter torquing difficult. If emergency cath was needed in the future, would consider different approach rather than right radial artery.   Nonobstructive CAD.  Continue preventive therapy.   Consider other sources of symptoms.   Past Medical History:  Diagnosis Date   Angiodysplasia of cecum    Arthritis    Asthma 11/21/2023   Cancer (HCC)    skin cancer on hand and head   Complication of anesthesia    one surgery pt. 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   Obesity    OSA (obstructive sleep apnea)    uses CPAP   PONV (postoperative nausea and vomiting)     Past Surgical History:  Procedure Laterality Date   BACK SURGERY  08/2015   bilateral knee replacements      COLONOSCOPY     ESOPHAGEAL DILATION     1 or 2 times   ESOPHAGOGASTRODUODENOSCOPY N/A 10/30/2013   Procedure: ESOPHAGOGASTRODUODENOSCOPY (EGD);  Surgeon: Garrett Kallman, MD;  Location: [facility];  Service: Endoscopy;  Laterality: N/A;   JOINT REPLACEMENT     bilateral knees   LEFT HEART CATH AND CORONARY ANGIOGRAPHY N/A 07/13/2018   Procedure: LEFT HEART CATH AND CORONARY ANGIOGRAPHY;  Surgeon: Lucendia Rusk, MD;  Location: [facility] IN[facility]ASIVE CV LAB;  Service: Cardiovascular;  Laterality: N/A;   left shoulder surgery      x 3   right shoulder surgery      x 3   SHOULDER  ARTHROSCOPY     Bilateral 3 times each shoulder   TOTAL HIP ARTHROPLASTY Left 04/16/2019   Procedure: TOTAL HIP ARTHROPLASTY ANTERIOR APPROACH;  Surgeon: Liliane Rei, MD;  Location: [facility];  Service: Orthopedics;  Laterality: Left;     MEDICATIONS:  albuterol  (VENTOLIN  HFA) 108 (90 Base) MCG/ACT inhaler   APPLE CIDER VINEGAR PO   Berberine Chloride (BERBERINE HCI PO)   CHROMIUM PO   empagliflozin  (JARDIANCE ) 10 MG TABS tablet   hydrochlorothiazide  (HYDRODIURIL ) 25 MG tablet   loratadine (CLARITIN) 10 MG tablet   metFORMIN  (GLUCOPHAGE ) 1000 MG tablet   montelukast  (SINGULAIR ) 10 MG tablet   omeprazole  (PRILOSEC) 40 MG capsule   rosuvastatin  (CRESTOR ) 20 MG tablet   No current facility-administered medications for this encounter.    Antoinette Kirschner [facility] Short Stay/Anesthesiology [facility] Phone [phone] 11/29/2023 11:46 AM

## 2023-12-07 ENCOUNTER — Ambulatory Visit: Payer: Self-pay | Admitting: Cardiovascular Disease

## 2023-12-07 ENCOUNTER — Inpatient Hospital Stay (HOSPITAL_COMMUNITY)
Admission: RE | Admit: 2023-12-07 | Discharge: 2023-12-08 | DRG: 489 | Disposition: A | Discharge status: Home / Self Care | Attending: Orthopedic Surgery | Admitting: Orthopedic Surgery

## 2023-12-07 ENCOUNTER — Encounter (HOSPITAL_COMMUNITY): Payer: Self-pay | Admitting: Orthopedic Surgery

## 2023-12-07 ENCOUNTER — Inpatient Hospital Stay (HOSPITAL_COMMUNITY): Payer: Self-pay | Admitting: Medical

## 2023-12-07 ENCOUNTER — Encounter (HOSPITAL_COMMUNITY)
Admission: RE | Disposition: A | Discharge status: Home / Self Care | Payer: Self-pay | Source: Home / Self Care | Attending: Orthopedic Surgery

## 2023-12-07 ENCOUNTER — Other Ambulatory Visit: Payer: Self-pay

## 2023-12-07 ENCOUNTER — Inpatient Hospital Stay (HOSPITAL_COMMUNITY): Payer: Self-pay | Admitting: Anesthesiology

## 2023-12-07 DIAGNOSIS — I10 Essential (primary) hypertension: Secondary | ICD-10-CM

## 2023-12-07 DIAGNOSIS — Z823 Family history of stroke: Secondary | ICD-10-CM

## 2023-12-07 DIAGNOSIS — G4733 Obstructive sleep apnea (adult) (pediatric): Secondary | ICD-10-CM | POA: Diagnosis present

## 2023-12-07 DIAGNOSIS — Z96642 Presence of left artificial hip joint: Secondary | ICD-10-CM | POA: Diagnosis present

## 2023-12-07 DIAGNOSIS — Z79899 Other long term (current) drug therapy: Secondary | ICD-10-CM

## 2023-12-07 DIAGNOSIS — Z880 Allergy status to penicillin: Secondary | ICD-10-CM

## 2023-12-07 DIAGNOSIS — I2510 Atherosclerotic heart disease of native coronary artery without angina pectoris: Secondary | ICD-10-CM

## 2023-12-07 DIAGNOSIS — G8918 Other acute postprocedural pain: Secondary | ICD-10-CM | POA: Diagnosis not present

## 2023-12-07 DIAGNOSIS — M1610 Unilateral primary osteoarthritis, unspecified hip: Secondary | ICD-10-CM | POA: Diagnosis present

## 2023-12-07 DIAGNOSIS — Z825 Family history of asthma and other chronic lower respiratory diseases: Secondary | ICD-10-CM

## 2023-12-07 DIAGNOSIS — Z885 Allergy status to narcotic agent status: Secondary | ICD-10-CM

## 2023-12-07 DIAGNOSIS — Y792 Prosthetic and other implants, materials and accessory orthopedic devices associated with adverse incidents: Secondary | ICD-10-CM | POA: Diagnosis present

## 2023-12-07 DIAGNOSIS — J45909 Unspecified asthma, uncomplicated: Secondary | ICD-10-CM

## 2023-12-07 DIAGNOSIS — Z889 Allergy status to unspecified drugs, medicaments and biological substances status: Secondary | ICD-10-CM

## 2023-12-07 DIAGNOSIS — T8484XA Pain due to internal orthopedic prosthetic devices, implants and grafts, initial encounter: Secondary | ICD-10-CM | POA: Diagnosis present

## 2023-12-07 DIAGNOSIS — Z96653 Presence of artificial knee joint, bilateral: Secondary | ICD-10-CM | POA: Diagnosis present

## 2023-12-07 DIAGNOSIS — Z96659 Presence of unspecified artificial knee joint: Principal | ICD-10-CM

## 2023-12-07 DIAGNOSIS — Z01818 Encounter for other preprocedural examination: Secondary | ICD-10-CM

## 2023-12-07 DIAGNOSIS — Z882 Allergy status to sulfonamides status: Secondary | ICD-10-CM

## 2023-12-07 DIAGNOSIS — T84062A Wear of articular bearing surface of internal prosthetic right knee joint, initial encounter: Secondary | ICD-10-CM | POA: Diagnosis present

## 2023-12-07 DIAGNOSIS — M25361 Other instability, right knee: Secondary | ICD-10-CM | POA: Diagnosis not present

## 2023-12-07 DIAGNOSIS — T84022A Instability of internal right knee prosthesis, initial encounter: Secondary | ICD-10-CM | POA: Diagnosis not present

## 2023-12-07 DIAGNOSIS — T84018A Broken internal joint prosthesis, other site, initial encounter: Secondary | ICD-10-CM

## 2023-12-07 DIAGNOSIS — Z6835 Body mass index (BMI) 35.0-35.9, adult: Secondary | ICD-10-CM

## 2023-12-07 DIAGNOSIS — Z800 Family history of malignant neoplasm of digestive organs: Secondary | ICD-10-CM

## 2023-12-07 DIAGNOSIS — K219 Gastro-esophageal reflux disease without esophagitis: Secondary | ICD-10-CM | POA: Diagnosis present

## 2023-12-07 DIAGNOSIS — E669 Obesity, unspecified: Secondary | ICD-10-CM | POA: Diagnosis present

## 2023-12-07 DIAGNOSIS — E785 Hyperlipidemia, unspecified: Secondary | ICD-10-CM | POA: Diagnosis present

## 2023-12-07 DIAGNOSIS — Z7984 Long term (current) use of oral hypoglycemic drugs: Secondary | ICD-10-CM

## 2023-12-07 DIAGNOSIS — T84092A Other mechanical complication of internal right knee prosthesis, initial encounter: Secondary | ICD-10-CM | POA: Diagnosis not present

## 2023-12-07 DIAGNOSIS — E1169 Type 2 diabetes mellitus with other specified complication: Secondary | ICD-10-CM | POA: Diagnosis present

## 2023-12-07 DIAGNOSIS — Z85828 Personal history of other malignant neoplasm of skin: Secondary | ICD-10-CM

## 2023-12-07 DIAGNOSIS — Z96651 Presence of right artificial knee joint: Secondary | ICD-10-CM | POA: Diagnosis not present

## 2023-12-07 LAB — GLUCOSE, CAPILLARY
Glucose-Capillary: 129 mg/dL — ABNORMAL HIGH (ref 70–99)
Glucose-Capillary: 134 mg/dL — ABNORMAL HIGH (ref 70–99)
Glucose-Capillary: 159 mg/dL — ABNORMAL HIGH (ref 70–99)
Glucose-Capillary: 183 mg/dL — ABNORMAL HIGH (ref 70–99)

## 2023-12-07 SURGERY — TOTAL KNEE REVISION
Anesthesia: General | Site: Knee | Laterality: Right

## 2023-12-07 MED ORDER — DEXAMETHASONE SODIUM PHOSPHATE 10 MG/ML IJ SOLN
INTRAMUSCULAR | Status: AC
Start: 2023-12-07 — End: ?
  Filled 2023-12-07: qty 1

## 2023-12-07 MED ORDER — OXYCODONE HCL 5 MG/5ML PO SOLN: 5.0000 mg | Freq: Once | ORAL | Status: DC | PRN

## 2023-12-07 MED ORDER — ONDANSETRON HCL 4 MG/2ML IJ SOLN
INTRAMUSCULAR | Status: AC
  Filled 2023-12-07: qty 2

## 2023-12-07 MED ORDER — ROSUVASTATIN CALCIUM 20 MG PO TABS
20.0000 mg | ORAL_TABLET | Freq: Every day | ORAL | Status: DC
  Administered 2023-12-08: 20 mg via ORAL
  Filled 2023-12-07: qty 1

## 2023-12-07 MED ORDER — MENTHOL 3 MG MT LOZG: 1.0000 | LOZENGE | OROMUCOSAL | Status: DC | PRN

## 2023-12-07 MED ORDER — PROPOFOL 10 MG/ML IV BOLUS
INTRAVENOUS | Status: DC | PRN
  Administered 2023-12-07: 40 mg via INTRAVENOUS
  Administered 2023-12-07: 160 mg via INTRAVENOUS

## 2023-12-07 MED ORDER — ROPIVACAINE HCL 5 MG/ML IJ SOLN
INTRAMUSCULAR | Status: DC | PRN
Start: 2023-12-07 — End: 2023-12-07
  Administered 2023-12-07: 30 mL via PERINEURAL

## 2023-12-07 MED ORDER — SODIUM CHLORIDE 0.9 % IV SOLN: INTRAVENOUS | Status: DC

## 2023-12-07 MED ORDER — ONDANSETRON HCL 4 MG/2ML IJ SOLN
4.0000 mg | Freq: Four times a day (QID) | INTRAMUSCULAR | Status: DC | PRN
Start: 2023-12-07 — End: 2023-12-08

## 2023-12-07 MED ORDER — LACTATED RINGERS IV SOLN: INTRAVENOUS | Status: DC | PRN

## 2023-12-07 MED ORDER — CLONIDINE HCL (ANALGESIA) 100 MCG/ML EP SOLN
EPIDURAL | Status: DC | PRN
Start: 2023-12-07 — End: 2023-12-07
  Administered 2023-12-07: 100 ug

## 2023-12-07 MED ORDER — INSULIN ASPART 100 UNIT/ML IJ SOLN: 0.0000 [IU] | Freq: Three times a day (TID) | INTRAMUSCULAR | Status: DC

## 2023-12-07 MED ORDER — SODIUM CHLORIDE (PF) 0.9 % IJ SOLN
INTRAMUSCULAR | Status: AC
  Filled 2023-12-07: qty 50

## 2023-12-07 MED ORDER — FENTANYL CITRATE (PF) 100 MCG/2ML IJ SOLN
INTRAMUSCULAR | Status: DC | PRN
  Administered 2023-12-07 (×2): 50 ug via INTRAVENOUS

## 2023-12-07 MED ORDER — DIPHENHYDRAMINE HCL 12.5 MG/5ML PO ELIX
12.5000 mg | ORAL_SOLUTION | ORAL | Status: DC | PRN
  Administered 2023-12-07: 25 mg via ORAL
  Filled 2023-12-07: qty 10

## 2023-12-07 MED ORDER — STERILE WATER FOR IRRIGATION IR SOLN
Status: DC | PRN
  Administered 2023-12-07: 2000 mL

## 2023-12-07 MED ORDER — ACETAMINOPHEN 10 MG/ML IV SOLN
INTRAVENOUS | Status: AC
  Filled 2023-12-07: qty 100

## 2023-12-07 MED ORDER — MIDAZOLAM HCL 2 MG/2ML IJ SOLN
0.5000 mg | Freq: Once | INTRAMUSCULAR | Status: AC
  Administered 2023-12-07: 2 mg via INTRAVENOUS
  Filled 2023-12-07: qty 2

## 2023-12-07 MED ORDER — POLYETHYLENE GLYCOL 3350 17 G PO PACK: 17.0000 g | PACK | Freq: Every day | ORAL | Status: DC | PRN

## 2023-12-07 MED ORDER — SODIUM CHLORIDE (PF) 0.9 % IJ SOLN
INTRAMUSCULAR | Status: AC
  Filled 2023-12-07: qty 10

## 2023-12-07 MED ORDER — PHENYLEPHRINE 80 MCG/ML (10ML) SYRINGE FOR IV PUSH (FOR BLOOD PRESSURE SUPPORT)
PREFILLED_SYRINGE | INTRAVENOUS | Status: DC | PRN
  Administered 2023-12-07 (×2): 160 ug via INTRAVENOUS

## 2023-12-07 MED ORDER — PROPOFOL 10 MG/ML IV BOLUS
INTRAVENOUS | Status: AC
  Filled 2023-12-07: qty 20

## 2023-12-07 MED ORDER — ASPIRIN 81 MG PO CHEW
81.0000 mg | CHEWABLE_TABLET | Freq: Two times a day (BID) | ORAL | Status: DC
  Administered 2023-12-08: 81 mg via ORAL
  Filled 2023-12-07: qty 1

## 2023-12-07 MED ORDER — LORATADINE 10 MG PO TABS
10.0000 mg | ORAL_TABLET | Freq: Every evening | ORAL | Status: DC
  Administered 2023-12-07: 10 mg via ORAL
  Filled 2023-12-07: qty 1

## 2023-12-07 MED ORDER — CEFAZOLIN SODIUM-DEXTROSE 2-4 GM/100ML-% IV SOLN
2.0000 g | Freq: Four times a day (QID) | INTRAVENOUS | Status: AC
  Administered 2023-12-07 – 2023-12-08 (×2): 2 g via INTRAVENOUS
  Filled 2023-12-07 (×2): qty 100

## 2023-12-07 MED ORDER — LACTATED RINGERS IV SOLN: INTRAVENOUS | Status: DC

## 2023-12-07 MED ORDER — CHLORHEXIDINE GLUCONATE 0.12 % MT SOLN: 15.0000 mL | Freq: Once | OROMUCOSAL | Status: DC

## 2023-12-07 MED ORDER — HYDROCHLOROTHIAZIDE 25 MG PO TABS
25.0000 mg | ORAL_TABLET | Freq: Every day | ORAL | Status: DC
  Administered 2023-12-08: 25 mg via ORAL
  Filled 2023-12-07: qty 1

## 2023-12-07 MED ORDER — FENTANYL CITRATE PF 50 MCG/ML IJ SOSY
25.0000 ug | PREFILLED_SYRINGE | Freq: Once | INTRAMUSCULAR | Status: AC
  Administered 2023-12-07: 50 ug via INTRAVENOUS
  Filled 2023-12-07: qty 2

## 2023-12-07 MED ORDER — INSULIN ASPART 100 UNIT/ML IJ SOLN: 0.0000 [IU] | Freq: Every day | INTRAMUSCULAR | Status: DC

## 2023-12-07 MED ORDER — ONDANSETRON HCL 4 MG/2ML IJ SOLN: 4.0000 mg | Freq: Once | INTRAMUSCULAR | Status: DC | PRN

## 2023-12-07 MED ORDER — DOCUSATE SODIUM 100 MG PO CAPS
100.0000 mg | ORAL_CAPSULE | Freq: Two times a day (BID) | ORAL | Status: DC
  Administered 2023-12-07 – 2023-12-08 (×2): 100 mg via ORAL
  Filled 2023-12-07 (×2): qty 1

## 2023-12-07 MED ORDER — BISACODYL 10 MG RE SUPP: 10.0000 mg | Freq: Every day | RECTAL | Status: DC | PRN

## 2023-12-07 MED ORDER — ACETAMINOPHEN 500 MG PO TABS
1000.0000 mg | ORAL_TABLET | Freq: Four times a day (QID) | ORAL | Status: DC
  Administered 2023-12-07 – 2023-12-08 (×3): 1000 mg via ORAL
  Filled 2023-12-07 (×3): qty 2

## 2023-12-07 MED ORDER — ONDANSETRON HCL 4 MG/2ML IJ SOLN
INTRAMUSCULAR | Status: DC | PRN
  Administered 2023-12-07: 4 mg via INTRAVENOUS

## 2023-12-07 MED ORDER — OXYCODONE HCL 5 MG PO TABS: 5.0000 mg | ORAL_TABLET | Freq: Once | ORAL | Status: DC | PRN

## 2023-12-07 MED ORDER — HYDROMORPHONE HCL 1 MG/ML IJ SOLN
0.5000 mg | INTRAMUSCULAR | Status: DC | PRN
  Administered 2023-12-08: 1 mg via INTRAVENOUS
  Filled 2023-12-07: qty 1

## 2023-12-07 MED ORDER — MIDAZOLAM HCL 5 MG/5ML IJ SOLN
INTRAMUSCULAR | Status: DC | PRN
Start: 2023-12-07 — End: 2023-12-07
  Administered 2023-12-07: 2 mg via INTRAVENOUS

## 2023-12-07 MED ORDER — INSULIN ASPART 100 UNIT/ML IJ SOLN: 0.0000 [IU] | INTRAMUSCULAR | Status: DC | PRN

## 2023-12-07 MED ORDER — HYDROMORPHONE HCL 1 MG/ML IJ SOLN
0.2500 mg | INTRAMUSCULAR | Status: DC | PRN
  Administered 2023-12-07 (×2): 0.5 mg via INTRAVENOUS

## 2023-12-07 MED ORDER — FENTANYL CITRATE (PF) 100 MCG/2ML IJ SOLN
INTRAMUSCULAR | Status: AC
  Filled 2023-12-07: qty 2

## 2023-12-07 MED ORDER — METOCLOPRAMIDE HCL 5 MG PO TABS: 5.0000 mg | ORAL_TABLET | Freq: Three times a day (TID) | ORAL | Status: DC | PRN

## 2023-12-07 MED ORDER — EMPAGLIFLOZIN 10 MG PO TABS
10.0000 mg | ORAL_TABLET | Freq: Every day | ORAL | Status: DC
  Administered 2023-12-08: 10 mg via ORAL
  Filled 2023-12-07: qty 1

## 2023-12-07 MED ORDER — ACETAMINOPHEN 325 MG PO TABS: 325.0000 mg | ORAL_TABLET | Freq: Four times a day (QID) | ORAL | Status: DC | PRN

## 2023-12-07 MED ORDER — FLEET ENEMA RE ENEM: 1.0000 | ENEMA | Freq: Once | RECTAL | Status: DC | PRN

## 2023-12-07 MED ORDER — EPHEDRINE SULFATE-NACL 50-0.9 MG/10ML-% IV SOSY
PREFILLED_SYRINGE | INTRAVENOUS | Status: DC | PRN
  Administered 2023-12-07: 10 mg via INTRAVENOUS

## 2023-12-07 MED ORDER — METOCLOPRAMIDE HCL 5 MG/ML IJ SOLN: 5.0000 mg | Freq: Three times a day (TID) | INTRAMUSCULAR | Status: DC | PRN

## 2023-12-07 MED ORDER — METHOCARBAMOL 1000 MG/10ML IJ SOLN: 500.0000 mg | Freq: Four times a day (QID) | INTRAMUSCULAR | Status: DC | PRN

## 2023-12-07 MED ORDER — PHENOL 1.4 % MT LIQD: 1.0000 | OROMUCOSAL | Status: DC | PRN

## 2023-12-07 MED ORDER — DEXMEDETOMIDINE HCL IN NACL 80 MCG/20ML IV SOLN
INTRAVENOUS | Status: DC | PRN
  Administered 2023-12-07: 8 ug via INTRAVENOUS
  Administered 2023-12-07: 4 ug via INTRAVENOUS
  Administered 2023-12-07: 8 ug via INTRAVENOUS

## 2023-12-07 MED ORDER — METHOCARBAMOL 500 MG PO TABS
500.0000 mg | ORAL_TABLET | Freq: Four times a day (QID) | ORAL | Status: DC | PRN
  Administered 2023-12-07: 500 mg via ORAL
  Filled 2023-12-07: qty 1

## 2023-12-07 MED ORDER — ORAL CARE MOUTH RINSE: 15.0000 mL | Freq: Once | OROMUCOSAL | Status: DC

## 2023-12-07 MED ORDER — HYDROMORPHONE HCL 1 MG/ML IJ SOLN
INTRAMUSCULAR | Status: DC | PRN
  Administered 2023-12-07 (×3): .4 mg via INTRAVENOUS
  Administered 2023-12-07: .2 mg via INTRAVENOUS
  Administered 2023-12-07: .4 mg via INTRAVENOUS
  Administered 2023-12-07: .2 mg via INTRAVENOUS

## 2023-12-07 MED ORDER — CHLORHEXIDINE GLUCONATE 0.12 % MT SOLN
15.0000 mL | Freq: Once | OROMUCOSAL | Status: AC
  Administered 2023-12-07: 15 mL via OROMUCOSAL

## 2023-12-07 MED ORDER — POVIDONE-IODINE 10 % EX SWAB: 2.0000 | Freq: Once | CUTANEOUS | Status: DC

## 2023-12-07 MED ORDER — CEFAZOLIN SODIUM-DEXTROSE 2-4 GM/100ML-% IV SOLN
2.0000 g | INTRAVENOUS | Status: AC
  Administered 2023-12-07: 2 g via INTRAVENOUS
  Filled 2023-12-07: qty 100

## 2023-12-07 MED ORDER — ACETAMINOPHEN 10 MG/ML IV SOLN: 1000.0000 mg | Freq: Once | INTRAVENOUS | Status: DC | PRN

## 2023-12-07 MED ORDER — HYDROMORPHONE HCL 1 MG/ML IJ SOLN
INTRAMUSCULAR | Status: AC
  Filled 2023-12-07: qty 2

## 2023-12-07 MED ORDER — 0.9 % SODIUM CHLORIDE (POUR BTL) OPTIME
TOPICAL | Status: DC | PRN
  Administered 2023-12-07: 1000 mL

## 2023-12-07 MED ORDER — MIDAZOLAM HCL 2 MG/2ML IJ SOLN
INTRAMUSCULAR | Status: AC
  Filled 2023-12-07: qty 2

## 2023-12-07 MED ORDER — HYDROMORPHONE HCL 2 MG/ML IJ SOLN
INTRAMUSCULAR | Status: AC
  Filled 2023-12-07: qty 1

## 2023-12-07 MED ORDER — HYDROMORPHONE HCL 2 MG PO TABS
2.0000 mg | ORAL_TABLET | ORAL | Status: DC | PRN
  Administered 2023-12-07 – 2023-12-08 (×3): 4 mg via ORAL
  Filled 2023-12-07 (×3): qty 2

## 2023-12-07 MED ORDER — LIDOCAINE HCL (PF) 2 % IJ SOLN
INTRAMUSCULAR | Status: AC
  Filled 2023-12-07: qty 5

## 2023-12-07 MED ORDER — PANTOPRAZOLE SODIUM 40 MG PO TBEC
40.0000 mg | DELAYED_RELEASE_TABLET | Freq: Every day | ORAL | Status: DC
  Administered 2023-12-08: 40 mg via ORAL
  Filled 2023-12-07: qty 1

## 2023-12-07 MED ORDER — ACETAMINOPHEN 10 MG/ML IV SOLN
1000.0000 mg | Freq: Four times a day (QID) | INTRAVENOUS | Status: DC
  Administered 2023-12-07: 1000 mg via INTRAVENOUS
  Filled 2023-12-07: qty 100

## 2023-12-07 MED ORDER — DEXAMETHASONE SODIUM PHOSPHATE 10 MG/ML IJ SOLN
8.0000 mg | Freq: Once | INTRAMUSCULAR | Status: AC
  Administered 2023-12-07: 10 mg via INTRAVENOUS

## 2023-12-07 MED ORDER — TRANEXAMIC ACID-NACL 1000-0.7 MG/100ML-% IV SOLN
1000.0000 mg | INTRAVENOUS | Status: AC
  Administered 2023-12-07: 1000 mg via INTRAVENOUS
  Filled 2023-12-07: qty 100

## 2023-12-07 MED ORDER — SODIUM CHLORIDE 0.9 % IR SOLN
Status: DC | PRN
  Administered 2023-12-07: 1000 mL

## 2023-12-07 MED ORDER — ALBUTEROL SULFATE (2.5 MG/3ML) 0.083% IN NEBU: 2.5000 mg | INHALATION_SOLUTION | Freq: Four times a day (QID) | RESPIRATORY_TRACT | Status: DC | PRN

## 2023-12-07 MED ORDER — ORAL CARE MOUTH RINSE: 15.0000 mL | Freq: Once | OROMUCOSAL | Status: AC

## 2023-12-07 MED ORDER — ONDANSETRON HCL 4 MG PO TABS: 4.0000 mg | ORAL_TABLET | Freq: Four times a day (QID) | ORAL | Status: DC | PRN

## 2023-12-07 MED ORDER — BUPIVACAINE LIPOSOME 1.3 % IJ SUSP
20.0000 mL | Freq: Once | INTRAMUSCULAR | Status: DC
Start: 2023-12-07 — End: 2023-12-07

## 2023-12-07 MED ORDER — BUPIVACAINE LIPOSOME 1.3 % IJ SUSP
INTRAMUSCULAR | Status: AC
  Filled 2023-12-07: qty 20

## 2023-12-07 MED ORDER — AMISULPRIDE (ANTIEMETIC) 5 MG/2ML IV SOLN: 10.0000 mg | Freq: Once | INTRAVENOUS | Status: DC | PRN

## 2023-12-07 SURGICAL SUPPLY — 42 items
BAG COUNTER SPONGE SURGICOUNT (BAG) IMPLANT
BAG ZIPLOCK 12X15 (MISCELLANEOUS) IMPLANT
BLADE SAG 18X100X1.27 (BLADE) IMPLANT
BLADE SAW SGTL 11.0X1.19X90.0M (BLADE) IMPLANT
BLADE SURG SZ10 CARB STEEL (BLADE) IMPLANT
BNDG ELASTIC 6INX 5YD STR LF (GAUZE/BANDAGES/DRESSINGS) ×2 IMPLANT
COVER SURGICAL LIGHT HANDLE (MISCELLANEOUS) ×2 IMPLANT
CUFF TRNQT CYL 34X4.125X (TOURNIQUET CUFF) ×2 IMPLANT
DERMABOND ADVANCED .7 DNX12 (GAUZE/BANDAGES/DRESSINGS) IMPLANT
DRAPE U-SHAPE 47X51 STRL (DRAPES) ×2 IMPLANT
DRSG AQUACEL AG ADV 3.5X10 (GAUZE/BANDAGES/DRESSINGS) ×2 IMPLANT
DURAPREP 26ML APPLICATOR (WOUND CARE) ×2 IMPLANT
ELECT REM PT RETURN 15FT ADLT (MISCELLANEOUS) ×2 IMPLANT
GLOVE BIO SURGEON STRL SZ 6.5 (GLOVE) IMPLANT
GLOVE BIO SURGEON STRL SZ7 (GLOVE) IMPLANT
GLOVE BIO SURGEON STRL SZ8 (GLOVE) ×2 IMPLANT
GLOVE BIOGEL PI IND STRL 6.5 (GLOVE) IMPLANT
GLOVE BIOGEL PI IND STRL 7.0 (GLOVE) IMPLANT
GLOVE BIOGEL PI IND STRL 8 (GLOVE) ×2 IMPLANT
GOWN STRL REUS W/ TWL LRG LVL3 (GOWN DISPOSABLE) ×4 IMPLANT
HOLDER FOLEY CATH W/STRAP (MISCELLANEOUS) IMPLANT
IMMOBILIZER KNEE 20 (SOFTGOODS) ×1 IMPLANT
IMMOBILIZER KNEE 20 THIGH 36 (SOFTGOODS) ×2 IMPLANT
KIT TURNOVER KIT A (KITS) IMPLANT
MANIFOLD NEPTUNE II (INSTRUMENTS) ×2 IMPLANT
NS IRRIG 1000ML POUR BTL (IV SOLUTION) ×2 IMPLANT
PACK TOTAL KNEE CUSTOM (KITS) ×2 IMPLANT
PADDING CAST COTTON 6X4 STRL (CAST SUPPLIES) ×4 IMPLANT
PLATE ROT INSERT 15MM SIZE 5 (Plate) IMPLANT
PROTECTOR NERVE ULNAR (MISCELLANEOUS) ×2 IMPLANT
SET HNDPC FAN SPRY TIP SCT (DISPOSABLE) ×2 IMPLANT
SPIKE FLUID TRANSFER (MISCELLANEOUS) IMPLANT
SUT MNCRL AB 4-0 PS2 18 (SUTURE) ×2 IMPLANT
SUT VIC AB 2-0 CT1 TAPERPNT 27 (SUTURE) ×6 IMPLANT
SUTURE STRATFX 0 PDS 27 VIOLET (SUTURE) ×2 IMPLANT
SWAB COLLECTION DEVICE MRSA (MISCELLANEOUS) IMPLANT
SWAB CULTURE ESWAB REG 1ML (MISCELLANEOUS) IMPLANT
TOWER CARTRIDGE SMART MIX (DISPOSABLE) IMPLANT
TRAY FOLEY MTR SLVR 16FR STAT (SET/KITS/TRAYS/PACK) ×2 IMPLANT
TUBE SUCTION HIGH CAP CLEAR NV (SUCTIONS) ×2 IMPLANT
WATER STERILE IRR 1000ML POUR (IV SOLUTION) ×2 IMPLANT
WRAP KNEE MAXI GEL POST OP (GAUZE/BANDAGES/DRESSINGS) IMPLANT

## 2023-12-07 NOTE — Anesthesia Postprocedure Evaluation (Signed)
 Anesthesia Post Note  Patient: Jordan Mcconnell  Procedure(s) Performed: Right knee polyethylene versus total knee arthroplasty revision (Right: Knee)     Patient location during evaluation: PACU Anesthesia Type: General Level of consciousness: awake and alert Pain management: pain level controlled Vital Signs Assessment: post-procedure vital signs reviewed and stable Respiratory status: spontaneous breathing, nonlabored ventilation, respiratory function stable and patient connected to nasal cannula oxygen Cardiovascular status: blood pressure returned to baseline and stable Postop Assessment: no apparent nausea or vomiting Anesthetic complications: no  No notable events documented.  Last Vitals:  Vitals:   12/07/23 1600 12/07/23 1618  BP: (!) 157/78 138/76  Pulse: 72 71  Resp: (!) 21 19  Temp:  36.5 C  SpO2: 95%     Last Pain:  Vitals:   12/07/23 1636  TempSrc:   PainSc: 8                  Rosalita Combe

## 2023-12-07 NOTE — Brief Op Note (Signed)
 12/07/2023  2:32 PM  PATIENT:  Jordan Mcconnell  68 y.o. male  PRE-OPERATIVE DIAGNOSIS:  unstable right total knee arthroplasty  POST-OPERATIVE DIAGNOSIS:  unstable right total knee arthroplasty  PROCEDURE:  Procedure(s): Right knee polyethylene versus total knee arthroplasty revision (Right)  SURGEON:  Surgeons and Role:    Liliane Rei, MD - Primary  PHYSICIAN ASSISTANT:   ASSISTANTS: Brinton Canavan, PA-C   ANESTHESIA:   regional and general  EBL:  25 ml  BLOOD ADMINISTERED:none  DRAINS: none   LOCAL MEDICATIONS USED:  NONE  COUNTS:  YES  TOURNIQUET:   Total Tourniquet Time Documented: Thigh (Right) - 26 minutes Total: Thigh (Right) - 26 minutes   DICTATION: .Other Dictation: Dictation Number [id number]  PLAN OF CARE: Admit to inpatient   PATIENT DISPOSITION:  PACU - hemodynamically stable.

## 2023-12-07 NOTE — Progress Notes (Signed)
 Pt's wife has cpap hose and mask.

## 2023-12-07 NOTE — Anesthesia Procedure Notes (Signed)
 Procedure Name: LMA Insertion Date/Time: 12/07/2023 1:33 PM  Performed by: Josetta Niece, CRNAPre-anesthesia Checklist: Patient identified, Emergency Drugs available, Suction available and Patient being monitored Patient Re-evaluated:Patient Re-evaluated prior to induction Oxygen Delivery Method: Circle System Utilized Preoxygenation: Pre-oxygenation with 100% oxygen Induction Type: IV induction Ventilation: Mask ventilation without difficulty LMA: LMA inserted LMA Size: 4.0 Number of attempts: 1 Airway Equipment and Method: Bite block Placement Confirmation: positive ETCO2 Tube secured with: Tape Dental Injury: Teeth and Oropharynx as per pre-operative assessment

## 2023-12-07 NOTE — Progress Notes (Signed)
 Orthopedic Tech Progress Note Patient Details:  Jordan Mcconnell Dec 24, 1955 [id number]  CPM Right Knee CPM Right Knee: On Right Knee Flexion (Degrees): 40 Right Knee Extension (Degrees): 10  Post Interventions Patient Tolerated: Well Instructions Provided: Adjustment of device, Care of device  Leodis Rainwater 12/07/2023, 3:43 PM

## 2023-12-07 NOTE — Transfer of Care (Signed)
 Immediate Anesthesia Transfer of Care Note  Patient: Jordan Mcconnell  Procedure(s) Performed: Right knee polyethylene versus total knee arthroplasty revision (Right: Knee)  Patient Location: PACU  Anesthesia Type:General and Regional  Level of Consciousness: awake  Airway & Oxygen Therapy: Patient Spontanous Breathing and Patient connected to face mask oxygen  Post-op Assessment: Report given to RN and Post -op Vital signs reviewed and stable  Post vital signs: Reviewed and stable  Last Vitals:  Vitals Value Taken Time  BP 164/91 12/07/23 1509  Temp    Pulse 79 12/07/23 1511  Resp 19 12/07/23 1511  SpO2 100 % 12/07/23 1511  Vitals shown include unfiled device data.  Last Pain:  Vitals:   12/07/23 1305  TempSrc:   PainSc: 0-No pain      Patients Stated Pain Goal: 5 (12/07/23 1111)  Complications: No notable events documented.

## 2023-12-07 NOTE — Discharge Instructions (Signed)
Jordan Gross, MD Total Joint Specialist [facility] [address] [place], [place] [address] [phone]  TOTAL KNEE REVISION POSTOPERATIVE DIRECTIONS  Knee Rehabilitation, Guidelines Following Surgery  Results after knee surgery are often greatly improved when you follow the exercise, range of motion and muscle strengthening exercises prescribed by your doctor. Safety measures are also important to protect the knee from further injury. If any of these exercises cause you to have increased pain or swelling in your knee joint, decrease the amount until you are comfortable again and slowly increase them. If you have problems or questions, call your caregiver or physical therapist for advice.   BLOOD CLOT PREVENTION Take an 81 mg Aspirin two times a day for three weeks following surgery. Then take an 81 mg Aspirin once a day for three weeks. Then discontinue Aspirin. You may resume your vitamins/supplements upon discharge from the hospital. Do not take any NSAIDs (Advil, Aleve, Ibuprofen, Meloxicam, etc.) until you are 3 weeks out from surgery  HOME CARE INSTRUCTIONS  Remove items at home which could result in a fall. This includes throw rugs or furniture in walking pathways.  ICE to the affected knee as much as tolerated. Icing helps control swelling. If the swelling is well controlled you will be more comfortable and rehab easier. Continue to use ice on the knee for pain and swelling from surgery. You may notice swelling that will progress down to the foot and ankle. This is normal after surgery. Elevate the leg when you are not up walking on it.    Continue to use the breathing machine which will help keep your temperature down. It is common for your temperature to cycle up and down following surgery, especially at night when you are not up moving around and exerting yourself. The breathing machine keeps your lungs expanded and your temperature down. Do not  place pillow under the operative knee, focus on keeping the knee straight while resting  DIET You may resume your previous home diet once you are discharged from the hospital.  DRESSING / WOUND CARE / SHOWERING Keep your bulky bandage on for 2 days. On the third post-operative day you may remove the Ace bandage and gauze. There is a waterproof adhesive bandage on your skin which will stay in place until your first follow-up appointment. Once you remove this you will not need to place another bandage You may begin showering 3 days following surgery, but do not submerge the incision under water.  ACTIVITY For the first 5 days, the key is rest and control of pain and swelling Do your home exercises twice a day starting on post-operative day 3. On the days you go to physical therapy, just do the home exercises once that day. You should rest, ice and elevate the leg for 50 minutes out of every hour. Get up and walk/stretch for 10 minutes per hour. After 5 days you can increase your activity slowly as tolerated. Walk with your walker as instructed. Use the walker until you are comfortable transitioning to a cane. Walk with the cane in the opposite hand of the operative leg. You may discontinue the cane once you are comfortable and walking steadily. Avoid periods of inactivity such as sitting longer than an hour when not asleep. This helps prevent blood clots.  You may discontinue the knee immobilizer once you are able to perform a straight leg raise while lying down. You may resume a sexual relationship in one month or when  given the OK by your doctor.  You may return to work once you are cleared by your doctor.  Do not drive a car for 6 weeks or until released by your surgeon.  Do not drive while taking narcotics.  TED HOSE STOCKINGS Wear the elastic stockings on both legs for three weeks following surgery during the day. You may remove them at night for sleeping.  WEIGHT BEARING Weight bearing  as tolerated with assist device (walker, cane, etc) as directed, use it as long as suggested by your surgeon or therapist, typically at least 4-6 weeks.  POSTOPERATIVE CONSTIPATION PROTOCOL Constipation - defined medically as fewer than three stools per week and severe constipation as less than one stool per week.  One of the most common issues patients have following surgery is constipation.  Even if you have a regular bowel pattern at home, your normal regimen is likely to be disrupted due to multiple reasons following surgery.  Combination of anesthesia, postoperative narcotics, change in appetite and fluid intake all can affect your bowels.  In order to avoid complications following surgery, here are some recommendations in order to help you during your recovery period.  Colace (docusate) - Pick up an over-the-counter form of Colace or another stool softener and take twice a day as long as you are requiring postoperative pain medications.  Take with a full glass of water daily.  If you experience loose stools or diarrhea, hold the colace until you stool forms back up. If your symptoms do not get better within 1 week or if they get worse, check with your doctor. Dulcolax (bisacodyl) - Pick up over-the-counter and take as directed by the product packaging as needed to assist with the movement of your bowels.  Take with a full glass of water.  Use this product as needed if not relieved by Colace only.  MiraLax (polyethylene glycol) - Pick up over-the-counter to have on hand. MiraLax is a solution that will increase the amount of water in your bowels to assist with bowel movements.  Take as directed and can mix with a glass of water, juice, soda, coffee, or tea. Take if you go more than two days without a movement. Do not use MiraLax more than once per day. Call your doctor if you are still constipated or irregular after using this medication for 7 days in a row.  If you continue to have problems with  postoperative constipation, please contact the office for further assistance and recommendations.  If you experience "the worst abdominal pain ever" or develop nausea or vomiting, please contact the office immediatly for further recommendations for treatment.  ITCHING If you experience itching with your medications, try taking only a single pain pill, or even half a pain pill at a time.  You can also use Benadryl over the counter for itching or also to help with sleep.   MEDICATIONS See your medication summary on the "After Visit Summary" that the nursing staff will review with you prior to discharge.  You may have some home medications which will be placed on hold until you complete the course of blood thinner medication.  It is important for you to complete the blood thinner medication as prescribed by your surgeon.  Continue your approved medications as instructed at time of discharge.  PRECAUTIONS If you experience chest pain or shortness of breath - call 911 immediately for transfer to the hospital emergency department.  If you develop a fever greater that 101 F, purulent drainage  from wound, increased redness or drainage from wound, foul odor from the wound/dressing, or calf pain - CONTACT YOUR SURGEON.                                                   FOLLOW-UP APPOINTMENTS Make sure you keep all of your appointments after your operation with your surgeon and caregivers. You should call the office at the above phone number and make an appointment for approximately two weeks after the date of your surgery or on the date instructed by your surgeon outlined in the "After Visit Summary".  RANGE OF MOTION AND STRENGTHENING EXERCISES  Rehabilitation of the knee is important following a knee injury or an operation. After just a few days of immobilization, the muscles of the thigh which control the knee become weakened and shrink (atrophy). Knee exercises are designed to build up the tone and strength  of the thigh muscles and to improve knee motion. Often times heat used for twenty to thirty minutes before working out will loosen up your tissues and help with improving the range of motion but do not use heat for the first two weeks following surgery. These exercises can be done on a training (exercise) mat, on the floor, on a table or on a bed. Use what ever works the best and is most comfortable for you Knee exercises include:  Leg Lifts - While your knee is still immobilized in a splint or cast, you can do straight leg raises. Lift the leg to 60 degrees, hold for 3 sec, and slowly lower the leg. Repeat 10-20 times 2-3 times daily. Perform this exercise against resistance later as your knee gets better.  Quad and Hamstring Sets - Tighten up the muscle on the front of the thigh (Quad) and hold for 5-10 sec. Repeat this 10-20 times hourly. Hamstring sets are done by pushing the foot backward against an object and holding for 5-10 sec. Repeat as with quad sets.  Leg Slides: Lying on your back, slowly slide your foot toward your buttocks, bending your knee up off the floor (only go as far as is comfortable). Then slowly slide your foot back down until your leg is flat on the floor again. Angel Wings: Lying on your back spread your legs to the side as far apart as you can without causing discomfort.  A rehabilitation program following serious knee injuries can speed recovery and prevent re-injury in the future due to weakened muscles. Contact your doctor or a physical therapist for more information on knee rehabilitation.   POST-OPERATIVE OPIOID TAPER INSTRUCTIONS: It is important to wean off of your opioid medication as soon as possible. If you do not need pain medication after your surgery it is ok to stop day one. Opioids include: Codeine, Hydrocodone(Norco, Vicodin), Oxycodone(Percocet, oxycontin) and hydromorphone amongst others.  Long term and even short term use of opiods can cause: Increased pain  response Dependence Constipation Depression Respiratory depression And more.  Withdrawal symptoms can include Flu like symptoms Nausea, vomiting And more Techniques to manage these symptoms Hydrate well Eat regular healthy meals Stay active Use relaxation techniques(deep breathing, meditating, yoga) Do Not substitute Alcohol to help with tapering If you have been on opioids for less than two weeks and do not have pain than it is ok to stop all together.  Plan to wean  off of opioids This plan should start within one week post op of your joint replacement. Maintain the same interval or time between taking each dose and first decrease the dose.  Cut the total daily intake of opioids by one tablet each day Next start to increase the time between doses. The last dose that should be eliminated is the evening dose.   IF YOU ARE TRANSFERRED TO A SKILLED REHAB FACILITY If the patient is transferred to a skilled rehab facility following release from the hospital, a list of the current medications will be sent to the facility for the patient to continue.  When discharged from the skilled rehab facility, please have the facility set up the patient's Home Health Physical Therapy prior to being released. Also, the skilled facility will be responsible for providing the patient with their medications at time of release from the facility to include their pain medication, the muscle relaxants, and their blood thinner medication. If the patient is still at the rehab facility at time of the two week follow up appointment, the skilled rehab facility will also need to assist the patient in arranging follow up appointment in our office and any transportation needs.  MAKE SURE YOU:  Understand these instructions.  Get help right away if you are not doing well or get worse.   DENTAL ANTIBIOTICS:  In most cases prophylactic antibiotics for Dental procdeures after total joint surgery are not  necessary.  Exceptions are as follows:  1. History of prior total joint infection  2. Severely immunocompromised (Organ Transplant, cancer chemotherapy, Rheumatoid biologic meds such as Humera)  3. Poorly controlled diabetes (A1C &gt; 8.0, blood glucose over 200)  If you have one of these conditions, contact your surgeon for an antibiotic prescription, prior to your dental procedure.    Pick up stool softner and laxative for home use following surgery while on pain medications. Do not submerge incision under water. Please use good hand washing techniques while changing dressing each day. May shower starting three days after surgery. Please use a clean towel to pat the incision dry following showers. Continue to use ice for pain and swelling after surgery. Do not use any lotions or creams on the incision until instructed by your surgeon.

## 2023-12-07 NOTE — Progress Notes (Signed)
 Orthopedic Tech Progress Note Patient Details:  Jordan Mcconnell 1956/04/30 [id number]  CPM Right Knee CPM Right Knee: Off Right Knee Flexion (Degrees): 40 Right Knee Extension (Degrees): 10  Post Interventions Patient Tolerated: Well Instructions Provided: Adjustment of device, Care of device  Leodis Rainwater 12/07/2023, 7:31 PM

## 2023-12-07 NOTE — Progress Notes (Signed)
 Patient is requesting additional pain medication. Paged [facility].

## 2023-12-07 NOTE — Plan of Care (Signed)
   Problem: Activity: Goal: Risk for activity intolerance will decrease Outcome: Progressing   Problem: Pain Managment: Goal: General experience of comfort will improve and/or be controlled Outcome: Progressing   Problem: Safety: Goal: Ability to remain free from injury will improve Outcome: Progressing

## 2023-12-07 NOTE — Anesthesia Procedure Notes (Signed)
 Anesthesia Regional Block: Adductor canal block   Pre-Anesthetic Checklist: , timeout performed,  Correct Patient, Correct Site, Correct Laterality,  Correct Procedure, Correct Position, site marked,  Risks and benefits discussed,  Surgical consent,  Pre-op evaluation,  At surgeon's request and post-op pain management  Laterality: Lower and Left  Prep: chloraprep       Needles:  Injection technique: Single-shot  Needle Type: Echogenic Needle     Needle Length: 9cm  Needle Gauge: 22     Additional Needles:   Procedures:,,,, ultrasound used (permanent image in chart),,    Narrative:  Start time: 12/07/2023 12:58 PM End time: 12/07/2023 1:04 PM Injection made incrementally with aspirations every 5 mL.  Performed by: Personally  Anesthesiologist: Rosalita Combe, MD  Additional Notes: Block assessed prior to surgery. Pt tolerated procedure well.

## 2023-12-07 NOTE — Interval H&P Note (Signed)
 History and Physical Interval Note:  12/07/2023 10:57 AM  Jordan Mcconnell  has presented today for surgery, with the diagnosis of unstable right total knee arthroplasty.  The various methods of treatment have been discussed with the patient and family. After consideration of risks, benefits and other options for treatment, the patient has consented to  Procedure(s): Right knee polyethylene versus total knee arthroplasty revision (Right) as a surgical intervention.  The patient's history has been reviewed, patient examined, no change in status, stable for surgery.  I have reviewed the patient's chart and labs.  Questions were answered to the patient's satisfaction.     Samuel Crock Alexxia Stankiewicz

## 2023-12-08 ENCOUNTER — Other Ambulatory Visit: Payer: Self-pay

## 2023-12-08 ENCOUNTER — Telehealth: Payer: Self-pay

## 2023-12-08 ENCOUNTER — Encounter (HOSPITAL_COMMUNITY): Payer: Self-pay | Admitting: Orthopedic Surgery

## 2023-12-08 ENCOUNTER — Other Ambulatory Visit (HOSPITAL_COMMUNITY): Payer: Self-pay

## 2023-12-08 DIAGNOSIS — Z889 Allergy status to unspecified drugs, medicaments and biological substances status: Secondary | ICD-10-CM | POA: Diagnosis not present

## 2023-12-08 DIAGNOSIS — Z7984 Long term (current) use of oral hypoglycemic drugs: Secondary | ICD-10-CM | POA: Diagnosis not present

## 2023-12-08 DIAGNOSIS — Z880 Allergy status to penicillin: Secondary | ICD-10-CM | POA: Diagnosis not present

## 2023-12-08 DIAGNOSIS — E669 Obesity, unspecified: Secondary | ICD-10-CM | POA: Diagnosis not present

## 2023-12-08 DIAGNOSIS — Z79899 Other long term (current) drug therapy: Secondary | ICD-10-CM | POA: Diagnosis not present

## 2023-12-08 DIAGNOSIS — T84062A Wear of articular bearing surface of internal prosthetic right knee joint, initial encounter: Secondary | ICD-10-CM | POA: Diagnosis not present

## 2023-12-08 DIAGNOSIS — M1610 Unilateral primary osteoarthritis, unspecified hip: Secondary | ICD-10-CM | POA: Diagnosis not present

## 2023-12-08 DIAGNOSIS — Z96642 Presence of left artificial hip joint: Secondary | ICD-10-CM | POA: Diagnosis not present

## 2023-12-08 DIAGNOSIS — J45909 Unspecified asthma, uncomplicated: Secondary | ICD-10-CM | POA: Diagnosis not present

## 2023-12-08 DIAGNOSIS — Z6835 Body mass index (BMI) 35.0-35.9, adult: Secondary | ICD-10-CM | POA: Diagnosis not present

## 2023-12-08 DIAGNOSIS — T8484XA Pain due to internal orthopedic prosthetic devices, implants and grafts, initial encounter: Secondary | ICD-10-CM | POA: Diagnosis not present

## 2023-12-08 DIAGNOSIS — T84022A Instability of internal right knee prosthesis, initial encounter: Secondary | ICD-10-CM | POA: Diagnosis not present

## 2023-12-08 DIAGNOSIS — Z823 Family history of stroke: Secondary | ICD-10-CM | POA: Diagnosis not present

## 2023-12-08 DIAGNOSIS — K219 Gastro-esophageal reflux disease without esophagitis: Secondary | ICD-10-CM | POA: Diagnosis not present

## 2023-12-08 DIAGNOSIS — G4733 Obstructive sleep apnea (adult) (pediatric): Secondary | ICD-10-CM | POA: Diagnosis not present

## 2023-12-08 DIAGNOSIS — Z885 Allergy status to narcotic agent status: Secondary | ICD-10-CM | POA: Diagnosis not present

## 2023-12-08 DIAGNOSIS — Y792 Prosthetic and other implants, materials and accessory orthopedic devices associated with adverse incidents: Secondary | ICD-10-CM | POA: Diagnosis present

## 2023-12-08 DIAGNOSIS — I2510 Atherosclerotic heart disease of native coronary artery without angina pectoris: Secondary | ICD-10-CM | POA: Diagnosis not present

## 2023-12-08 DIAGNOSIS — Z825 Family history of asthma and other chronic lower respiratory diseases: Secondary | ICD-10-CM | POA: Diagnosis not present

## 2023-12-08 DIAGNOSIS — Z96653 Presence of artificial knee joint, bilateral: Secondary | ICD-10-CM | POA: Diagnosis not present

## 2023-12-08 DIAGNOSIS — E785 Hyperlipidemia, unspecified: Secondary | ICD-10-CM | POA: Diagnosis not present

## 2023-12-08 DIAGNOSIS — M25361 Other instability, right knee: Secondary | ICD-10-CM | POA: Diagnosis present

## 2023-12-08 DIAGNOSIS — Z85828 Personal history of other malignant neoplasm of skin: Secondary | ICD-10-CM | POA: Diagnosis not present

## 2023-12-08 DIAGNOSIS — I10 Essential (primary) hypertension: Secondary | ICD-10-CM | POA: Diagnosis not present

## 2023-12-08 DIAGNOSIS — E1169 Type 2 diabetes mellitus with other specified complication: Secondary | ICD-10-CM | POA: Diagnosis not present

## 2023-12-08 DIAGNOSIS — Z800 Family history of malignant neoplasm of digestive organs: Secondary | ICD-10-CM | POA: Diagnosis not present

## 2023-12-08 LAB — CBC
HCT: 44.1 % (ref 39.0–52.0)
Hemoglobin: 14.4 g/dL (ref 13.0–17.0)
MCH: 28.5 pg (ref 26.0–34.0)
MCHC: 32.7 g/dL (ref 30.0–36.0)
MCV: 87.3 fL (ref 80.0–100.0)
Platelets: 194 10*3/uL (ref 150–400)
RBC: 5.05 MIL/uL (ref 4.22–5.81)
RDW: 14.2 % (ref 11.5–15.5)
WBC: 12.6 10*3/uL — ABNORMAL HIGH (ref 4.0–10.5)
nRBC: 0 % (ref 0.0–0.2)

## 2023-12-08 LAB — GLUCOSE, CAPILLARY: Glucose-Capillary: 149 mg/dL — ABNORMAL HIGH (ref 70–99)

## 2023-12-08 LAB — BASIC METABOLIC PANEL WITH GFR
Anion gap: 10 (ref 5–15)
BUN: 14 mg/dL (ref 8–23)
CO2: 22 mmol/L (ref 22–32)
Calcium: 8.9 mg/dL (ref 8.9–10.3)
Chloride: 101 mmol/L (ref 98–111)
Creatinine, Ser: 0.51 mg/dL — ABNORMAL LOW (ref 0.61–1.24)
GFR, Estimated: >60 mL/min (ref >60)
Glucose, Bld: 158 mg/dL — ABNORMAL HIGH (ref 70–99)
Potassium: 4.2 mmol/L (ref 3.5–5.1)
Sodium: 133 mmol/L — ABNORMAL LOW (ref 135–145)

## 2023-12-08 MED ORDER — ASPIRIN 81 MG PO CHEW
81.0000 mg | CHEWABLE_TABLET | Freq: Two times a day (BID) | ORAL | 0 refills | Status: DC
  Filled 2023-12-08: qty 63, 32d supply, fill #0

## 2023-12-08 MED ORDER — ASPIRIN 81 MG PO CHEW: 81.0000 mg | CHEWABLE_TABLET | Freq: Two times a day (BID) | ORAL | 0 refills | Status: AC

## 2023-12-08 MED ORDER — HYDROMORPHONE HCL 1 MG/ML IJ SOLN
0.5000 mg | INTRAMUSCULAR | Status: DC | PRN
  Administered 2023-12-08: 1 mg via INTRAVENOUS
  Filled 2023-12-08: qty 1

## 2023-12-08 MED ORDER — HYDROMORPHONE HCL 2 MG PO TABS
2.0000 mg | ORAL_TABLET | Freq: Four times a day (QID) | ORAL | 0 refills | Status: DC | PRN
  Filled 2023-12-08: qty 42, 6d supply, fill #0

## 2023-12-08 MED ORDER — KETOROLAC TROMETHAMINE 30 MG/ML IJ SOLN
30.0000 mg | Freq: Once | INTRAMUSCULAR | Status: AC
  Administered 2023-12-08: 30 mg via INTRAVENOUS
  Filled 2023-12-08: qty 1

## 2023-12-08 MED ORDER — HYDROMORPHONE HCL 2 MG PO TABS: 2.0000 mg | ORAL_TABLET | Freq: Four times a day (QID) | ORAL | 0 refills | Status: DC | PRN

## 2023-12-08 MED ORDER — METHOCARBAMOL 500 MG PO TABS
500.0000 mg | ORAL_TABLET | Freq: Four times a day (QID) | ORAL | 0 refills | Status: DC | PRN
  Filled 2023-12-08: qty 40, 10d supply, fill #0

## 2023-12-08 MED ORDER — METHOCARBAMOL 500 MG PO TABS: 500.0000 mg | ORAL_TABLET | Freq: Four times a day (QID) | ORAL | 0 refills | Status: DC | PRN

## 2023-12-08 MED ORDER — ONDANSETRON 4 MG PO TBDP
4.0000 mg | ORAL_TABLET | Freq: Four times a day (QID) | ORAL | 0 refills | Status: DC | PRN
  Filled 2023-12-08: qty 20, 5d supply, fill #0

## 2023-12-08 MED ORDER — ONDANSETRON HCL 4 MG PO TABS: 4.0000 mg | ORAL_TABLET | Freq: Four times a day (QID) | ORAL | 0 refills | Status: DC | PRN

## 2023-12-08 NOTE — Discharge Summary (Signed)
 Physician Discharge Summary   Patient ID: DEARIUS HOFFMANN MRN: [id number] DOB/AGE: July 11, 1955 68 y.o.  Admit date: 12/07/2023 Discharge date: 12/08/2023  Primary Diagnosis: Unstable right total knee arthroplasty   Admission Diagnoses:  Past Medical History:  Diagnosis Date   Angiodysplasia of cecum    Arthritis    Asthma 11/21/2023   Cancer (HCC)    skin cancer on hand and head   Complication of anesthesia    one surgery pt. 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   Weight as of this encounter: 109 kg.  Procedure:  Procedure(s) (LRB): Right knee polyethylene versus total knee arthroplasty revision (Right)   Consults: None  HPI: The patient is a 67 year old male who had a right total knee arthroplasty done many years ago.  He presented recently with the knee becoming unstable and with recurrent effusions and pain.  Infection workup was negative.  Bone scan showed no evidence of any prosthetic loosening.  It was felt he was developing fluid secondary to polyethylene wear from the instability.  He presents now for polyethylene versus total knee arthroplasty revision.  Laboratory Data: Admission on 12/07/2023, Discharged on 12/08/2023  Component Date Value Ref Range Status   Glucose-Capillary 12/07/2023 129 (H)  70 - 99 mg/dL Final   Glucose reference range applies only to samples taken after fasting for at least 8 hours.   Glucose-Capillary 12/07/2023 134 (H)  70 - 99 mg/dL Final    Glucose reference range applies only to samples taken after fasting for at least 8 hours.   WBC 12/08/2023 12.6 (H)  4.0 - 10.5 K/uL Final   RBC 12/08/2023 5.05  4.22 - 5.81 MIL/uL Final   Hemoglobin 12/08/2023 14.4  13.0 - 17.0 g/dL Final   HCT 78/29/5621 44.1  39.0 - 52.0 % Final   MCV 12/08/2023 87.3  80.0 - 100.0 fL Final   MCH 12/08/2023 28.5  26.0 - 34.0 pg Final   MCHC 12/08/2023 32.7  30.0 - 36.0 g/dL Final   RDW 30/86/5784 14.2  11.5 - 15.5 % Final   Platelets 12/08/2023 194  150 - 400 K/uL Final   nRBC 12/08/2023 0.0  0.0 - 0.2 % Final   Performed at [facility], [address] [place], [place] [address]   Sodium 12/08/2023 133 (L)  135 - 145 mmol/L Final   Potassium 12/08/2023 4.2  3.5 - 5.1 mmol/L Final   Chloride 12/08/2023 101  98 - 111 mmol/L Final   CO2 12/08/2023 22  22 - 32 mmol/L Final   Glucose, Bld 12/08/2023 158 (H)  70 - 99 mg/dL Final   Glucose reference range applies only to samples taken after fasting for at least 8 hours.   BUN 12/08/2023 14  8 - 23 mg/dL Final   Creatinine, Ser 12/08/2023 0.51 (L)  0.61 - 1.24 mg/dL Final   Calcium  12/08/2023 8.9  8.9 - 10.3 mg/dL Final  GFR, Estimated 12/08/2023 >60  >60 mL/min Final   Comment: (NOTE) Calculated using the CKD-EPI Creatinine Equation (2021)    Anion gap 12/08/2023 10  5 - 15 Final   Performed at [facility], [address], [place], [place] [address]   Glucose-Capillary 12/07/2023 159 (H)  70 - 99 mg/dL Final   Glucose reference range applies only to samples taken after fasting for at least 8 hours.   Glucose-Capillary 12/07/2023 183 (H)  70 - 99 mg/dL Final   Glucose reference range applies only to samples taken after fasting for at least 8 hours.   Glucose-Capillary 12/08/2023 149 (H)  70 - 99 mg/dL Final   Glucose reference range applies only to samples taken after fasting for at least 8 hours.  Hospital Outpatient Visit on 11/25/2023  Component Date Value Ref  Range Status   ABO/RH(D) 11/25/2023 O POS   Final   Antibody Screen 11/25/2023 NEG   Final   Sample Expiration 11/25/2023 12/09/2023,2359   Final   Extend sample reason 11/25/2023    Final                   Value:NO TRANSFUSIONS OR PREGNANCY IN THE PAST 3 MONTHS Performed at [facility], [address] [place], [place] [address]    Glucose-Capillary 11/25/2023 105 (H)  70 - 99 mg/dL Final   Glucose reference range applies only to samples taken after fasting for at least 8 hours.   MRSA, PCR 11/25/2023 NEGATIVE  NEGATIVE Final   Staphylococcus aureus 11/25/2023 NEGATIVE  NEGATIVE Final   Comment: (NOTE) The Xpert SA Assay (FDA approved for NASAL specimens in patients 35 years of age and older), is one component of a comprehensive surveillance program. It is not intended to diagnose infection nor to guide or monitor treatment. Performed at [facility], [address], [place], [place] [address]   Office Visit on 11/16/2023  Component Date Value Ref Range Status   Microalb, Ur 11/16/2023 1.5  0.0 - 1.9 mg/dL Final   Creatinine,U 03/47/4259 108.1  mg/dL Final   Microalb Creat Ratio 11/16/2023 14.3  0.0 - 30.0 mg/g Final   Cholesterol 11/16/2023 89  0 - 200 mg/dL Final   ATP III Classification       Desirable:  < 200 mg/dL               Borderline High:  200 - 239 mg/dL          High:  > = 563 mg/dL   Triglycerides 87/56/4332 140.0  0.0 - 149.0 mg/dL Final   Normal:  <951 mg/dLBorderline High:  150 - 199 mg/dL   HDL 88/41/6606 30.16 (L)  >01.09 mg/dL Final   VLDL 32/35/5732 28.0  0.0 - 40.0 mg/dL Final   LDL Cholesterol 11/16/2023 33  0 - 99 mg/dL Final   Total CHOL/HDL Ratio 11/16/2023 3   Final                  Men          Women1/2 Average Risk     3.4          3.3Average Risk          5.0          4.42X Average Risk          9.6          7.13X Average Risk  15.0          11.0                       NonHDL 11/16/2023 60.50   Final    NOTE:  Non-HDL goal should be 30 mg/dL higher than patient's LDL goal (i.e. LDL goal of < 70 mg/dL, would have non-HDL goal of < 100 mg/dL)   Sodium 28/41/3244 010  135 - 145 mEq/L Final   Potassium 11/16/2023 3.7  3.5 - 5.1 mEq/L Final   Chloride 11/16/2023 99  96 - 112 mEq/L Final   CO2 11/16/2023 26  19 - 32 mEq/L Final   Glucose, Bld 11/16/2023 113 (H)  70 - 99 mg/dL Final   BUN 27/25/3664 21  6 - 23 mg/dL Final   Creatinine, Ser 11/16/2023 0.66  0.40 - 1.50 mg/dL Final   GFR 40/34/7425 97.21  >60.00 mL/min Final   Calculated using the CKD-EPI Creatinine Equation (2021)   Calcium  11/16/2023 9.5  8.4 - 10.5 mg/dL Final   TSH 95/63/8756 2.06  0.35 - 5.50 uIU/mL Final   Total Bilirubin 11/16/2023 0.6  0.2 - 1.2 mg/dL Final   Bilirubin, Direct 11/16/2023 0.1  0.0 - 0.3 mg/dL Final   Alkaline Phosphatase 11/16/2023 80  39 - 117 U/L Final   AST 11/16/2023 24  0 - 37 U/L Final   ALT 11/16/2023 36  0 - 53 U/L Final   Total Protein 11/16/2023 7.2  6.0 - 8.3 g/dL Final   Albumin 43/32/9518 4.7  3.5 - 5.2 g/dL Final   WBC 84/16/6063 8.3  4.0 - 10.5 K/uL Final   RBC 11/16/2023 5.58  4.22 - 5.81 Mil/uL Final   Hemoglobin 11/16/2023 15.6  13.0 - 17.0 g/dL Final   HCT 01/60/1093 47.7  39.0 - 52.0 % Final   MCV 11/16/2023 85.4  78.0 - 100.0 fl Final   MCHC 11/16/2023 32.6  30.0 - 36.0 g/dL Final   RDW 23/55/7322 15.0  11.5 - 15.5 % Final   Platelets 11/16/2023 199.0  150.0 - 400.0 K/uL Final   Neutrophils Relative % 11/16/2023 55.8  43.0 - 77.0 % Final   Lymphocytes Relative 11/16/2023 30.0  12.0 - 46.0 % Final   Monocytes Relative 11/16/2023 7.1  3.0 - 12.0 % Final   Eosinophils Relative 11/16/2023 6.4 (H)  0.0 - 5.0 % Final   Basophils Relative 11/16/2023 0.7  0.0 - 3.0 % Final   Neutro Abs 11/16/2023 4.6  1.4 - 7.7 K/uL Final   Lymphs Abs 11/16/2023 2.5  0.7 - 4.0 K/uL Final   Monocytes Absolute 11/16/2023 0.6  0.1 - 1.0 K/uL Final   Eosinophils Absolute 11/16/2023 0.5  0.0 - 0.7 K/uL Final    Basophils Absolute 11/16/2023 0.1  0.0 - 0.1 K/uL Final   PSA 11/16/2023 0.86  0.10 - 4.00 ng/ml Final   Test performed using Access [employer]br[employer]ch PSA Assay, a parmagnetic partical, chemiluminecent immunoassay.   Hgb A1c MFr Bld 11/22/2023 6.5  4.6 - 6.5 % Final   Glycemic Control Guidelines for People with Diabetes:Non Diabetic:  <6%Goal of Therapy: <7%Additional Action Suggested:  >8%   Clinical Support on 11/07/2023  Component Date Value Ref Range Status   FVC-Pre 11/07/2023 4.05  L Final   FVC-%Pred-Pre 11/07/2023 92  % Final   FVC-Post 11/07/2023 4.01  L Final   FVC-%Pred-Post 11/07/2023 91  % Final   FVC-%Change-Post 11/07/2023 -1  % Final   FEV1-Pre 11/07/2023 3.32  L  Final   FEV1-%Pred-Pre 11/07/2023 102  % Final   FEV1-Post 11/07/2023 3.37  L Final   FEV1-%Pred-Post 11/07/2023 104  % Final   FEV1-%Change-Post 11/07/2023 1  % Final   FEV6-Pre 11/07/2023 4.05  L Final   FEV6-%Pred-Pre 11/07/2023 98  % Final   FEV6-Post 11/07/2023 4.01  L Final   FEV6-%Pred-Post 11/07/2023 96  % Final   FEV6-%Change-Post 11/07/2023 -1  % Final   Pre FEV1/FVC ratio 11/07/2023 82  % Final   FEV1FVC-%Pred-Pre 11/07/2023 110  % Final   Post FEV1/FVC ratio 11/07/2023 84  % Final   FEV1FVC-%Change-Post 11/07/2023 2  % Final   Pre FEV6/FVC Ratio 11/07/2023 100  % Final   FEV6FVC-%Pred-Pre 11/07/2023 105  % Final   Post FEV6/FVC ratio 11/07/2023 100  % Final   FEV6FVC-%Pred-Post 11/07/2023 105  % Final   FEV6FVC-%Change-Post 11/07/2023 0  % Final   FEF 25-75 Pre 11/07/2023 3.44  L/sec Final   FEF2575-%Pred-Pre 11/07/2023 136  % Final   FEF 25-75 Post 11/07/2023 3.84  L/sec Final   FEF2575-%Pred-Post 11/07/2023 151  % Final   FEF2575-%Change-Post 11/07/2023 11  % Final   RV 11/07/2023 1.81  L Final   RV % pred 11/07/2023 77  % Final   TLC 11/07/2023 5.85  L Final   TLC % pred 11/07/2023 85  % Final   DLCO unc 11/07/2023 26.15  ml/min/mmHg Final   DLCO unc % pred 11/07/2023 102  % Final   DLCO  cor 11/07/2023 26.15  ml/min/mmHg Final   DLCO cor % pred 11/07/2023 102  % Final   DL/VA 40/98/1191 4.78  ml/min/mmHg/L Final   DL/VA % pred 29/56/2130 865  % Final     X-Rays:No results found.  EKG: Orders placed or performed in visit on 12/15/22   EKG 12-Lead     Hospital Course: BRAXTIN BAMBA is a 68 y.o. who was admitted to [facility]. They were brought to the operating room on 12/07/2023 and underwent Procedure(s): Right knee polyethylene versus total knee arthroplasty revision.  Patient tolerated the procedure well and was later transferred to the recovery room and then to the orthopaedic floor for postoperative care. They were given PO and IV analgesics for pain control following their surgery. They were given 24 hours of postoperative antibiotics of  Anti-infectives (From admission, onward)    Start     Dose/Rate Route Frequency Ordered Stop   12/07/23 2000  ceFAZolin (ANCEF) IVPB 2g/100 mL premix        2 g 200 mL/hr over 30 Minutes Intravenous Every 6 hours 12/07/23 1612 12/08/23 0212   12/07/23 1130  ceFAZolin (ANCEF) IVPB 2g/100 mL premix        2 g 200 mL/hr over 30 Minutes Intravenous On call to O.R. 12/07/23 1119 12/07/23 1326      and started on DVT prophylaxis in the form of Aspirin .   PT and OT were ordered for total joint protocol. Discharge planning consulted to help with postop disposition and equipment needs.  Patient had a fair night on the evening of surgery. Patient was seen during rounds and was ready to go home pending progress with therapy. He worked with therapy on POD #1 and was meeting his goals. Pt was discharged to home later that day in stable condition.  Diet: Regular diet Activity: WBAT Follow-up: in 2 weeks Disposition: Home Discharged Condition: stable   Discharge Instructions     Call MD / Call 911   Complete  by: As directed    If you experience chest pain or shortness of breath, CALL 911 and be transported to the hospital  emergency room.  If you develope a fever above 101 F, pus (white drainage) or increased drainage or redness at the wound, or calf pain, call your surgeon's office.   Change dressing   Complete by: As directed    You may remove the bulky bandage (ACE wrap and gauze) two days after surgery. You will have an adhesive waterproof bandage underneath. Leave this in place until your first follow-up appointment.   Constipation Prevention   Complete by: As directed    Drink plenty of fluids.  Prune juice may be helpful.  You may use a stool softener, such as Colace (over the counter) 100 mg twice a day.  Use MiraLax  (over the counter) for constipation as needed.   Diet - low sodium heart healthy   Complete by: As directed    Do not put a pillow under the knee. Place it under the heel.   Complete by: As directed    Driving restrictions   Complete by: As directed    No driving for two weeks   Post-operative opioid taper instructions:   Complete by: As directed    POST-OPERATIVE OPIOID TAPER INSTRUCTIONS: It is important to wean off of your opioid medication as soon as possible. If you do not need pain medication after your surgery it is ok to stop day one. Opioids include: Codeine, Hydrocodone(Norco, Vicodin), Oxycodone (Percocet, oxycontin ) and hydromorphone  amongst others.  Long term and even short term use of opiods can cause: Increased pain response Dependence Constipation Depression Respiratory depression And more.  Withdrawal symptoms can include Flu like symptoms Nausea, vomiting And more Techniques to manage these symptoms Hydrate well Eat regular healthy meals Stay active Use relaxation techniques(deep breathing, meditating, yoga) Do Not substitute Alcohol to help with tapering If you have been on opioids for less than two weeks and do not have pain than it is ok to stop all together.  Plan to wean off of opioids This plan should start within one week post op of your joint  replacement. Maintain the same interval or time between taking each dose and first decrease the dose.  Cut the total daily intake of opioids by one tablet each day Next start to increase the time between doses. The last dose that should be eliminated is the evening dose.      TED hose   Complete by: As directed    Use stockings (TED hose) for three weeks on both leg(s).  You may remove them at night for sleeping.   Weight bearing as tolerated   Complete by: As directed       Allergies as of 12/08/2023       Reactions   Penicillins Rash      Sulfa Antibiotics Rash   Codeine Nausea Only   Tramadol Nausea Only   Severe dizziness        Medication List     TAKE these medications    albuterol  108 (90 Base) MCG/ACT inhaler Commonly known as: VENTOLIN  HFA Inhale 2 puffs into the lungs every 6 (six) hours as needed for wheezing or shortness of breath.   APPLE CIDER VINEGAR PO Take 1,200 mg by mouth 2 (two) times daily.   aspirin  81 MG chewable tablet Chew 1 tablet (81 mg total) by mouth 2 (two) times daily for 21 days.   BERBERINE HCI PO Take 1,200 mg  by mouth 2 (two) times daily.   CHROMIUM PO Take 500 mg by mouth every evening.   empagliflozin  10 MG Tabs tablet Commonly known as: JARDIANCE  Take 1 tablet (10 mg total) by mouth daily before breakfast.   hydrochlorothiazide  25 MG tablet Commonly known as: HYDRODIURIL  TAKE 1 TABLET (25 MG TOTAL) BY MOUTH DAILY.   HYDROmorphone  2 MG tablet Commonly known as: DILAUDID  Take 1-2 tablets (2-4 mg total) by mouth every 6 (six) hours as needed for severe pain (pain score 7-10).   loratadine 10 MG tablet Commonly known as: CLARITIN Take 10 mg by mouth every evening.   metFORMIN  1000 MG tablet Commonly known as: GLUCOPHAGE  Take 1 tablet (1,000 mg total) by mouth 2 (two) times daily with a meal.   methocarbamol  500 MG tablet Commonly known as: ROBAXIN  Take 1 tablet (500 mg total) by mouth every 6 (six) hours as needed  for muscle spasms.   omeprazole  40 MG capsule Commonly known as: PRILOSEC Take 1 capsule (40 mg total) by mouth daily.   ondansetron  4 MG tablet Commonly known as: ZOFRAN  Take 1 tablet (4 mg total) by mouth every 6 (six) hours as needed for nausea.   rosuvastatin  20 MG tablet Commonly known as: CRESTOR  Take 1 tablet (20 mg total) by mouth daily.               Discharge Care Instructions  (From admission, onward)           Start     Ordered   12/08/23 0000  Weight bearing as tolerated        12/08/23 0755   12/08/23 0000  Change dressing       Comments: You may remove the bulky bandage (ACE wrap and gauze) two days after surgery. You will have an adhesive waterproof bandage underneath. Leave this in place until your first follow-up appointment.   12/08/23 0755            Follow-up Information     Liliane Rei, MD. Schedule an appointment as soon as possible for a visit in 2 week(s).   Specialty: Orthopedic Surgery Contact information: [address] [place] [address] [place] [address] [phone]                 Signed: R. Brinton Canavan, PA-C Orthopedic Surgery 12/08/2023, 2:59 PM

## 2023-12-08 NOTE — TOC Initial Note (Signed)
 Transition of Care [facility]) - Initial/Assessment Note    Patient Details  Name: Jordan Mcconnell MRN: [id number] Date of Birth: 1956-02-20  Transition of Care [facility] CM/SW Contact:    Levie Ream, RN Phone Number: 12/08/2023, 11:38 AM  Clinical Narrative:              Jordan Mcconnell w/ pt in room; pt says he lives at home w/ his wife Jordan Mcconnell [phone]); he plans to return at d/c; his wife will provide transportation; pt verified insurance/PCP; he denied SDOH risks; pt says he has cane, crutches, walker, shower chair, cpap; he does not have HH services, or home oxygen; no TOC needs.  Expected Discharge Plan: Home/Self Care Barriers to Discharge: No Barriers Identified   Patient Goals and CMS Choice Patient states their goals for this hospitalization and ongoing recovery are:: home CMS Medicare.gov Compare Post Acute Care list provided to:: Patient        Expected Discharge Plan and Services   Discharge Planning Services: CM Consult   Living arrangements for the past 2 months: Single Family Home Expected Discharge Date: 12/08/23               DME Arranged: N/A DME Agency: NA       HH Arranged: NA HH Agency: NA        Prior Living Arrangements/Services Living arrangements for the past 2 months: Single Family Home Lives with:: Spouse Patient language and need for interpreter reviewed:: Yes Do you feel safe going back to the place where you live?: Yes      Need for Family Participation in Patient Care: Yes (Comment) Care giver support system in place?: Yes (comment) Current home services: DME (cane, crutches, walker, shower chair, cpap) Criminal Activity/Legal Involvement Pertinent to Current Situation/Hospitalization: No - Comment as needed  Activities of Daily Living   ADL Screening (condition at time of admission) Independently performs ADLs?: Yes (appropriate for developmental age) Is the patient deaf or have difficulty hearing?: Yes Does the patient have  difficulty seeing, even when wearing glasses/contacts?: Yes Does the patient have difficulty concentrating, remembering, or making decisions?: Yes  Permission Sought/Granted Permission sought to share information with : Case Manager Permission granted to share information with : Yes, Verbal Permission Granted  Share Information with NAME: Case Manager     Permission granted to share info w Relationship: [name] (spouse) [phone]     Emotional Assessment Appearance:: Appears stated age Attitude/Demeanor/Rapport: Gracious Affect (typically observed): Accepting Orientation: : Oriented to Self, Oriented to Place, Oriented to Situation, Oriented to  Time Alcohol / Substance Use: Not Applicable Psych Involvement: No (comment)  Admission diagnosis:  Instability of knee joint, right [M25.361] Failed total knee arthroplasty (HCC) [T84.018A, Z96.659] Patient Active Problem List   Diagnosis Date Noted   Failed total knee arthroplasty (HCC) 12/07/2023   Asthma 11/21/2023   Preop pulmonary/respiratory exam 11/21/2023   Hyperlipidemia associated with type 2 diabetes mellitus (HCC) 01/27/2022   Insomnia 10/28/2021   Seasonal and perennial allergic rhinitis 06/18/2021   Multiple drug allergies 06/03/2021   Chronic rhinitis 06/03/2021   Erectile dysfunction 04/01/2021   OA (osteoarthritis) of hip 04/16/2019   Dysphagia 09/18/2018   Lung nodules 09/18/2018   Chest pain 07/11/2018   Dyspnea 06/21/2018   Physical exam 07/22/2017   Adjustment disorder 03/17/2017   Diabetes mellitus, type II (HCC) 07/29/2016   Chronic cough 07/07/2016   Irritable bowel syndrome with diarrhea 05/12/2016   Gastroesophageal reflux disease 05/12/2016   Back pain  08/06/2015   Obstructive sleep apnea    HTN (hypertension)    Morbid obesity (HCC)    PCP:  Jess Morita, MD Pharmacy:   [facility] [phone] - [place], [place] - [address] [place]  [address] Phone: [phone] Fax: [phone]  [facility] [address] [place] [address] Phone: [phone] Fax: [phone]     Social Drivers of Health (SDOH) Social History: SDOH Screenings   Food Insecurity: No Food Insecurity (12/08/2023)  Housing: Low Risk  (12/08/2023)  Transportation Needs: No Transportation Needs (12/08/2023)  Utilities: Not At Risk (12/08/2023)  Alcohol Screen: Low Risk  (10/07/2022)  Depression (PHQ2-9): High Risk (11/16/2023)  Financial Resource Strain: Low Risk  (07/28/2023)  Physical Activity: Insufficiently Active (07/28/2023)  Social Connections: Socially Integrated (12/07/2023)  Stress: Stress Concern Present (07/28/2023)  Tobacco Use: Low Risk  (12/07/2023)   SDOH Interventions: Food Insecurity Interventions: Intervention Not Indicated, Inpatient TOC Housing Interventions: Intervention Not Indicated, Inpatient TOC Transportation Interventions: Intervention Not Indicated, Inpatient TOC Utilities Interventions: Intervention Not Indicated, Inpatient TOC   Readmission Risk Interventions     No data to display

## 2023-12-08 NOTE — Care Management Obs Status (Signed)
 MEDICARE OBSERVATION STATUS NOTIFICATION   Patient Details  Name: Jordan Mcconnell MRN: [id number] Date of Birth: Jun 01, 1956   Medicare Observation Status Notification Given:  Yes    Levie Ream, RN 12/08/2023, 10:13 AM

## 2023-12-08 NOTE — Plan of Care (Signed)
   Problem: Activity: Goal: Risk for activity intolerance will decrease Outcome: Progressing   Problem: Pain Managment: Goal: General experience of comfort will improve and/or be controlled Outcome: Progressing   Problem: Safety: Goal: Ability to remain free from injury will improve Outcome: Progressing

## 2023-12-08 NOTE — Op Note (Signed)
 NAME: Jordan Mcconnell, Jordan Mcconnell MEDICAL RECORD NO: [id number] ACCOUNT NO: [id number] DATE OF BIRTH: May 27, 1956 FACILITY: [facility] LOCATION: [id number] PHYSICIAN: Henri Loft. Lenola Lockner, MD  Operative Report   DATE OF PROCEDURE: 12/07/2023  PREOPERATIVE DIAGNOSIS:  Unstable right total knee arthroplasty.  POSTOPERATIVE DIAGNOSIS:  Unstable right total knee arthroplasty.  PROCEDURE PERFORMED:  Right knee polyethylene revision.  SURGEON:  Henri Loft. Finleigh Cheong, MD  ASSISTANT:  Brinton Canavan, PA-C  ANESTHESIA:  General and adductor canal block.  ESTIMATED BLOOD LOSS:  25 mL  DRAINS:  None  TOURNIQUET TIME:  26 minutes at 300 mmHg  COMPLICATIONS:  None.  CONDITION:  Stable to recovery.  BRIEF CLINICAL NOTE:  The patient is a 68 year old male who had a right total knee arthroplasty done many years ago.  He presented recently with the knee becoming unstable and with recurrent effusions and pain.  Infection workup was negative.  Bone scan  showed no evidence of any prosthetic loosening.  It was felt he was developing fluid secondary to polyethylene wear from the instability.  He presents now for polyethylene versus total knee arthroplasty revision.  DESCRIPTION OF PROCEDURE:  After successful administration of adductor canal block and general anesthetic, a tourniquet was placed on his right thigh and right lower extremity, prepped and draped in the usual sterile fashion.  Extremities were wrapped in  Esmarch.  The tourniquet was inflated to 300 mmHg.  Midline incision was made with a 10 blade through the subcutaneous tissue to the level of the extensor mechanism.  Fresh blade was used to make a medial parapatellar arthrotomy.  Upon entering the  joint, there was minimal fluid present, but there is a lot of hypertrophic synovial tissue, which has some metal staining to it and also has the appearance of tissue associated with polyethylene wear.  Soft tissue of the proximal medial tibia was  subperiosteally elevated  to the joint line with a knife and into the semimembranosus bursa with a Cobb elevator.  Soft tissue laterally was elevated with attention being paid to avoiding the patellar tendon on the tibial tubercle.  A thorough synovectomy  was performed, starting medially, then coursing superiorly, then laterally, then inferiorly.  I was then able to evert the patella and flex the knee 90 degrees.  The patella had some cold flow with no evidence of any volumetric wear.  The metal parts  were in good position and are well aligned.  There was a fair amount of varus, valgus, and AP laxity with the tibia and femur.  The tibial polyethylene was removed.  It was a size 5, 10 mm thick Sigma Rotating Platform poly.  This was removed and metal  parts were thoroughly inspected and no evidence of any loosening.  We then did trials for the thicker insert and went to 12.5 mm first, which still had a tiny bit of AP laxity, so we went to 15, which had great stability throughout full range of motion.   The 15 mm posterior stabilized rotating platform Sigma insert for the size 5 was placed into the tibial tray and then the knee was reduced with outstanding stability.  Full extension was achieved with excellent varus, valgus, anterior and posterior  balance throughout full range of motion.  The wound was then copiously irrigated with saline solution and the arthrotomy closed with a running 0 Stratafix suture.  The tourniquet was released, total time of 26 minutes.  Minor bleeding was stopped with  cautery.  Flexion against gravity was about 130 degrees  with normal tracking of the patella.  Subcu was closed with interrupted 2-0 Vicryl and subcuticular closed with a running 4-0 Monocryl.  Incisions cleaned and dried and Dermabond applied.  A bulky  sterile dressing was applied.  He was awakened and transported to recovery in stable condition.  Note that the surgical assistance is medical necessity for this procedure to perform it in a  safe and expeditious manner.  Surgical assistance necessary for retraction of vital ligaments, neurovascular structures, and for proper positioning of the limb  for removal of the old implant and safe and accurate placement of the new implant.   SHW D: 12/07/2023 2:37:40 pm T: 12/08/2023 1:08:00 am  JOB: [id number]/ [id number]

## 2023-12-08 NOTE — Progress Notes (Signed)
 Subjective: 1 Day Post-Op Procedure(s) (LRB): Right knee polyethylene versus total knee arthroplasty revision (Right) Patient seen in rounds by Dr. France Ina. Patient is well, and has had no acute complaints or problems. Denies SOB or chest pain. Denies calf pain. Patient reports pain as moderate. We will start physical therapy today.  Objective: Vital signs in last 24 hours: Temp:  [97.5 F (36.4 C)-97.9 F (36.6 C)] 97.8 F (36.6 C) (06/05 0537) Pulse Rate:  [58-82] 65 (06/05 0537) Resp:  [14-21] 16 (06/05 0537) BP: (124-169)/(64-96) 124/65 (06/05 0537) SpO2:  [95 %-100 %] 97 % (06/05 0537) Weight:  [109 kg] 109 kg (06/04 1645)  Intake/Output from previous day:  Intake/Output Summary (Last 24 hours) at 12/08/2023 0751 Last data filed at 12/08/2023 0142 Gross per 24 hour  Intake 1695.7 ml  Output 800 ml  Net 895.7 ml     Intake/Output this shift: No intake/output data recorded.  Labs: Recent Labs    12/08/23 0352  HGB 14.4   Recent Labs    12/08/23 0352  WBC 12.6*  RBC 5.05  HCT 44.1  PLT 194   Recent Labs    12/08/23 0352  NA 133*  K 4.2  CL 101  CO2 22  BUN 14  CREATININE 0.51*  GLUCOSE 158*  CALCIUM  8.9   No results for input(s): "LABPT", "INR" in the last 72 hours.  Exam: General - Patient is Alert and Oriented Extremity - Neurologically intact Neurovascular intact Sensation intact distally Dorsiflexion/Plantar flexion intact Dressing - dressing C/D/I Motor Function - intact, moving foot and toes well on exam.  Past Medical History:  Diagnosis Date   Angiodysplasia of cecum    Arthritis    Asthma 11/21/2023   Cancer (HCC)    skin cancer on hand and head   Complication of anesthesia    one surgery pt. 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   Weight as of this encounter: 109 kg. Advance diet Up with therapy D/C IV fluids  Anticipated LOS equal to or greater than 2 midnights due to - Age 65 and older with one or more of the following:  - Obesity  - Expected need for hospital services (PT, OT, Nursing) required for safe  discharge  - Anticipated need for postoperative skilled nursing care or inpatient rehab  - Active co-morbidities: Diabetes OR   - Unanticipated findings during/Post Surgery: Slow post-op progression: GI, pain control, mobility  - Patient is a high risk of re-admission due to: None   DVT Prophylaxis - Aspirin  Weight bearing as tolerated.  Start physical therapy. Expected discharge home today pending progress and if meeting patient goals. Scheduled [facility] OPPT at [facility]. Follow-up in clinic in 2 weeks.  The PDMP database was reviewed today prior to any opioid medications being prescribed to this patient.  R. Brinton Canavan, PA-C  Orthopedic Surgery [phone] 12/08/2023, 7:51 AM

## 2023-12-08 NOTE — Evaluation (Signed)
 Physical Therapy Evaluation Patient Details Name: Jordan Mcconnell MRN: [id number] DOB: 1956-01-24 Today's Date: 12/08/2023  History of Present Illness  68 y.o. male admitted with unstable R TKA, s/p polyethylene revision 12/07/23. PMH: B TKA, L THA, OSA, DM, CAD, asthma.  Clinical Impression  Pt is mobilizing well, he ambulated 250' with RW, no loss of balance. Stair training completed. He demonstrates good understanding of HEP. He is ready to DC home from a PT standpoint.         If plan is discharge home, recommend the following: Assistance with cooking/housework;Assist for transportation   Can travel by private vehicle        Equipment Recommendations None recommended by PT  Recommendations for Other Services       Functional Status Assessment Patient has had a recent decline in their functional status and demonstrates the ability to make significant improvements in function in a reasonable and predictable amount of time.     Precautions / Restrictions Precautions Precautions: Knee Precaution Booklet Issued: Yes (comment) Recall of Precautions/Restrictions: Intact Precaution/Restrictions Comments: reviewed no pillow under knee Restrictions Weight Bearing Restrictions Per Provider Order: No Other Position/Activity Restrictions: WBAT      Mobility  Bed Mobility Overal bed mobility: Modified Independent             General bed mobility comments: HOB up, used rail    Transfers Overall transfer level: Modified independent Equipment used: Rolling walker (2 wheels)                    Ambulation/Gait Ambulation/Gait assistance: Modified independent (Device/Increase time) Gait Distance (Feet): 250 Feet Assistive device: Rolling walker (2 wheels) Gait Pattern/deviations: Decreased step length - left, Decreased step length - right, Step-through pattern Gait velocity: WFL     General Gait Details: steady, no loss of balance  Stairs Stairs: Yes Stairs  assistance: Supervision Stair Management: One rail Right, Forwards, Step to pattern Number of Stairs: 3 General stair comments: VCs sequencing  Wheelchair Mobility     Tilt Bed    Modified Rankin (Stroke Patients Only)       Balance Overall balance assessment: Modified Independent                                           Pertinent Vitals/Pain Pain Assessment Pain Assessment: 0-10 Pain Score: 6  Pain Location: R knee Pain Descriptors / Indicators: Spasm Pain Intervention(s): Limited activity within patient's tolerance, Monitored during session, Premedicated before session, Ice applied    Home Living Family/patient expects to be discharged to:: Private residence Living Arrangements: Spouse/significant other Available Help at Discharge: Family;Available 24 hours/day Type of Home: House Home Access: Stairs to enter Entrance Stairs-Rails: Right Entrance Stairs-Number of Steps: 6   Home Layout: One level Home Equipment: Agricultural consultant (2 wheels);Rollator (4 wheels);Cane - single point      Prior Function Prior Level of Function : Independent/Modified Independent             Mobility Comments: walked without AD, no falls in past 6 months       Extremity/Trunk Assessment   Upper Extremity Assessment Upper Extremity Assessment: Overall WFL for tasks assessed    Lower Extremity Assessment Lower Extremity Assessment: RLE deficits/detail RLE Deficits / Details: 5-80* AAROM R Knee, SLR at least 3/5, knee ext at least 3/5 RLE Sensation: WNL RLE Coordination: WNL  Cervical / Trunk Assessment Cervical / Trunk Assessment: Normal  Communication   Communication Communication: No apparent difficulties    Cognition Arousal: Alert Behavior During Therapy: WFL for tasks assessed/performed   PT - Cognitive impairments: No apparent impairments                         Following commands: Intact       Cueing       General  Comments      Exercises Total Joint Exercises Ankle Circles/Pumps: AROM, Both, 10 reps, Supine Quad Sets: AROM, Both, 5 reps, Supine Short Arc Quad: AROM, Right, 5 reps, Supine Heel Slides: AAROM, Right, 5 reps, Supine Hip ABduction/ADduction: AROM, 5 reps, Right, Supine Straight Leg Raises: AROM, Right, 5 reps, Supine Long Arc Quad: AROM, Right, 5 reps, Seated Knee Flexion: AROM, Right, 5 reps, Seated Goniometric ROM: 5-80* AAROM R knee   Assessment/Plan    PT Assessment All further PT needs can be met in the next venue of care  PT Problem List Decreased range of motion;Decreased strength       PT Treatment Interventions      PT Goals (Current goals can be found in the Care Plan section)  Acute Rehab PT Goals Patient Stated Goal: do yardwork PT Goal Formulation: All assessment and education complete, DC therapy    Frequency       Co-evaluation               AM-PAC PT "6 Clicks" Mobility  Outcome Measure Help needed turning from your back to your side while in a flat bed without using bedrails?: None Help needed moving from lying on your back to sitting on the side of a flat bed without using bedrails?: None Help needed moving to and from a bed to a chair (including a wheelchair)?: None Help needed standing up from a chair using your arms (e.g., wheelchair or bedside chair)?: None Help needed to walk in hospital room?: None Help needed climbing 3-5 steps with a railing? : None 6 Click Score: 24    End of Session Equipment Utilized During Treatment: Gait belt Activity Tolerance: Patient tolerated treatment well Patient left: in chair;with call bell/phone within reach;in CPM;with chair alarm set Nurse Communication: Mobility status      Time: 1610-9604 PT Time Calculation (min) (ACUTE ONLY): 28 min   Charges:   PT Evaluation $PT Eval Moderate Complexity: 1 Mod PT Treatments $Gait Training: 8-22 mins PT General Charges $$ ACUTE PT VISIT: 1 Visit          Daymon Evans PT 12/08/2023  Acute Rehabilitation Services  Office [phone]

## 2023-12-08 NOTE — Care Management Important Message (Signed)
 Important Message  Patient Details IM Letter mailed to Patient. Name: Jordan Mcconnell MRN: [id number] Date of Birth: Jul 10, 1955   Important Message Given:  Yes - Medicare IM     Curtiss Dowdy 12/08/2023, 11:11 AM

## 2023-12-08 NOTE — Telephone Encounter (Signed)
 Pharmacy Patient Advocate Encounter   Received notification from CoverMyMeds that prior authorization for ONDANSETRON  4MG  TABS is required/requested.   Insurance verification completed.   The patient is insured through [facility] ADVANTAGE/RX ADVANCE .   Per test claim: PA required; PA submitted to above mentioned insurance via Fax Key/confirmation #/EOC [id number] Status is pending

## 2023-12-08 NOTE — Progress Notes (Signed)
 Writer went in room and went over d/c instructions with pt and wife Clinical research associate answered questions and they had clear understanding

## 2023-12-09 ENCOUNTER — Other Ambulatory Visit (HOSPITAL_COMMUNITY): Payer: Self-pay

## 2023-12-09 ENCOUNTER — Telehealth: Payer: Self-pay | Admitting: *Deleted

## 2023-12-09 NOTE — Telephone Encounter (Signed)
 Pharmacy Patient Advocate Encounter  Received notification from [facility] ADVANTAGE/RX ADVANCE that Prior Authorization for Ondansetron HCl 4MG  tablets  has been DENIED.  Full denial letter will be uploaded to the media tab. See denial reason below.

## 2023-12-09 NOTE — Transitions of Care (Post Inpatient/ED Visit) (Signed)
   12/09/2023  Name: Jordan Mcconnell MRN: [id number] DOB: [birth year]  Today's TOC FU Call Status: Today's TOC FU Call Status:: Unsuccessful Call (1st Attempt) Unsuccessful Call (1st Attempt) Date: 12/09/23  Attempted to reach the patient regarding the most recent Inpatient visit.  Left HIPAA compliant voice message requesting call back   Follow Up Plan: Additional outreach attempts will be made to reach the patient to complete the Transitions of Care (Post Inpatient/ED visit) call.   Pls call/ message for questions,  Deja Kaigler Mckinney Mackenzy Eisenberg, RN, BSN, CCRN [employer]umnus RN Care Manager  Transitions of Care  [facility] - [facility] [phone]: direct office

## 2023-12-09 NOTE — Telephone Encounter (Signed)
 How would you like to proceed following denial

## 2023-12-11 NOTE — Telephone Encounter (Signed)
I did not prescribe this medication

## 2023-12-12 ENCOUNTER — Telehealth: Payer: Self-pay

## 2023-12-12 DIAGNOSIS — M25661 Stiffness of right knee, not elsewhere classified: Secondary | ICD-10-CM | POA: Diagnosis not present

## 2023-12-12 DIAGNOSIS — M25561 Pain in right knee: Secondary | ICD-10-CM | POA: Diagnosis not present

## 2023-12-12 NOTE — Telephone Encounter (Signed)
**Note De-identified  [place] Obfuscation** Please advise 

## 2023-12-12 NOTE — Transitions of Care (Post Inpatient/ED Visit) (Signed)
   12/12/2023  Name: Jordan Mcconnell MRN: [id number] DOB: [birth year]  Today's TOC FU Call Status: Today's TOC FU Call Status:: Unsuccessful Call (2nd Attempt) Unsuccessful Call (2nd Attempt) Date: 12/12/23  Attempted to reach the patient regarding the most recent Inpatient/ED visit.  Follow Up Plan: Additional outreach attempts will be made to reach the patient to complete the Transitions of Care (Post Inpatient/ED visit) call.    [name] J. [name] RN, MSN [name], [facility] RN Care Manager Direct Dial: [phone]  Fax: [phone] Website: [email or web address]

## 2023-12-13 ENCOUNTER — Telehealth: Payer: Self-pay

## 2023-12-13 DIAGNOSIS — M25561 Pain in right knee: Secondary | ICD-10-CM | POA: Insufficient documentation

## 2023-12-13 DIAGNOSIS — M25661 Stiffness of right knee, not elsewhere classified: Secondary | ICD-10-CM | POA: Insufficient documentation

## 2023-12-13 NOTE — Transitions of Care (Post Inpatient/ED Visit) (Signed)
   12/13/2023  Name: Jordan Mcconnell MRN: [id number] DOB: [birth year]  Today's TOC FU Call Status: Today's TOC FU Call Status:: Successful TOC FU Call Completed TOC FU Call Complete Date: 12/13/23 Patient's Name and Date of Birth confirmed.  Transition Care Management Follow-up Telephone Call How have you been since you were released from the hospital?: Better (patient reports that he is doing well, reports pain under control) Any questions or concerns?: No  Items Reviewed: Did you receive and understand the discharge instructions provided?: Yes Medications obtained,verified, and reconciled?: No (reports that he has all his medications and declined review.)  Home Care and Equipment/Supplies: Were Home Health Services Ordered?: Yes Has Agency set up a time to come to your home?: Yes  Placed call to patient and reviewed reason for call. Patient reports that he is doing well. Reports that he does not need to review his discharge instructions because his has already reviewed his instructions with many people. Patient reports that he has all his medications.  Reports that he has PT. Patient declines any needs for concerns today.    Denied to completed call. Denies any concerns Assessments not completed. Provided my contact information for patient to call me if needed.  Orpha Blade, RN, BSN, CEN [employer]- Transition of Care Team.  [facility] [phone]

## 2023-12-14 DIAGNOSIS — M25561 Pain in right knee: Secondary | ICD-10-CM | POA: Diagnosis not present

## 2023-12-14 DIAGNOSIS — M25661 Stiffness of right knee, not elsewhere classified: Secondary | ICD-10-CM | POA: Diagnosis not present

## 2023-12-16 DIAGNOSIS — M25561 Pain in right knee: Secondary | ICD-10-CM | POA: Diagnosis not present

## 2023-12-16 DIAGNOSIS — M25661 Stiffness of right knee, not elsewhere classified: Secondary | ICD-10-CM | POA: Diagnosis not present

## 2023-12-19 DIAGNOSIS — M25561 Pain in right knee: Secondary | ICD-10-CM | POA: Diagnosis not present

## 2023-12-19 DIAGNOSIS — M25661 Stiffness of right knee, not elsewhere classified: Secondary | ICD-10-CM | POA: Diagnosis not present

## 2023-12-21 DIAGNOSIS — M25561 Pain in right knee: Secondary | ICD-10-CM | POA: Diagnosis not present

## 2023-12-21 DIAGNOSIS — M25661 Stiffness of right knee, not elsewhere classified: Secondary | ICD-10-CM | POA: Diagnosis not present

## 2023-12-23 DIAGNOSIS — M25561 Pain in right knee: Secondary | ICD-10-CM | POA: Diagnosis not present

## 2023-12-23 DIAGNOSIS — M25661 Stiffness of right knee, not elsewhere classified: Secondary | ICD-10-CM | POA: Diagnosis not present

## 2023-12-26 DIAGNOSIS — M25661 Stiffness of right knee, not elsewhere classified: Secondary | ICD-10-CM | POA: Diagnosis not present

## 2023-12-26 DIAGNOSIS — M25561 Pain in right knee: Secondary | ICD-10-CM | POA: Diagnosis not present

## 2023-12-28 ENCOUNTER — Other Ambulatory Visit (HOSPITAL_COMMUNITY): Payer: Self-pay | Admitting: Orthopedic Surgery

## 2023-12-28 ENCOUNTER — Ambulatory Visit (HOSPITAL_COMMUNITY)
Admission: RE | Admit: 2023-12-28 | Discharge: 2023-12-28 | Disposition: A | Discharge status: Home / Self Care | Source: Ambulatory Visit | Attending: Vascular Surgery | Admitting: Vascular Surgery

## 2023-12-28 DIAGNOSIS — M25561 Pain in right knee: Secondary | ICD-10-CM | POA: Diagnosis not present

## 2023-12-28 DIAGNOSIS — M7989 Other specified soft tissue disorders: Secondary | ICD-10-CM

## 2023-12-28 DIAGNOSIS — M79604 Pain in right leg: Secondary | ICD-10-CM | POA: Diagnosis not present

## 2023-12-28 DIAGNOSIS — M25661 Stiffness of right knee, not elsewhere classified: Secondary | ICD-10-CM | POA: Diagnosis not present

## 2024-01-02 DIAGNOSIS — M25561 Pain in right knee: Secondary | ICD-10-CM | POA: Diagnosis not present

## 2024-01-02 DIAGNOSIS — M25661 Stiffness of right knee, not elsewhere classified: Secondary | ICD-10-CM | POA: Diagnosis not present

## 2024-01-03 DIAGNOSIS — Z5189 Encounter for other specified aftercare: Secondary | ICD-10-CM | POA: Diagnosis not present

## 2024-01-04 DIAGNOSIS — M25561 Pain in right knee: Secondary | ICD-10-CM | POA: Diagnosis not present

## 2024-01-04 DIAGNOSIS — M25661 Stiffness of right knee, not elsewhere classified: Secondary | ICD-10-CM | POA: Diagnosis not present

## 2024-01-20 ENCOUNTER — Encounter: Payer: Self-pay | Admitting: Advanced Practice Midwife

## 2024-01-25 ENCOUNTER — Other Ambulatory Visit: Payer: Self-pay | Admitting: Family Medicine

## 2024-01-28 ENCOUNTER — Other Ambulatory Visit: Payer: Self-pay | Admitting: Family Medicine

## 2024-02-08 ENCOUNTER — Other Ambulatory Visit: Payer: Self-pay | Admitting: Family Medicine

## 2024-02-08 DIAGNOSIS — E119 Type 2 diabetes mellitus without complications: Secondary | ICD-10-CM

## 2024-02-22 DIAGNOSIS — E113291 Type 2 diabetes mellitus with mild nonproliferative diabetic retinopathy without macular edema, right eye: Secondary | ICD-10-CM | POA: Diagnosis not present

## 2024-02-22 DIAGNOSIS — Z7984 Long term (current) use of oral hypoglycemic drugs: Secondary | ICD-10-CM | POA: Diagnosis not present

## 2024-02-22 LAB — HM DIABETES EYE EXAM

## 2024-03-01 ENCOUNTER — Telehealth: Payer: Self-pay | Admitting: Family Medicine

## 2024-03-01 NOTE — Telephone Encounter (Signed)
 Stamped and faxed

## 2024-03-01 NOTE — Telephone Encounter (Signed)
 Type of form received: Diabetes & Heart Care Chronic Special Needs Plan Application  Additional comments:   Received by: Fax  Form should be Faxed/mailed to: (address/ fax #) [phone]  Is patient requesting call for pickup: N/A  Form placed:  Labeled & placed in provider bin  Attach charge sheet.  Provider will determine charge.  Individual made aware of 3-5 business day turn around? N/A

## 2024-03-01 NOTE — Telephone Encounter (Signed)
 Form completed and returned to Tonya.  Will need office stamp prior to being faxed

## 2024-03-01 NOTE — Telephone Encounter (Signed)
 Placed in folder at nurse station

## 2024-03-16 ENCOUNTER — Encounter: Payer: Self-pay | Admitting: Family Medicine

## 2024-03-16 ENCOUNTER — Telehealth: Payer: Self-pay

## 2024-03-16 NOTE — Telephone Encounter (Signed)
 Obtained documents from up front  Placed in providers fold for review

## 2024-03-16 NOTE — Telephone Encounter (Signed)
 [facility] fax received for provider review and signature. Placed in provider bin at front desk

## 2024-03-19 ENCOUNTER — Ambulatory Visit: Admitting: Family Medicine

## 2024-03-19 ENCOUNTER — Encounter: Payer: Self-pay | Admitting: Family Medicine

## 2024-03-19 VITALS — BP 122/68 | HR 78 | Temp 97.8°F | Ht 69.0 in | Wt 245.4 lb

## 2024-03-19 DIAGNOSIS — R200 Anesthesia of skin: Secondary | ICD-10-CM

## 2024-03-19 DIAGNOSIS — E1169 Type 2 diabetes mellitus with other specified complication: Secondary | ICD-10-CM

## 2024-03-19 DIAGNOSIS — Z0184 Encounter for antibody response examination: Secondary | ICD-10-CM

## 2024-03-19 DIAGNOSIS — E785 Hyperlipidemia, unspecified: Secondary | ICD-10-CM | POA: Diagnosis not present

## 2024-03-19 DIAGNOSIS — Z7984 Long term (current) use of oral hypoglycemic drugs: Secondary | ICD-10-CM | POA: Diagnosis not present

## 2024-03-19 DIAGNOSIS — Z23 Encounter for immunization: Secondary | ICD-10-CM

## 2024-03-19 DIAGNOSIS — F419 Anxiety disorder, unspecified: Secondary | ICD-10-CM

## 2024-03-19 DIAGNOSIS — R202 Paresthesia of skin: Secondary | ICD-10-CM

## 2024-03-19 LAB — BASIC METABOLIC PANEL WITH GFR
BUN: 17 mg/dL (ref 6–23)
CO2: 26 meq/L (ref 19–32)
Calcium: 9.8 mg/dL (ref 8.4–10.5)
Chloride: 98 meq/L (ref 96–112)
Creatinine, Ser: 0.64 mg/dL (ref 0.40–1.50)
GFR: 97.88 mL/min (ref >60.00)
Glucose, Bld: 129 mg/dL — ABNORMAL HIGH (ref 70–99)
Potassium: 3.5 meq/L (ref 3.5–5.1)
Sodium: 135 meq/L (ref 135–145)

## 2024-03-19 LAB — HEMOGLOBIN A1C: Hgb A1c MFr Bld: 7 % — ABNORMAL HIGH (ref 4.6–6.5)

## 2024-03-19 MED ORDER — FLUOXETINE HCL 10 MG PO CAPS
10.0000 mg | ORAL_CAPSULE | Freq: Every day | ORAL | 3 refills | Status: DC
Start: 2024-03-19 — End: 2024-04-27

## 2024-03-19 NOTE — Patient Instructions (Signed)
 Follow up in 4 weeks to recheck mood We'll notify you of your lab results and make any changes if needed START the Fluoxetine  once daily for mood Try and monitor your hand numbness- if it only occurs in certain positions, how long it lasts, etc Call with any questions or concerns Stay Safe!  Stay Healthy! Hang in there!!!

## 2024-03-19 NOTE — Assessment & Plan Note (Signed)
 New.  Only occurring in R hand.  Resolves w/ position changes.  He has been under considerable stress and admits to muscle tightness/tension.  Encouraged him to monitor his sxs and see if they only occur w/ resting elbow on hard surface or when hand/arm is below a certain level.  Offered referral for EMG/nerve conduction study but pt declined at this time.  Wants to see if treating anxiety improves sxs.

## 2024-03-19 NOTE — Progress Notes (Signed)
   Subjective:    Patient ID: Jordan Mcconnell, male    DOB: 1956-03-27, 68 y.o.   MRN: [id number]  HPI DM- chronic problem, last A1C 6.5%  On Jardiance  10mg  daily, Metformin  1000mg  BID.  UTD on eye exam.  Due for foot exam.  UTD on microalbumin.  No CP, SOB.  + HA's.  No visual changes.  Denies abd pain, N/V.  R hand numbness- sxs are intermittent, tend to resolve w/ position changes.  He can't say for certain if sxs are related to resting arm at elbow.  Anxiety- pt has been estranged from daughters x3 yrs (since he remarried) and mom is having heart problems.  At this time, pt is having a hard time turning his brain off, difficulty sleeping.   Review of Systems For ROS see HPI     Objective:   Physical Exam Vitals reviewed.  Constitutional:      General: He is not in acute distress.    Appearance: Normal appearance. He is well-developed. He is obese. He is not ill-appearing.  HENT:     Head: Normocephalic and atraumatic.  Eyes:     Extraocular Movements: Extraocular movements intact.     Conjunctiva/sclera: Conjunctivae normal.     Pupils: Pupils are equal, round, and reactive to light.  Neck:     Thyroid : No thyromegaly.  Cardiovascular:     Rate and Rhythm: Normal rate and regular rhythm.     Pulses: Normal pulses.     Heart sounds: Normal heart sounds. No murmur heard. Pulmonary:     Effort: Pulmonary effort is normal. No respiratory distress.     Breath sounds: Normal breath sounds.  Abdominal:     General: Bowel sounds are normal. There is no distension.     Palpations: Abdomen is soft.  Musculoskeletal:     Cervical back: Normal range of motion and neck supple.     Right lower leg: No edema.     Left lower leg: No edema.  Lymphadenopathy:     Cervical: No cervical adenopathy.  Skin:    General: Skin is warm and dry.  Neurological:     General: No focal deficit present.     Mental Status: He is alert and oriented to person, place, and time.     Cranial Nerves:  No cranial nerve deficit.     Motor: No weakness.     Comments: Fine tremor of hands bilaterally  Psychiatric:        Mood and Affect: Mood normal.        Behavior: Behavior normal.           Assessment & Plan:

## 2024-03-19 NOTE — Assessment & Plan Note (Addendum)
 Weight is stable.  BMI 36.24 but coupled w/ DM, HTN, hyperlipidemia this qualifies as morbidly obese.  Stressed need for healthy diet and regular exercise.  Will continue to follow.

## 2024-03-19 NOTE — Assessment & Plan Note (Signed)
 New.  Pt has had ongoing issues w/ anxiety since he became estranged from his daughters 3 yrs ago.  He has been resistant to medications but today is agreeable to start low dose Fluoxetine  due to the impact that chronic stress has had on his health and sleep.  Will follow closely and titrate prn.

## 2024-03-19 NOTE — Assessment & Plan Note (Signed)
 Chronic problem.  Last A1C well controlled at 6.5%  On Jardiance  and Metformin  w/o difficulty.  UTD on eye exam.  Foot exam done today.  UTD on microalbumin.  Check labs.  Adjust meds prn

## 2024-03-20 ENCOUNTER — Ambulatory Visit: Payer: Self-pay | Admitting: Family Medicine

## 2024-03-20 LAB — VARICELLA ZOSTER ANTIBODY, IGG: Varicella IgG: 10.6 {s_co_ratio}

## 2024-03-20 NOTE — Progress Notes (Signed)
 Pt has reviewed labs via MyChart

## 2024-03-22 ENCOUNTER — Telehealth: Payer: Self-pay | Admitting: Family Medicine

## 2024-03-22 NOTE — Telephone Encounter (Signed)
 Form completed and returned to [place]

## 2024-03-22 NOTE — Telephone Encounter (Signed)
 Type of form received: Chronic Condition Verification Form  Additional comments:   Received by: Fax  Form should be Faxed/mailed to: (address/ fax #) [phone]  Is patient requesting call for pickup: N/A  Form placed:  Labeled & placed in provider bin  Attach charge sheet.  Provider will determine charge.  Individual made aware of 3-5 business day turn around? N/A

## 2024-03-22 NOTE — Telephone Encounter (Signed)
 Faxed and placed in scan bin

## 2024-03-22 NOTE — Telephone Encounter (Signed)
 Placed in PCP sign folder in POD A

## 2024-03-28 NOTE — Telephone Encounter (Signed)
 Form completed and returned to [place]

## 2024-03-29 NOTE — Telephone Encounter (Signed)
 Faxed and placed in scan bin

## 2024-04-27 ENCOUNTER — Ambulatory Visit (INDEPENDENT_AMBULATORY_CARE_PROVIDER_SITE_OTHER): Admitting: Family Medicine

## 2024-04-27 ENCOUNTER — Encounter: Payer: Self-pay | Admitting: Family Medicine

## 2024-04-27 VITALS — BP 128/74 | HR 75 | Temp 97.7°F | Resp 15 | Ht 69.0 in | Wt 253.0 lb

## 2024-04-27 DIAGNOSIS — F419 Anxiety disorder, unspecified: Secondary | ICD-10-CM

## 2024-04-27 MED ORDER — FLUOXETINE HCL 10 MG PO CAPS: 10.0000 mg | ORAL_CAPSULE | Freq: Two times a day (BID) | ORAL | 3 refills | Status: AC

## 2024-04-27 NOTE — Progress Notes (Signed)
   Subjective:    Patient ID: Jordan Mcconnell, male    DOB: 1955-12-20, 68 y.o.   MRN: [id number]  HPI Anxiety- at last visit we started Fluoxetine  daily.  Pt reports fewer bad dreams since starting medication.  Continues to be estranged from his daughters after getting remarried after wife's death.  He is open to increasing medication to BID.   Review of Systems For ROS see HPI     Objective:   Physical Exam Vitals reviewed.  Constitutional:      General: He is not in acute distress.    Appearance: Normal appearance. He is not ill-appearing.  HENT:     Head: Normocephalic and atraumatic.  Eyes:     Extraocular Movements: Extraocular movements intact.     Conjunctiva/sclera: Conjunctivae normal.  Skin:    General: Skin is warm and dry.  Neurological:     General: No focal deficit present.     Mental Status: He is alert and oriented to person, place, and time.  Psychiatric:        Mood and Affect: Mood normal.        Behavior: Behavior normal.        Thought Content: Thought content normal.           Assessment & Plan:

## 2024-04-27 NOTE — Patient Instructions (Signed)
 Follow up in 3 months to recheck BP, cholesterol, and sugar INCREASE the Fluoxetine  to twice daily Make sure you are doing things for you!  You deserve to be happy and enjoy things! Call with any questions or concerns Stay Safe!  Stay Healthy! Hang in there!!!

## 2024-04-29 NOTE — Assessment & Plan Note (Signed)
 Ongoing issue.  Some improvement since starting Fluoxetine  10mg  daily.  He notices less bad dreams.  Open to idea of increasing to BID dosing.  New prescription sent.  Will follow.

## 2024-05-01 DIAGNOSIS — L814 Other melanin hyperpigmentation: Secondary | ICD-10-CM | POA: Diagnosis not present

## 2024-05-01 DIAGNOSIS — D225 Melanocytic nevi of trunk: Secondary | ICD-10-CM | POA: Diagnosis not present

## 2024-05-01 DIAGNOSIS — L570 Actinic keratosis: Secondary | ICD-10-CM | POA: Diagnosis not present

## 2024-05-01 DIAGNOSIS — L821 Other seborrheic keratosis: Secondary | ICD-10-CM | POA: Diagnosis not present

## 2024-05-01 DIAGNOSIS — D492 Neoplasm of unspecified behavior of bone, soft tissue, and skin: Secondary | ICD-10-CM | POA: Diagnosis not present

## 2024-05-01 DIAGNOSIS — D0462 Carcinoma in situ of skin of left upper limb, including shoulder: Secondary | ICD-10-CM | POA: Diagnosis not present

## 2024-05-17 DIAGNOSIS — D0462 Carcinoma in situ of skin of left upper limb, including shoulder: Secondary | ICD-10-CM | POA: Diagnosis not present

## 2024-07-09 ENCOUNTER — Other Ambulatory Visit: Payer: Self-pay

## 2024-07-09 ENCOUNTER — Telehealth: Payer: Self-pay | Admitting: Family Medicine

## 2024-07-09 MED ORDER — OMEPRAZOLE 40 MG PO CPDR: 40.0000 mg | DELAYED_RELEASE_CAPSULE | Freq: Every day | ORAL | 0 refills | Status: AC

## 2024-07-09 NOTE — Telephone Encounter (Signed)
 Completed.

## 2024-07-09 NOTE — Telephone Encounter (Signed)
 Patient is requesting can you please send his medication to [facility], and not [facility] he also stated that he needs Omeprazole  40 mg

## 2024-07-26 ENCOUNTER — Other Ambulatory Visit: Payer: Self-pay | Admitting: Family Medicine

## 2024-07-30 ENCOUNTER — Ambulatory Visit: Admitting: Family Medicine

## 2024-08-02 ENCOUNTER — Encounter: Payer: Self-pay | Admitting: Family Medicine

## 2024-08-02 ENCOUNTER — Ambulatory Visit: Admitting: Family Medicine

## 2024-08-02 ENCOUNTER — Ambulatory Visit: Payer: Self-pay | Admitting: Family Medicine

## 2024-08-02 VITALS — BP 122/76 | HR 79 | Ht 69.0 in | Wt 256.4 lb

## 2024-08-02 DIAGNOSIS — E119 Type 2 diabetes mellitus without complications: Secondary | ICD-10-CM

## 2024-08-02 DIAGNOSIS — I10 Essential (primary) hypertension: Secondary | ICD-10-CM | POA: Diagnosis not present

## 2024-08-02 DIAGNOSIS — Z7984 Long term (current) use of oral hypoglycemic drugs: Secondary | ICD-10-CM

## 2024-08-02 DIAGNOSIS — E1169 Type 2 diabetes mellitus with other specified complication: Secondary | ICD-10-CM

## 2024-08-02 DIAGNOSIS — G4700 Insomnia, unspecified: Secondary | ICD-10-CM

## 2024-08-02 DIAGNOSIS — E785 Hyperlipidemia, unspecified: Secondary | ICD-10-CM

## 2024-08-02 LAB — CBC WITH DIFFERENTIAL/PLATELET
Basophils Absolute: 0 10*3/uL (ref 0.0–0.1)
Basophils Relative: 0.5 % (ref 0.0–3.0)
Eosinophils Absolute: 0.4 10*3/uL (ref 0.0–0.7)
Eosinophils Relative: 5.1 % — ABNORMAL HIGH (ref 0.0–5.0)
HCT: 48.5 % (ref 39.0–52.0)
Hemoglobin: 16.4 g/dL (ref 13.0–17.0)
Lymphocytes Relative: 28.1 % (ref 12.0–46.0)
Lymphs Abs: 2.3 10*3/uL (ref 0.7–4.0)
MCHC: 33.8 g/dL (ref 30.0–36.0)
MCV: 85.5 fl (ref 78.0–100.0)
Monocytes Absolute: 0.6 10*3/uL (ref 0.1–1.0)
Monocytes Relative: 7.3 % (ref 3.0–12.0)
Neutro Abs: 4.8 10*3/uL (ref 1.4–7.7)
Neutrophils Relative %: 59 % (ref 43.0–77.0)
Platelets: 197 10*3/uL (ref 150.0–400.0)
RBC: 5.67 Mil/uL (ref 4.22–5.81)
RDW: 14.3 % (ref 11.5–15.5)
WBC: 8.2 10*3/uL (ref 4.0–10.5)

## 2024-08-02 LAB — HEPATIC FUNCTION PANEL
ALT: 44 U/L (ref 3–53)
AST: 26 U/L (ref 5–37)
Albumin: 4.7 g/dL (ref 3.5–5.2)
Alkaline Phosphatase: 71 U/L (ref 39–117)
Bilirubin, Direct: 0.1 mg/dL (ref 0.1–0.3)
Total Bilirubin: 0.7 mg/dL (ref 0.2–1.2)
Total Protein: 7.4 g/dL (ref 6.0–8.3)

## 2024-08-02 LAB — LIPID PANEL
Cholesterol: 91 mg/dL (ref 28–200)
HDL: 29.1 mg/dL — ABNORMAL LOW
LDL Cholesterol: 29 mg/dL (ref 10–99)
NonHDL: 61.43
Total CHOL/HDL Ratio: 3
Triglycerides: 161 mg/dL — ABNORMAL HIGH (ref 10.0–149.0)
VLDL: 32.2 mg/dL (ref 0.0–40.0)

## 2024-08-02 LAB — BASIC METABOLIC PANEL WITH GFR
BUN: 18 mg/dL (ref 6–23)
CO2: 33 meq/L — ABNORMAL HIGH (ref 19–32)
Calcium: 10.1 mg/dL (ref 8.4–10.5)
Chloride: 97 meq/L (ref 96–112)
Creatinine, Ser: 0.73 mg/dL (ref 0.40–1.50)
GFR: 93.82 mL/min
Glucose, Bld: 127 mg/dL — ABNORMAL HIGH (ref 70–99)
Potassium: 4.1 meq/L (ref 3.5–5.1)
Sodium: 137 meq/L (ref 135–145)

## 2024-08-02 LAB — TSH: TSH: 2.38 u[IU]/mL (ref 0.35–5.50)

## 2024-08-02 LAB — HEMOGLOBIN A1C: Hgb A1c MFr Bld: 6.9 % — ABNORMAL HIGH (ref 4.6–6.5)

## 2024-08-02 MED ORDER — TRAZODONE HCL 50 MG PO TABS: 25.0000 mg | ORAL_TABLET | Freq: Every evening | ORAL | 3 refills | Status: AC | PRN

## 2024-08-02 NOTE — Patient Instructions (Signed)
 Schedule your complete physical in late May We'll notify you of your lab results and make any changes if needed START the Trazodone  nightly for sleep- start w/ 1/2 tab and increase to 1 tab nightly Continue to work on healthy diet and regular exercise- you can do it! Call with any questions or concerns Stay Safe!  Stay Healthy! HAPPY BIRTHDAY!!!

## 2024-08-02 NOTE — Progress Notes (Signed)
" ° °  Subjective:    Patient ID: Jordan Mcconnell, male    DOB: 07-10-1955, 69 y.o.   MRN: [id number]  HPI DM- chronic problem, on Jardiance  10mg  daily, Metformin  1000mg  BID.  UTD on foot exam, eye exam, microalbumin.  Denies symptomatic lows.  Hyperlipidemia- chronic problem, on Crestor  20mg  daily.  Denies abd pain, N/V.  HTN- chronic problem, on hydrochlorothiazide  25mg  daily.  Denies CP, SOB above baseline, visual changes, edema.  Insomnia- pt reports difficulty both falling asleep and staying asleep.  Is also having to wake 2-3x/night to urinate.  Pt has never been on medication for sleep- this makes him nervous   Review of Systems For ROS see HPI     Objective:   Physical Exam Vitals reviewed.  Constitutional:      General: He is not in acute distress.    Appearance: Normal appearance. He is well-developed. He is obese. He is not ill-appearing.  HENT:     Head: Normocephalic and atraumatic.  Eyes:     Extraocular Movements: Extraocular movements intact.     Conjunctiva/sclera: Conjunctivae normal.     Pupils: Pupils are equal, round, and reactive to light.  Neck:     Thyroid : No thyromegaly.  Cardiovascular:     Rate and Rhythm: Normal rate and regular rhythm.     Pulses: Normal pulses.     Heart sounds: Normal heart sounds. No murmur heard. Pulmonary:     Effort: Pulmonary effort is normal. No respiratory distress.     Breath sounds: Normal breath sounds.  Abdominal:     General: Bowel sounds are normal. There is no distension.     Palpations: Abdomen is soft.  Musculoskeletal:     Cervical back: Normal range of motion and neck supple.     Right lower leg: No edema.     Left lower leg: No edema.  Lymphadenopathy:     Cervical: No cervical adenopathy.  Skin:    General: Skin is warm and dry.  Neurological:     General: No focal deficit present.     Mental Status: He is alert and oriented to person, place, and time.     Cranial Nerves: No cranial nerve deficit.   Psychiatric:        Mood and Affect: Mood normal.        Behavior: Behavior normal.           Assessment & Plan:    "

## 2024-08-04 NOTE — Assessment & Plan Note (Signed)
 Chronic problem.  On Jardiance  and Metformin  w/o difficulty.  UTD on foot exam, eye exam, microalbumin.  Currently asymptomatic.  Check labs.  Adjust meds prn

## 2024-08-04 NOTE — Assessment & Plan Note (Signed)
 Chronic problem.  On hydrochlorothiazide  w/ good control.  Currently asymptomatic.  Check labs due to diuretic use but no anticipated med changes.

## 2024-08-04 NOTE — Assessment & Plan Note (Signed)
 Ongoing issue.  Difficulty both falling asleep and staying asleep.  Knows that some of it is anxiety related.  Some of it is nocturia.  Has been very hesitant to start sleep medication.  Open to idea of Trazodone .  Will follow.

## 2024-08-04 NOTE — Assessment & Plan Note (Signed)
 Chronic problem.  On Crestor 20mg  daily w/o difficulty.  Check labs.  Adjust meds prn

## 2024-08-09 ENCOUNTER — Ambulatory Visit: Payer: Self-pay

## 2024-08-09 ENCOUNTER — Encounter: Payer: Self-pay | Admitting: Family Medicine

## 2024-08-09 ENCOUNTER — Ambulatory Visit: Admitting: Family Medicine

## 2024-08-09 VITALS — BP 124/80 | HR 62 | Temp 98.2°F | Ht 69.0 in | Wt 253.4 lb

## 2024-08-09 DIAGNOSIS — R42 Dizziness and giddiness: Secondary | ICD-10-CM

## 2024-08-09 MED ORDER — MECLIZINE HCL 25 MG PO TABS: 25.0000 mg | ORAL_TABLET | Freq: Three times a day (TID) | ORAL | 0 refills | Status: AC | PRN

## 2024-08-09 MED ORDER — FLUTICASONE PROPIONATE 50 MCG/ACT NA SUSP: 2.0000 | Freq: Every day | NASAL | 6 refills | Status: AC

## 2024-08-09 MED ORDER — ONDANSETRON HCL 4 MG PO TABS: 4.0000 mg | ORAL_TABLET | Freq: Four times a day (QID) | ORAL | 0 refills | Status: AC | PRN

## 2024-08-09 MED ORDER — PREDNISONE 10 MG PO TABS: ORAL_TABLET | ORAL | 0 refills | Status: AC

## 2024-08-09 NOTE — Patient Instructions (Addendum)
 Follow up as needed or as scheduled START the Prednisone  as directed- 3 pills at the same time x3 days, then 2 pills at the same time x3 days, then 1 pill daily.  Take w/ food  ADD the Flonase - 2 sprays each nostril daily Drink LOTS of fluids CHANGE positions slowly to allow yourself time to adjust START the Meclizine  as needed for dizziness USE the Ondansetron  as needed for nausea Call with any questions or concerns Stay Safe!  Stay Healthy! Hang in there!!

## 2024-08-09 NOTE — Progress Notes (Signed)
" ° °  Subjective:    Patient ID: Jordan Mcconnell, male    DOB: 1956-02-17, 69 y.o.   MRN: [id number]  HPI Dizziness- last night bent over to look at tracks in the snow and when he stood back up he had the sensation of spinning and that he was going to 'black out'.  3 weeks ago was walking into a hockey game when he felt that he was going to pass out.  Sxs resolved after a few minutes of leaning against the wall.  Pt is eating and drinking normally.  Having frequent frontal HA's.  Taking Claritin  nightly.  Was previously on Singulair .  Will have sxs when lying in bed if he rolls over or turns head.     Review of Systems For ROS see HPI     Objective:   Physical Exam Vitals reviewed.  Constitutional:      General: He is not in acute distress.    Appearance: Normal appearance. He is not ill-appearing.  HENT:     Head: Normocephalic and atraumatic.     Right Ear: Tympanic membrane and ear canal normal.     Left Ear: Tympanic membrane and ear canal normal.     Nose: No congestion.     Comments: No TTP over frontal or maxillary sinuses Eyes:     Conjunctiva/sclera: Conjunctivae normal.     Pupils: Pupils are equal, round, and reactive to light.     Comments: 3 beats of vertical nystagmus when looking upward  Cardiovascular:     Rate and Rhythm: Normal rate and regular rhythm.  Pulmonary:     Effort: Pulmonary effort is normal. No respiratory distress.     Breath sounds: No wheezing.  Musculoskeletal:     Cervical back: Neck supple.  Lymphadenopathy:     Cervical: No cervical adenopathy.  Skin:    General: Skin is warm and dry.  Neurological:     General: No focal deficit present.     Mental Status: He is alert and oriented to person, place, and time.     Cranial Nerves: No cranial nerve deficit.     Motor: No weakness.     Gait: Gait normal.  Psychiatric:        Mood and Affect: Mood normal.        Behavior: Behavior normal.        Thought Content: Thought content normal.            Assessment & Plan:  Vertigo- new.  Pt's sxs are consistent w/ positional vertigo.  Sxs are worse when lying down, rolling over, or turning head.  No evidence of bacterial infxn so no need for abx.  Suspect inflammation.  Will start Prednisone  taper and flonase .  Meclizine  and Zofran  prn.  Reviewed supportive care and red flags that should prompt return.  Pt expressed understanding and is in agreement w/ plan.  "

## 2024-08-09 NOTE — Telephone Encounter (Signed)
FYI/ appt today

## 2024-08-09 NOTE — Telephone Encounter (Signed)
 FYI Only or Action Required?: FYI only for provider: appointment scheduled on 08/09/24.  Patient was last seen in primary care on 08/02/2024 by Mahlon Comer BRAVO, MD.  Called Nurse Triage reporting Dizziness.  Symptoms began yesterday.  Interventions attempted: Other: help sleeping med to see if made a difference.  Symptoms are: stable.  Triage Disposition: See Physician Within 24 Hours  Patient/caregiver understands and will follow disposition?: yes       Message from [place] sent at 08/09/2024 10:02 AM EST  Reason for Triage: Pt is light headed and dizzy when standing/bend over or when turning his head.   Reason for Disposition  [1] MODERATE dizziness (e.g., interferes with normal activities) AND [2] has NOT been evaluated by doctor (or NP/PA) for this  (Exception: Dizziness caused by heat exposure, sudden standing, or poor fluid intake.)  Answer Assessment - Initial Assessment Questions 1. DESCRIPTION: Describe your dizziness.     Feels like head is not with you/can get off balance when bending over  2. LIGHTHEADED: Do you feel lightheaded? (e.g., somewhat faint, woozy, weak upon standing)     Near syncopal episode 3 weeks ago  3. VERTIGO: Do you feel like either you or the room is spinning or tilting? (i.e., vertigo)     yes 4. SEVERITY: How bad is it?  Do you feel like you are going to faint? Can you stand and walk?     Sometimes  5. ONSET:  When did the dizziness begin?     24 hours ago  6. AGGRAVATING FACTORS: Does anything make it worse? (e.g., standing, change in head position)     Standing, turning head, supine  7. HEART RATE: Can you tell me your heart rate? How many beats in 15 seconds?  (Note: Not all patients can do this.)       68 bpm  8. CAUSE: What do you think is causing the dizziness? (e.g., decreased fluids or food, diarrhea, emotional distress, heat exposure, new medicine, sudden standing, vomiting; unknown)     Diarrhea from  Metformin  and Jardiance  - 9. RECURRENT SYMPTOM: Have you had dizziness before? If Yes, ask: When was the last time? What happened that time?     2 10. OTHER SYMPTOMS: Do you have any other symptoms? (e.g., fever, chest pain, vomiting, diarrhea, bleeding)       BP 124/68, headache-- help sleeping pill to see if sx would not happen and sx persisted  11. PREGNANCY: Is there any chance you are pregnant? When was your last menstrual period?       N/a  Protocols used: Dizziness - Lightheadedness-A-AH

## 2024-11-16 ENCOUNTER — Encounter: Admitting: Family Medicine

## 2024-11-20 ENCOUNTER — Ambulatory Visit

## 2024-11-22 ENCOUNTER — Encounter: Admitting: Family Medicine
